# Patient Record
Sex: Female | Born: 1945 | Race: White | Hispanic: No | Marital: Married | Smoking: Former smoker
Health system: Southern US, Community
[De-identification: ages and names within clinical notes are randomized; demographics above are authoritative.]

## PROBLEM LIST (undated history)

## (undated) DIAGNOSIS — I4891 Unspecified atrial fibrillation: Secondary | ICD-10-CM

## (undated) DIAGNOSIS — I10 Essential (primary) hypertension: Secondary | ICD-10-CM

## (undated) DIAGNOSIS — D219 Benign neoplasm of connective and other soft tissue, unspecified: Secondary | ICD-10-CM

## (undated) HISTORY — DX: Essential (primary) hypertension: I10

## (undated) HISTORY — PX: DILATION AND CURETTAGE OF UTERUS: SHX78

## (undated) HISTORY — PX: CHOLECYSTECTOMY: SHX55

## (undated) HISTORY — DX: Benign neoplasm of connective and other soft tissue, unspecified: D21.9

## (undated) HISTORY — PX: HYSTEROSCOPY: SHX211

---

## 1999-08-06 ENCOUNTER — Other Ambulatory Visit: Admission: RE | Admit: 1999-08-06 | Discharge: 1999-08-06 | Payer: Self-pay | Admitting: Obstetrics and Gynecology

## 2000-09-06 ENCOUNTER — Ambulatory Visit (HOSPITAL_COMMUNITY): Admission: RE | Admit: 2000-09-06 | Discharge: 2000-09-06 | Payer: Self-pay | Admitting: Obstetrics and Gynecology

## 2000-09-06 ENCOUNTER — Other Ambulatory Visit: Admission: RE | Admit: 2000-09-06 | Discharge: 2000-09-06 | Payer: Self-pay | Admitting: Obstetrics and Gynecology

## 2000-09-06 ENCOUNTER — Encounter: Payer: Self-pay | Admitting: Obstetrics and Gynecology

## 2001-09-11 ENCOUNTER — Other Ambulatory Visit: Admission: RE | Admit: 2001-09-11 | Discharge: 2001-09-11 | Payer: Self-pay | Admitting: Obstetrics and Gynecology

## 2001-09-11 ENCOUNTER — Ambulatory Visit (HOSPITAL_COMMUNITY): Admission: RE | Admit: 2001-09-11 | Discharge: 2001-09-11 | Payer: Self-pay | Admitting: Obstetrics and Gynecology

## 2001-09-11 ENCOUNTER — Encounter: Payer: Self-pay | Admitting: Obstetrics and Gynecology

## 2001-11-01 ENCOUNTER — Encounter: Payer: Self-pay | Admitting: Obstetrics and Gynecology

## 2001-11-01 ENCOUNTER — Encounter: Admission: RE | Admit: 2001-11-01 | Discharge: 2001-11-01 | Payer: Self-pay | Admitting: Obstetrics and Gynecology

## 2002-06-06 ENCOUNTER — Encounter (INDEPENDENT_AMBULATORY_CARE_PROVIDER_SITE_OTHER): Payer: Self-pay

## 2002-06-06 ENCOUNTER — Ambulatory Visit (HOSPITAL_COMMUNITY): Admission: RE | Admit: 2002-06-06 | Discharge: 2002-06-06 | Payer: Self-pay | Admitting: Obstetrics and Gynecology

## 2002-09-11 ENCOUNTER — Ambulatory Visit (HOSPITAL_COMMUNITY): Admission: RE | Admit: 2002-09-11 | Discharge: 2002-09-11 | Payer: Self-pay | Admitting: Obstetrics and Gynecology

## 2002-09-11 ENCOUNTER — Other Ambulatory Visit: Admission: RE | Admit: 2002-09-11 | Discharge: 2002-09-11 | Payer: Self-pay | Admitting: Obstetrics and Gynecology

## 2002-09-11 ENCOUNTER — Encounter: Payer: Self-pay | Admitting: Obstetrics and Gynecology

## 2004-01-12 ENCOUNTER — Other Ambulatory Visit: Admission: RE | Admit: 2004-01-12 | Discharge: 2004-01-12 | Payer: Self-pay | Admitting: Obstetrics and Gynecology

## 2004-01-12 ENCOUNTER — Ambulatory Visit (HOSPITAL_COMMUNITY): Admission: RE | Admit: 2004-01-12 | Discharge: 2004-01-12 | Payer: Self-pay | Admitting: Obstetrics and Gynecology

## 2005-01-17 ENCOUNTER — Ambulatory Visit (HOSPITAL_COMMUNITY): Admission: RE | Admit: 2005-01-17 | Discharge: 2005-01-17 | Payer: Self-pay | Admitting: Obstetrics and Gynecology

## 2005-01-17 ENCOUNTER — Other Ambulatory Visit: Admission: RE | Admit: 2005-01-17 | Discharge: 2005-01-17 | Payer: Self-pay | Admitting: Obstetrics and Gynecology

## 2006-01-18 ENCOUNTER — Other Ambulatory Visit: Admission: RE | Admit: 2006-01-18 | Discharge: 2006-01-18 | Payer: Self-pay | Admitting: Obstetrics and Gynecology

## 2006-01-18 ENCOUNTER — Ambulatory Visit (HOSPITAL_COMMUNITY): Admission: RE | Admit: 2006-01-18 | Discharge: 2006-01-18 | Payer: Self-pay | Admitting: Obstetrics and Gynecology

## 2007-01-23 ENCOUNTER — Ambulatory Visit (HOSPITAL_COMMUNITY): Admission: RE | Admit: 2007-01-23 | Discharge: 2007-01-23 | Payer: Self-pay | Admitting: Obstetrics and Gynecology

## 2007-01-23 ENCOUNTER — Other Ambulatory Visit: Admission: RE | Admit: 2007-01-23 | Discharge: 2007-01-23 | Payer: Self-pay | Admitting: Obstetrics and Gynecology

## 2008-02-04 ENCOUNTER — Other Ambulatory Visit: Admission: RE | Admit: 2008-02-04 | Discharge: 2008-02-04 | Payer: Self-pay | Admitting: Obstetrics and Gynecology

## 2008-02-04 ENCOUNTER — Ambulatory Visit (HOSPITAL_COMMUNITY): Admission: RE | Admit: 2008-02-04 | Discharge: 2008-02-04 | Payer: Self-pay | Admitting: Obstetrics and Gynecology

## 2009-02-04 ENCOUNTER — Ambulatory Visit: Payer: Self-pay | Admitting: Obstetrics and Gynecology

## 2009-02-04 ENCOUNTER — Ambulatory Visit (HOSPITAL_COMMUNITY): Admission: RE | Admit: 2009-02-04 | Discharge: 2009-02-04 | Payer: Self-pay | Admitting: Obstetrics and Gynecology

## 2009-02-04 ENCOUNTER — Other Ambulatory Visit: Admission: RE | Admit: 2009-02-04 | Discharge: 2009-02-04 | Payer: Self-pay | Admitting: Obstetrics and Gynecology

## 2009-02-04 ENCOUNTER — Encounter: Payer: Self-pay | Admitting: Obstetrics and Gynecology

## 2009-02-09 ENCOUNTER — Ambulatory Visit: Payer: Self-pay | Admitting: Obstetrics and Gynecology

## 2009-03-04 ENCOUNTER — Ambulatory Visit: Payer: Self-pay | Admitting: Obstetrics and Gynecology

## 2010-02-15 ENCOUNTER — Ambulatory Visit (HOSPITAL_COMMUNITY): Admission: RE | Admit: 2010-02-15 | Discharge: 2010-02-15 | Payer: Self-pay | Admitting: Obstetrics and Gynecology

## 2010-02-15 ENCOUNTER — Other Ambulatory Visit: Admission: RE | Admit: 2010-02-15 | Discharge: 2010-02-15 | Payer: Self-pay | Admitting: Obstetrics and Gynecology

## 2010-02-15 ENCOUNTER — Ambulatory Visit: Payer: Self-pay | Admitting: Obstetrics and Gynecology

## 2011-02-10 ENCOUNTER — Other Ambulatory Visit: Payer: Self-pay | Admitting: Obstetrics and Gynecology

## 2011-02-10 DIAGNOSIS — Z1231 Encounter for screening mammogram for malignant neoplasm of breast: Secondary | ICD-10-CM

## 2011-03-08 ENCOUNTER — Encounter (INDEPENDENT_AMBULATORY_CARE_PROVIDER_SITE_OTHER): Payer: 59 | Admitting: Obstetrics and Gynecology

## 2011-03-08 ENCOUNTER — Other Ambulatory Visit: Payer: Self-pay | Admitting: Obstetrics and Gynecology

## 2011-03-08 ENCOUNTER — Ambulatory Visit (HOSPITAL_COMMUNITY)
Admission: RE | Admit: 2011-03-08 | Discharge: 2011-03-08 | Disposition: A | Discharge status: Home / Self Care | Payer: 59 | Source: Ambulatory Visit | Attending: Obstetrics and Gynecology | Admitting: Obstetrics and Gynecology

## 2011-03-08 ENCOUNTER — Other Ambulatory Visit (HOSPITAL_COMMUNITY)
Admission: RE | Admit: 2011-03-08 | Discharge: 2011-03-08 | Disposition: A | Discharge status: Home / Self Care | Payer: 59 | Source: Ambulatory Visit | Attending: Obstetrics and Gynecology | Admitting: Obstetrics and Gynecology

## 2011-03-08 DIAGNOSIS — Z1231 Encounter for screening mammogram for malignant neoplasm of breast: Secondary | ICD-10-CM

## 2011-03-08 DIAGNOSIS — Z01419 Encounter for gynecological examination (general) (routine) without abnormal findings: Secondary | ICD-10-CM

## 2011-03-08 DIAGNOSIS — Z124 Encounter for screening for malignant neoplasm of cervix: Secondary | ICD-10-CM | POA: Insufficient documentation

## 2011-03-08 DIAGNOSIS — N644 Mastodynia: Secondary | ICD-10-CM

## 2011-03-09 ENCOUNTER — Ambulatory Visit
Admission: RE | Admit: 2011-03-09 | Discharge: 2011-03-09 | Disposition: A | Discharge status: Home / Self Care | Payer: 59 | Source: Ambulatory Visit | Attending: Obstetrics and Gynecology | Admitting: Obstetrics and Gynecology

## 2011-03-09 DIAGNOSIS — N644 Mastodynia: Secondary | ICD-10-CM

## 2011-04-08 NOTE — Op Note (Signed)
[facility]  Patient:    Kristina Martin, Kristina Martin Visit Number: [id number] MRN: [id number]          Service Type: DSU Location: [facility] Attending Physician:  Sharon Mt Dictated by:   Rande Brunt. Eda Paschal, M.D. Proc. Date: 06/06/02 Admit Date:  06/06/2002 Discharge Date: 06/06/2002                             Operative Report  PREOPERATIVE DIAGNOSIS:       Dysfunctional uterine bleeding, leiomyomata uteri.  POSTOPERATIVE DIAGNOSIS:      Dysfunctional uterine bleeding, leiomyomata uteri.  OPERATION:                    Hysteroscopy D&C.  SURGEON:                      Daniel L. Eda Paschal, M.D.  ANESTHESIA:                   General.  INDICATIONS:                  The patient is a 65 year old female who has continued to have menometrorrhagia.  She has been treated multiple times with Megace, with only short-term control.  She will hemorrhage for up 4-5 weeks with this.  She has become anemic as a result of this.  She is a smoker and therefore cannot take oral contraceptives.  She enters now for hysteroscopy D&C, with hopes of controlling the above.  FINDINGS:                     External vaginal exam is within normal limits. Cervix is clean.  Uterus is enlarged by almost 10-11 weeks size.  She has no significant decensus.  Adnexa are palpably normal.  At the time of hysteroscopic examination, there was no intrauterine cavitary defect.  At the top of the fundus you could see where one of the myomas is putting pressure on the endometrial cavity; but, there is not a true submucous myoma from the standpoint of it entering the cavity.  PROCEDURE:                    After adequate general anesthesia, the patient was placed in the dorsal lithotomy position; prepped and draped in the usual sterile manner.  A single-tooth tenaculum was placed in the anterior lip of the cervix.  The cervix was dilated to a #33 Pratt dilator.  A hysteroscopic examination  was done with the hysteroscopic resectoscope, using 3% Sorbitol to expand the intrauterine cavity, and then a camera for magnification.  The cavity was seen and there was no intracavitary pathology.  After this, an endometrial curettage was done, with just moderate tissue removed.  It was sent to pathology with tissue diagnosis.  The patient was re-hysteroscoped to be sure that all was okay, and it was.  Pictures were taken for documentation.  FLUID DEFICIT:                250 cc.  BLOOD LOSS:                   Less than 100 cc.  DISPOSITION:                  The patient tolerated the procedure well and left the operating room in satisfactory condition. Dictated by:  Daniel L. Eda Paschal, M.D. Attending Physician:  Sharon Mt DD:  06/06/02 TD:  06/11/02 Job: [phone] [id number]

## 2011-04-19 DIAGNOSIS — K921 Melena: Secondary | ICD-10-CM

## 2012-03-28 ENCOUNTER — Other Ambulatory Visit: Payer: Self-pay | Admitting: Obstetrics and Gynecology

## 2012-03-28 DIAGNOSIS — Z1231 Encounter for screening mammogram for malignant neoplasm of breast: Secondary | ICD-10-CM

## 2012-04-02 ENCOUNTER — Encounter: Payer: Self-pay | Admitting: Gynecology

## 2012-04-02 DIAGNOSIS — I10 Essential (primary) hypertension: Secondary | ICD-10-CM | POA: Insufficient documentation

## 2012-04-02 DIAGNOSIS — D219 Benign neoplasm of connective and other soft tissue, unspecified: Secondary | ICD-10-CM | POA: Insufficient documentation

## 2012-04-11 ENCOUNTER — Ambulatory Visit (INDEPENDENT_AMBULATORY_CARE_PROVIDER_SITE_OTHER): Payer: Medicare Other | Admitting: Obstetrics and Gynecology

## 2012-04-11 ENCOUNTER — Encounter: Payer: Self-pay | Admitting: Obstetrics and Gynecology

## 2012-04-11 VITALS — BP 124/76 | Ht 60.5 in | Wt 137.0 lb

## 2012-04-11 DIAGNOSIS — N952 Postmenopausal atrophic vaginitis: Secondary | ICD-10-CM

## 2012-04-11 DIAGNOSIS — D219 Benign neoplasm of connective and other soft tissue, unspecified: Secondary | ICD-10-CM

## 2012-04-11 DIAGNOSIS — K625 Hemorrhage of anus and rectum: Secondary | ICD-10-CM

## 2012-04-11 DIAGNOSIS — D259 Leiomyoma of uterus, unspecified: Secondary | ICD-10-CM

## 2012-04-11 DIAGNOSIS — R350 Frequency of micturition: Secondary | ICD-10-CM

## 2012-04-11 DIAGNOSIS — K432 Incisional hernia without obstruction or gangrene: Secondary | ICD-10-CM

## 2012-04-11 NOTE — Patient Instructions (Signed)
Schedule colonoscopy. Schedule mammogram.

## 2012-04-11 NOTE — Progress Notes (Signed)
Patient came back to see me today for further followup. We are watching her with fibroids which have been as large as 12 week size. Now that she is menopausal she is having no pain or abnormal bleeding as she was previously. She was previously having menorrhagia with anemia. She is having some vaginal dryness but uses a lubricant with intercourse and does not feel she needs vaginal estrogen. Last year her Hemoccult stools test was positive for blood and I asked her  to go see Dr. Marina Goodell for colonoscopy. She never went because she was afraid she has cancer. She has not seen any rectal bleeding grossly. She is still aware of her incisional hernia but does not want it fixed. She had a normal bone density in March of 2010. She is due for her yearly mammogram. She is having urinary frequency without dysuria, urgency, or incontinence. She is having no dyspareunia.  ROS: 12 system review done. Pertinent positives above. Only other positives hypertension.  Physical examination: Selena Batten gardner present. HEENT within normal limits. Neck: Thyroid not large. No masses. Supraclavicular nodes: not enlarged. Breasts: Examined in both sitting and lying  position. No skin changes and no masses. Abdomen: Soft no guarding rebound or masses. Incisional hernia at site of gallbladder surgery. Pelvic: External: Within normal limits. BUS: Within normal limits. Vaginal:within normal limits. poor estrogen effect. No evidence of cystocele rectocele or enterocele. Cervix: clean. Uterus: Normal size and shape. Adnexa: No masses. Rectovaginal exam: Confirmatory and negative. Extremities: Within normal limits.  Assessment: #1. Incisional hernia #2. Microscopic blood in stools #3. Fibroids resolved #4. Atrophic vaginitis  Plan: Mammogram. Once again strongly urged her to get a colonoscopy. Discussed precancerous polyps, colon cancer. Call when necessary need for vaginal estrogen. Discussed incarcerated hernias and suggested she get a  surgical opinion.

## 2012-04-12 LAB — URINALYSIS W MICROSCOPIC + REFLEX CULTURE
Bilirubin Urine: NEGATIVE
Casts: NONE SEEN
Crystals: NONE SEEN
Ketones, ur: NEGATIVE mg/dL
Squamous Epithelial / LPF: NONE SEEN
Urobilinogen, UA: 0.2 mg/dL (ref 0.0–1.0)
pH: 6.5 (ref 5.0–8.0)

## 2012-04-23 ENCOUNTER — Ambulatory Visit (HOSPITAL_COMMUNITY): Payer: 59

## 2012-04-30 ENCOUNTER — Ambulatory Visit (HOSPITAL_COMMUNITY): Payer: 59

## 2012-05-07 ENCOUNTER — Ambulatory Visit (HOSPITAL_COMMUNITY)
Admission: RE | Admit: 2012-05-07 | Discharge: 2012-05-07 | Disposition: A | Discharge status: Home / Self Care | Payer: Medicare Other | Source: Ambulatory Visit | Attending: Obstetrics and Gynecology | Admitting: Obstetrics and Gynecology

## 2012-05-07 DIAGNOSIS — Z1231 Encounter for screening mammogram for malignant neoplasm of breast: Secondary | ICD-10-CM | POA: Insufficient documentation

## 2013-04-30 ENCOUNTER — Other Ambulatory Visit: Payer: Self-pay | Admitting: Women's Health

## 2013-04-30 DIAGNOSIS — Z1231 Encounter for screening mammogram for malignant neoplasm of breast: Secondary | ICD-10-CM

## 2013-05-15 ENCOUNTER — Encounter: Payer: Self-pay | Admitting: Women's Health

## 2013-05-15 ENCOUNTER — Ambulatory Visit (INDEPENDENT_AMBULATORY_CARE_PROVIDER_SITE_OTHER): Payer: Medicare Other | Admitting: Women's Health

## 2013-05-15 ENCOUNTER — Other Ambulatory Visit (HOSPITAL_COMMUNITY)
Admission: RE | Admit: 2013-05-15 | Discharge: 2013-05-15 | Disposition: A | Discharge status: Home / Self Care | Payer: Medicare Other | Source: Ambulatory Visit | Attending: Gynecology | Admitting: Gynecology

## 2013-05-15 ENCOUNTER — Ambulatory Visit (HOSPITAL_COMMUNITY)
Admission: RE | Admit: 2013-05-15 | Discharge: 2013-05-15 | Disposition: A | Discharge status: Home / Self Care | Payer: Medicare Other | Source: Ambulatory Visit | Attending: Women's Health | Admitting: Women's Health

## 2013-05-15 VITALS — BP 124/80 | Ht 62.0 in | Wt 132.0 lb

## 2013-05-15 DIAGNOSIS — Z124 Encounter for screening for malignant neoplasm of cervix: Secondary | ICD-10-CM | POA: Insufficient documentation

## 2013-05-15 DIAGNOSIS — M949 Disorder of cartilage, unspecified: Secondary | ICD-10-CM

## 2013-05-15 DIAGNOSIS — R350 Frequency of micturition: Secondary | ICD-10-CM

## 2013-05-15 DIAGNOSIS — M899 Disorder of bone, unspecified: Secondary | ICD-10-CM

## 2013-05-15 DIAGNOSIS — Z1231 Encounter for screening mammogram for malignant neoplasm of breast: Secondary | ICD-10-CM | POA: Insufficient documentation

## 2013-05-15 NOTE — Patient Instructions (Addendum)
Health Recommendations for Postmenopausal Women Respected and ongoing research has looked at the most common causes of death, disability, and poor quality of life in postmenopausal women. The causes include heart disease, diseases of blood vessels, diabetes, depression, cancer, and bone loss (osteoporosis). Many things can be done to help lower the chances of developing these and other common problems: CARDIOVASCULAR DISEASE Heart Disease: A heart attack is a medical emergency. Know the signs and symptoms of a heart attack. Below are things women can do to reduce their risk for heart disease.   Do not smoke. If you smoke, quit.  Aim for a healthy weight. Being overweight causes many preventable deaths. Eat a healthy and balanced diet and drink an adequate amount of liquids.  Get moving. Make a commitment to be more physically active. Aim for 30 minutes of activity on most, if not all days of the week.  Eat for heart health. Choose a diet that is low in saturated fat and cholesterol and eliminate trans fat. Include whole grains, vegetables, and fruits. Read and understand the labels on food containers before buying.  Know your numbers. Ask your caregiver to check your blood pressure, cholesterol (total, HDL, LDL, triglycerides) and blood glucose. Work with your caregiver on improving your entire clinical picture.  High blood pressure. Limit or stop your table salt intake (try salt substitute and food seasonings). Avoid salty foods and drinks. Read labels on food containers before buying. Eating well and exercising can help control high blood pressure. STROKE  Stroke is a medical emergency. Stroke may be the result of a blood clot in a blood vessel in the brain or by a brain hemorrhage (bleeding). Know the signs and symptoms of a stroke. To lower the risk of developing a stroke:  Avoid fatty foods.  Quit smoking.  Control your diabetes, blood pressure, and irregular heart rate. THROMBOPHLEBITIS  (BLOOD CLOT) OF THE LEG  Becoming overweight and leading a stationary lifestyle may also contribute to developing blood clots. Controlling your diet and exercising will help lower the risk of developing blood clots. CANCER SCREENING  Breast Cancer: Take steps to reduce your risk of breast cancer.  You should practice "breast self-awareness." This means understanding the normal appearance and feel of your breasts and should include breast self-examination. Any changes detected, no matter how small, should be reported to your caregiver.  After age 40, you should have a clinical breast exam (CBE) every year.  Starting at age 40, you should consider having a mammogram (breast X-ray) every year.  If you have a family history of breast cancer, talk to your caregiver about genetic screening.  If you are at high risk for breast cancer, talk to your caregiver about having an MRI and a mammogram every year.  Intestinal or Stomach Cancer: Tests to consider are a rectal exam, fecal occult blood, sigmoidoscopy, and colonoscopy. Women who are high risk may need to be screened at an earlier age and more often.  Cervical Cancer:  Beginning at age 30, you should have a Pap test every 3 years as long as the past 3 Pap tests have been normal.  If you have had past treatment for cervical cancer or a condition that could lead to cancer, you need Pap tests and screening for cancer for at least 20 years after your treatment.  If you had a hysterectomy for a problem that was not cancer or a condition that could lead to cancer, then you no longer need Pap tests.    If you are between ages 65 and 70, and you have had normal Pap tests going back 10 years, you no longer need Pap tests.  If Pap tests have been discontinued, risk factors (such as a new sexual partner) need to be reassessed to determine if screening should be resumed.  Some medical problems can increase the chance of getting cervical cancer. In these  cases, your caregiver may recommend more frequent screening and Pap tests.  Uterine Cancer: If you have vaginal bleeding after reaching menopause, you should notify your caregiver.  Ovarian cancer: Other than yearly pelvic exams, there are no reliable tests available to screen for ovarian cancer at this time except for yearly pelvic exams.  Lung Cancer: Yearly chest X-rays can detect lung cancer and should be done on high risk women, such as cigarette smokers and women with chronic lung disease (emphysema).  Skin Cancer: A complete body skin exam should be done at your yearly examination. Avoid overexposure to the sun and ultraviolet light lamps. Use a strong sun block cream when in the sun. All of these things are important in lowering the risk of skin cancer. MENOPAUSE Menopause Symptoms: Hormone therapy products are effective for treating symptoms associated with menopause:  Moderate to severe hot flashes.  Night sweats.  Mood swings.  Headaches.  Tiredness.  Loss of sex drive.  Insomnia.  Other symptoms. Hormone replacement carries certain risks, especially in older women. Women who use or are thinking about using estrogen or estrogen with progestin treatments should discuss that with their caregiver. Your caregiver will help you understand the benefits and risks. The ideal dose of hormone replacement therapy is not known. The Food and Drug Administration (FDA) has concluded that hormone therapy should be used only at the lowest doses and for the shortest amount of time to reach treatment goals.  OSTEOPOROSIS Protecting Against Bone Loss and Preventing Fracture: If you use hormone therapy for prevention of bone loss (osteoporosis), the risks for bone loss must outweigh the risk of the therapy. Ask your caregiver about other medications known to be safe and effective for preventing bone loss and fractures. To guard against bone loss or fractures, the following is recommended:  If  you are less than age 50, take 1000 mg of calcium and at least 600 mg of Vitamin D per day.  If you are greater than age 50 but less than age 70, take 1200 mg of calcium and at least 600 mg of Vitamin D per day.  If you are greater than age 70, take 1200 mg of calcium and at least 800 mg of Vitamin D per day. Smoking and excessive alcohol intake increases the risk of osteoporosis. Eat foods rich in calcium and vitamin D and do weight bearing exercises several times a week as your caregiver suggests. DIABETES Diabetes Melitus: If you have Type I or Type 2 diabetes, you should keep your blood sugar under control with diet, exercise and recommended medication. Avoid too many sweets, starchy and fatty foods. Being overweight can make control more difficult. COGNITION AND MEMORY Cognition and Memory: Menopausal hormone therapy is not recommended for the prevention of cognitive disorders such as Alzheimer's disease or memory loss.  DEPRESSION  Depression may occur at any age, but is common in elderly women. The reasons may be because of physical, medical, social (loneliness), or financial problems and needs. If you are experiencing depression because of medical problems and control of symptoms, talk to your caregiver about this. Physical activity and   exercise may help with mood and sleep. Community and volunteer involvement may help your sense of value and worth. If you have depression and you feel that the problem is getting worse or becoming severe, talk to your caregiver about treatment options that are best for you. ACCIDENTS  Accidents are common and can be serious in the elderly woman. Prepare your house to prevent accidents. Eliminate throw rugs, place hand bars in the bath, shower and toilet areas. Avoid wearing high heeled shoes or walking on wet, snowy, and icy areas. Limit or stop driving if you have vision or hearing problems, or you feel you are unsteady with you movements and  reflexes. HEPATITIS C Hepatitis C is a type of viral infection affecting the liver. It is spread mainly through contact with blood from an infected person. It can be treated, but if left untreated, it can lead to severe liver damage over years. Many people who are infected do not know that the virus is in their blood. If you are a "baby-boomer", it is recommended that you have one screening test for Hepatitis C. IMMUNIZATIONS  Several immunizations are important to consider having during your senior years, including:   Tetanus, diptheria, and pertussis booster shot.  Influenza every year before the flu season begins.  Pneumonia vaccine.  Shingles vaccine.  Others as indicated based on your specific needs. Talk to your caregiver about these. Document Released: 12/30/2005 Document Revised: 10/24/2012 Document Reviewed: 08/25/2008 [facility] Patient Information 2014 [place], [place].  Colonoscopy  Please schedule!!!!! Vit D 2000 daily

## 2013-05-15 NOTE — Progress Notes (Signed)
Kristina Martin [birth year] [id number]    History:    The patient presents for breast and pelvic exam. Postmenopausal no bleeding/no HRT. Normal Pap and mammogram history. Normal DEXA 2010. T score -0.7 left hip,  AP spine 1.5. Quit smoking 2 years ago. Had pneumonia vaccine, not zostavac. Has never had a colonoscopy. Labs at primary care. History of fibroids.   Past medical history, past surgical history, family history and social history were all reviewed and documented in the EPIC chart. Retired Psychiatric nurse. Father, brother hypertension, mother osteoporosis. 32 year old grandson living with them. Has 2 living sons. One son drown when he was 84. Hysteroscopy for DUB and fibroid 2003. Cholecystectomy in 74, 1 C-section last delivery.  Exam:  Filed Vitals:   05/15/13 1044  BP: 124/80    General appearance:  Normal Head/Neck:  Normal, without cervical or supraclavicular adenopathy. Thyroid:  Symmetrical, normal in size, without palpable masses or nodularity. Respiratory  Effort:  Normal  Auscultation:  Clear without wheezing or rhonchi Cardiovascular  Auscultation:  Regular rate, without rubs, murmurs or gallops  Edema/varicosities:  Not grossly evident Abdominal  Soft,nontender, without masses, guarding or rebound.  Liver/spleen:  No organomegaly noted  Hernia:  None appreciated  Skin  Inspection:  Grossly normal  Palpation:  Grossly normal Neurologic/psychiatric  Orientation:  Normal with appropriate conversation.  Mood/affect:  Normal  Genitourinary    Breasts: Examined lying and sitting.     Right: Without masses, retractions, discharge or axillary adenopathy.     Left: Without masses, retractions, discharge or axillary adenopathy.   Inguinal/mons:  Normal without inguinal adenopathy  External genitalia:  Normal  BUS/Urethra/Skene's glands:  Normal  Bladder:  Normal  Vagina:  Normal  Cervix:  Normal  Uterus:   normal in size, shape and contour.  Midline and  mobile  Adnexa/parametria:     Rt: Without masses or tenderness.   Lt: Without masses or tenderness.  Anus and perineum: Normal  Digital rectal exam: Normal sphincter tone without palpated masses or tenderness  Assessment/Plan:  67 y.o. M. WF G3 P2 for breast and pelvic exam.  Hypertension-primary care manages labs and meds Normal postmenopausal exam on no HRT/no bleeding  Plan: Bone density, will schedule here. Reviewed importance of increasing regular exercise, vitamin D 2000 daily, home safety and fall prevention discussed. Pap, Pap normal 2012, new screening guidelines reviewed.SBE's, continue annual mammogram. Reviewed importance of screening colonoscopy, husband has seen Dr. Juanda Chance in the past, will schedule. Encouraged zostavac. Has had some increased urgency with frequency and without burning or pain. UA pending.    Harrington Challenger WHNP, 11:18 AM 05/15/2013

## 2013-05-15 NOTE — Addendum Note (Signed)
Addended by: Richardson Chiquito on: 05/15/2013 12:25 PM   Modules accepted: Orders

## 2013-05-16 ENCOUNTER — Encounter: Payer: Self-pay | Admitting: Obstetrics and Gynecology

## 2013-05-16 LAB — URINALYSIS W MICROSCOPIC + REFLEX CULTURE
Hgb urine dipstick: NEGATIVE
Protein, ur: NEGATIVE mg/dL
pH: 7 (ref 5.0–8.0)

## 2013-05-17 LAB — URINE CULTURE

## 2013-05-22 ENCOUNTER — Other Ambulatory Visit: Payer: Self-pay | Admitting: Gynecology

## 2013-05-22 DIAGNOSIS — M949 Disorder of cartilage, unspecified: Secondary | ICD-10-CM

## 2013-06-20 ENCOUNTER — Ambulatory Visit (INDEPENDENT_AMBULATORY_CARE_PROVIDER_SITE_OTHER): Payer: Medicare Other

## 2013-06-20 DIAGNOSIS — M8580 Other specified disorders of bone density and structure, unspecified site: Secondary | ICD-10-CM

## 2013-06-20 DIAGNOSIS — M949 Disorder of cartilage, unspecified: Secondary | ICD-10-CM

## 2013-06-20 DIAGNOSIS — M899 Disorder of bone, unspecified: Secondary | ICD-10-CM

## 2014-09-22 ENCOUNTER — Encounter: Payer: Self-pay | Admitting: Women's Health

## 2016-05-30 DIAGNOSIS — Z0000 Encounter for general adult medical examination without abnormal findings: Secondary | ICD-10-CM | POA: Diagnosis not present

## 2016-05-30 DIAGNOSIS — R509 Fever, unspecified: Secondary | ICD-10-CM | POA: Diagnosis not present

## 2016-06-02 DIAGNOSIS — B349 Viral infection, unspecified: Secondary | ICD-10-CM | POA: Diagnosis not present

## 2016-11-01 DIAGNOSIS — R5383 Other fatigue: Secondary | ICD-10-CM | POA: Diagnosis not present

## 2016-11-01 DIAGNOSIS — I10 Essential (primary) hypertension: Secondary | ICD-10-CM | POA: Diagnosis not present

## 2016-11-01 DIAGNOSIS — Z1211 Encounter for screening for malignant neoplasm of colon: Secondary | ICD-10-CM | POA: Diagnosis not present

## 2016-11-01 DIAGNOSIS — R7301 Impaired fasting glucose: Secondary | ICD-10-CM | POA: Diagnosis not present

## 2016-11-01 DIAGNOSIS — Z0000 Encounter for general adult medical examination without abnormal findings: Secondary | ICD-10-CM | POA: Diagnosis not present

## 2016-11-01 DIAGNOSIS — Z23 Encounter for immunization: Secondary | ICD-10-CM | POA: Diagnosis not present

## 2017-11-16 DIAGNOSIS — Z23 Encounter for immunization: Secondary | ICD-10-CM | POA: Diagnosis not present

## 2018-02-08 DIAGNOSIS — H40033 Anatomical narrow angle, bilateral: Secondary | ICD-10-CM | POA: Diagnosis not present

## 2018-02-08 DIAGNOSIS — H35341 Macular cyst, hole, or pseudohole, right eye: Secondary | ICD-10-CM | POA: Diagnosis not present

## 2018-02-08 DIAGNOSIS — H40013 Open angle with borderline findings, low risk, bilateral: Secondary | ICD-10-CM | POA: Diagnosis not present

## 2018-02-08 DIAGNOSIS — H25043 Posterior subcapsular polar age-related cataract, bilateral: Secondary | ICD-10-CM | POA: Diagnosis not present

## 2018-02-08 DIAGNOSIS — H2513 Age-related nuclear cataract, bilateral: Secondary | ICD-10-CM | POA: Diagnosis not present

## 2018-02-08 DIAGNOSIS — H25013 Cortical age-related cataract, bilateral: Secondary | ICD-10-CM | POA: Diagnosis not present

## 2018-05-30 DIAGNOSIS — R7301 Impaired fasting glucose: Secondary | ICD-10-CM | POA: Diagnosis not present

## 2018-05-30 DIAGNOSIS — I10 Essential (primary) hypertension: Secondary | ICD-10-CM | POA: Diagnosis not present

## 2018-05-30 DIAGNOSIS — E785 Hyperlipidemia, unspecified: Secondary | ICD-10-CM | POA: Diagnosis not present

## 2018-12-14 ENCOUNTER — Other Ambulatory Visit: Payer: Self-pay

## 2018-12-14 ENCOUNTER — Ambulatory Visit (HOSPITAL_COMMUNITY)
Admission: EM | Admit: 2018-12-14 | Discharge: 2018-12-14 | Disposition: A | Discharge status: Home / Self Care | Payer: PPO

## 2018-12-14 ENCOUNTER — Emergency Department (HOSPITAL_COMMUNITY): Payer: PPO

## 2018-12-14 ENCOUNTER — Emergency Department (HOSPITAL_COMMUNITY)
Admission: EM | Admit: 2018-12-14 | Discharge: 2018-12-15 | Disposition: A | Discharge status: Home / Self Care | Payer: PPO | Attending: Emergency Medicine | Admitting: Emergency Medicine

## 2018-12-14 ENCOUNTER — Encounter (HOSPITAL_COMMUNITY): Payer: Self-pay

## 2018-12-14 DIAGNOSIS — H1031 Unspecified acute conjunctivitis, right eye: Secondary | ICD-10-CM | POA: Diagnosis not present

## 2018-12-14 DIAGNOSIS — S0003XA Contusion of scalp, initial encounter: Secondary | ICD-10-CM | POA: Insufficient documentation

## 2018-12-14 DIAGNOSIS — Z7982 Long term (current) use of aspirin: Secondary | ICD-10-CM | POA: Diagnosis not present

## 2018-12-14 DIAGNOSIS — W19XXXA Unspecified fall, initial encounter: Secondary | ICD-10-CM

## 2018-12-14 DIAGNOSIS — Z79899 Other long term (current) drug therapy: Secondary | ICD-10-CM | POA: Insufficient documentation

## 2018-12-14 DIAGNOSIS — Y92000 Kitchen of unspecified non-institutional (private) residence as  the place of occurrence of the external cause: Secondary | ICD-10-CM | POA: Diagnosis not present

## 2018-12-14 DIAGNOSIS — Y999 Unspecified external cause status: Secondary | ICD-10-CM | POA: Diagnosis not present

## 2018-12-14 DIAGNOSIS — Z87891 Personal history of nicotine dependence: Secondary | ICD-10-CM | POA: Diagnosis not present

## 2018-12-14 DIAGNOSIS — Y9389 Activity, other specified: Secondary | ICD-10-CM | POA: Insufficient documentation

## 2018-12-14 DIAGNOSIS — I10 Essential (primary) hypertension: Secondary | ICD-10-CM | POA: Insufficient documentation

## 2018-12-14 DIAGNOSIS — S0990XA Unspecified injury of head, initial encounter: Secondary | ICD-10-CM | POA: Diagnosis not present

## 2018-12-14 DIAGNOSIS — W01198A Fall on same level from slipping, tripping and stumbling with subsequent striking against other object, initial encounter: Secondary | ICD-10-CM | POA: Insufficient documentation

## 2018-12-14 MED ORDER — FLUORESCEIN SODIUM 1 MG OP STRP
1.0000 | ORAL_STRIP | Freq: Once | OPHTHALMIC | Status: DC
  Filled 2018-12-14: qty 1

## 2018-12-14 NOTE — ED Provider Notes (Signed)
[facility]ENC[facility] DEPARTMENT Provider Note   CSN: [id number] Arrival date & time: 12/14/18  1747   History   Chief Complaint Chief Complaint  Patient presents with  . Fall    HPI Kristina Martin is a 73 y.o. female.  HPI   Kristina Martin is a 73 y.o. female with PMH of fibroids and hypertension who presents with concern for swelling and color change of the face after fall at home 2 days ago.  Patient reports that she went to kick a bottle that was on the ground out of her way in the kitchen and then fell forward striking her head on the sink.  States that she only hit her right frontal forehead on the sink no LOC.  No neck pain.  Able to ambulate without difficulty thereafter.  Noticed progressively worsening swelling.  Went to urgent care earlier today where she was evaluated and referred to the emergency department for CT scan.  She reports she has had a headache and has taken roughly 2000 to 3000 mg of Tylenol per day for the past 2 days.  She has had no vision changes or blurry vision.  I was initially swollen shut on the right because of swelling that drained from her upper forehead down but now her eyes open and she has no double vision.  Has noticed a small amount of crusting around her right eye.  Past Medical History:  Diagnosis Date  . Fibroid   . Hypertension     Patient Active Problem List   Diagnosis Date Noted  . Fibroid   . Hypertension     Past Surgical History:  Procedure Laterality Date  . CESAREAN SECTION    . CHOLECYSTECTOMY    . DILATION AND CURETTAGE OF UTERUS    . HYSTEROSCOPY       OB History    Gravida  3   Para  2   Term  2   Preterm      AB  1   Living  2     SAB  1   TAB      Ectopic      Multiple      Live Births               Home Medications    Prior to Admission medications   Medication Sig Start Date End Date Taking? Authorizing Provider  acetaminophen (TYLENOL) 325 MG tablet Take 650-975 mg  by mouth every 4 (four) hours as needed (pain).    Yes [provider]  aspirin 81 MG tablet Take 81 mg by mouth daily.   Yes [provider]  atorvastatin (LIPITOR) 10 MG tablet Take 10 mg by mouth daily.   Yes [provider]  Calcium Carbonate-Vitamin D (CALCIUM + D PO) Take 1 tablet by mouth daily.    Yes [provider]  Cholecalciferol (VITAMIN D PO) Take 1 tablet by mouth daily.    Yes [provider]  lisinopril-hydrochlorothiazide (PRINZIDE,ZESTORETIC) 20-25 MG tablet Take 0.5 tablets by mouth daily.   Yes [provider]  erythromycin ophthalmic ointment Place into the right eye 4 (four) times daily for 7 days. Place a 1/2 inch ribbon of ointment into the lower eyelid. 12/15/18 12/22/18  Yarelly Kuba, Rodena Goldmann, MD    Family History Family History  Problem Relation Age of Onset  . Osteoporosis Mother   . Hypertension Father   . Diabetes Brother   . Hypertension Brother   .  Hypertension Brother   . Heart disease Brother     Social History Social History   Tobacco Use  . Smoking status: Former Research scientist (life sciences)  . Smokeless tobacco: Never Used  Substance Use Topics  . Alcohol use: No  . Drug use: No     Allergies   Patient has no known allergies.   Review of Systems Review of Systems  Constitutional: Negative for chills and fever.  HENT: Positive for facial swelling. Negative for ear pain and sore throat.   Eyes: Positive for discharge and redness. Negative for photophobia, pain, itching and visual disturbance.  Respiratory: Negative for cough and shortness of breath.   Cardiovascular: Negative for chest pain and palpitations.  Gastrointestinal: Negative for abdominal pain and vomiting.  Genitourinary: Negative for dysuria and hematuria.  Musculoskeletal: Negative for arthralgias and back pain.  Skin: Negative for color change and rash.  Neurological: Positive for headaches. Negative for seizures, syncope and weakness.  All  other systems reviewed and are negative.    Physical Exam Updated Vital Signs BP (!) 164/98 (BP Location: Right Arm)   Pulse 80   Temp 98.1 F (36.7 C) (Oral)   Resp 16   SpO2 96%   Physical Exam Vitals signs and nursing note reviewed.  Constitutional:      General: She is not in acute distress.    Appearance: Normal appearance. She is well-developed. She is not ill-appearing.  HENT:     Head: Normocephalic and atraumatic.     Jaw: There is normal jaw occlusion.      Comments: There is scattered ecchymoses about the frontal forehead, periorbital tissues, and maxillary bilaterally.  No tenderness to palpation about the face with exception of overlying hematoma on frontal forehead as above.    Nose: Nose normal.     Mouth/Throat:     Lips: Pink.     Dentition: Normal dentition.  Eyes:     General: Vision grossly intact.     Extraocular Movements: Extraocular movements intact.     Conjunctiva/sclera:     Right eye: Right conjunctiva is injected. No chemosis or hemorrhage.    Pupils: Pupils are equal, round, and reactive to light.     Visual Fields: Right eye visual fields normal and left eye visual fields normal.     Comments: Full range of motion of both eyes.  No evidence for entrapment.  No pain with light reflex.  No amblyopia.  No hyphema or hypopyon.  Small amount of scleral injection and conjunctival erythema in the right eye.  Small to moderate amount of yellow crusting about the lid margins on the right.  Neck:     Musculoskeletal: Full passive range of motion without pain and neck supple. No spinous process tenderness or muscular tenderness.  Cardiovascular:     Rate and Rhythm: Normal rate and regular rhythm.     Heart sounds: No murmur.  Pulmonary:     Effort: Pulmonary effort is normal. No respiratory distress.     Breath sounds: Normal breath sounds.  Abdominal:     Palpations: Abdomen is soft.     Tenderness: There is no abdominal tenderness.  Skin:     General: Skin is warm and dry.  Neurological:     General: No focal deficit present.     Mental Status: She is alert and oriented to person, place, and time.     GCS: GCS eye subscore is 4. GCS verbal subscore is 5. GCS motor subscore is 6.  Cranial Nerves: Cranial nerves are intact.     Sensory: Sensation is intact.     Motor: Motor function is intact.     Coordination: Coordination is intact.  Psychiatric:        Behavior: Behavior is cooperative.      ED Treatments / Results  Labs (all labs ordered are listed, but only abnormal results are displayed) Labs Reviewed - No data to display  EKG None  Radiology Ct Head Wo Contrast  Result Date: 12/14/2018 CLINICAL DATA:  [place] 2 days ago. Facial swelling. EXAM: CT HEAD WITHOUT CONTRAST TECHNIQUE: Contiguous axial images were obtained from the base of the skull through the vertex without intravenous contrast. COMPARISON:  None. FINDINGS: Brain: The brain itself has a normal appearance without evidence of accelerated atrophy, old or acute infarction, mass lesion, hemorrhage, hydrocephalus or extra-axial collection. Vascular: There is atherosclerotic calcification of the major vessels at the base of the brain. Skull: No skull fracture Sinuses/Orbits: Sinuses are clear. Orbits are negative. There is periorbital facial soft tissue swelling right more than left. Other: Right forehead scalp hematoma measuring up to 3 cm. IMPRESSION: No intracranial abnormality. No skull fracture. Right forehead scalp hematoma. Electronically Signed   By: Nelson Chimes M.D.   On: 12/14/2018 18:55    Procedures Procedures (including critical care time)  Medications Ordered in ED Medications  acetaminophen (TYLENOL) tablet 650 mg (has no administration in time range)     Initial Impression / Assessment and Plan / ED Course  I have reviewed the triage vital signs and the nursing notes.  Pertinent labs & imaging results that were available during my care  of the patient were reviewed by me and considered in my medical decision making (see chart for details).      MDM:  Imaging: CT head shows no acute intracranial abnormality.  No skull fracture.  Right forehead scalp hematoma.  ED Provider Interpretation of EKG: None indicated at this time.  Labs: None indicated at this time.  On initial evaluation, patient appears stable. Afebrile and hemodynamically stable. Alert and oriented x4, pleasant, and cooperative.  Presents after a fall at home with her husband at the bedside as detailed above.  On exam, patient has scalp/frontal forehead hematoma with evidence for draining over the midface of this hematoma.  She has no tenderness palpation anywhere with exception of around the area of hematoma.  CT head ordered prior to my evaluation shows no acute pathology other than hematoma as detailed above.  She is not anticoagulated and takes only aspirin daily.  Has had headache at home but has no neurologic deficits at this time.  No evidence for intracranial bleed.  Neurovascularly intact in all 4 extremities.  No evidence for facial trauma otherwise and feel that CT head encompasses the area of her pathology.  Spoke to radiology physician on call who reevaluated CT head without contrast and felt that there was no evidence for pathology other than the hematoma noted to the level of the inferior orbit.  No indication that CT maxillofacial protocol would add anything.  On exam, patient has normal visual acuity and extraocular movements.  No other evidence for trauma to the eye.  She does have conjunctival injection and erythema and some mild periorbital edema on the right that is slightly worse than that on the left.  Suspect early conjunctivitis in the setting of edema and inability to drain.  She has no pain on palpation and no pain with range of motion of  her eyes.  No erythema in the periorbital tissue on the right.  Low suspicion for preseptal cellulitis or  orbital cellulitis.  No evidence for endophthalmitis.  Low suspicion for episcleritis or iritis.  Patient does not wear contact lenses.  No evidence for open globe or muscular/cranial nerve entrapment.  Given prescription for erythromycin ointment.  Patient was given p.o. Tylenol in the ED and counseled extensively on safe prominence of Tylenol in her AVS.  Counseled to follow-up with her PCP for reevaluation and was given a prescription for erythromycin at discharge.  Given return precautions.   The plan for this patient was discussed with Dr. Zenia Resides who voiced agreement and who oversaw evaluation and treatment of this patient.   The patient was fully informed and involved with the history taking, evaluation, workup including labs/images, and plan. The patient's concerns and questions were addressed to the patient's satisfaction and she expressed agreement with the plan to dc home.    Final Clinical Impressions(s) / ED Diagnoses   Final diagnoses:  Injury of head, initial encounter  Fall, initial encounter  Hematoma of frontal scalp, initial encounter  Acute conjunctivitis of right eye, unspecified acute conjunctivitis type    ED Discharge Orders         Ordered    erythromycin ophthalmic ointment  4 times daily     12/15/18 0003           Lorma Heater, Rodena Goldmann, MD 12/15/18 Larena Glassman    Lacretia Leigh, MD 12/17/18 1401

## 2018-12-14 NOTE — ED Triage Notes (Signed)
Pt sent to ER by Dr. Lanny Cramp, pt has significant swelling to face from fall two days ago. Needs CT scan. Pt and family agreeable to plan, left for ER

## 2018-12-14 NOTE — ED Provider Notes (Signed)
I saw and evaluated the patient, reviewed the resident's note and I agree with the findings and plan.  EKG: None Patient here complaining of facial pain and right scalp forehead hematoma after a fall several days ago.  She denies any visual complaints of.  No blurred vision or diplopia.  Had severe periorbital edema and ecchymosis which is resolving.  No drainage from her nose or ears.  Head CT was negative for intracranial process.  Her neurological signs are stable.  She will be discharged home   Lacretia Leigh, MD 12/14/18 2220

## 2018-12-14 NOTE — ED Triage Notes (Signed)
Pt her from urgent care to be seen for swelling and bruising to her face from a fall 2 days ago. A&Ox4 ambulatory with a steady gait.

## 2018-12-15 MED ORDER — ERYTHROMYCIN 5 MG/GM OP OINT: TOPICAL_OINTMENT | Freq: Four times a day (QID) | OPHTHALMIC | 0 refills | Status: AC

## 2018-12-15 MED ORDER — ACETAMINOPHEN 325 MG PO TABS
650.0000 mg | ORAL_TABLET | Freq: Once | ORAL | Status: AC
  Administered 2018-12-15: 650 mg via ORAL
  Filled 2018-12-15: qty 2

## 2018-12-15 NOTE — ED Notes (Signed)
Patient Alert and oriented to baseline. Stable and ambulatory to baseline. Patient verbalized understanding of the discharge instructions.  Patient belongings were taken by the patient.   

## 2018-12-15 NOTE — Discharge Instructions (Addendum)
You may take 500 to 1000 mg of acetaminophen (Tylenol) every 8 hours as needed for pain for the next 2 to 3 days until you are reevaluated by your primary care physician.  Do not exceed this amount.

## 2018-12-19 DIAGNOSIS — I10 Essential (primary) hypertension: Secondary | ICD-10-CM | POA: Diagnosis not present

## 2018-12-19 DIAGNOSIS — R319 Hematuria, unspecified: Secondary | ICD-10-CM | POA: Diagnosis not present

## 2018-12-19 DIAGNOSIS — F17200 Nicotine dependence, unspecified, uncomplicated: Secondary | ICD-10-CM | POA: Diagnosis not present

## 2019-09-26 DIAGNOSIS — H35373 Puckering of macula, bilateral: Secondary | ICD-10-CM | POA: Diagnosis not present

## 2019-09-26 DIAGNOSIS — H2513 Age-related nuclear cataract, bilateral: Secondary | ICD-10-CM | POA: Diagnosis not present

## 2019-09-26 DIAGNOSIS — H40013 Open angle with borderline findings, low risk, bilateral: Secondary | ICD-10-CM | POA: Diagnosis not present

## 2019-09-26 DIAGNOSIS — H40033 Anatomical narrow angle, bilateral: Secondary | ICD-10-CM | POA: Diagnosis not present

## 2019-10-08 NOTE — Progress Notes (Addendum)
[facility] Note  10/09/2019     CHIEF COMPLAINT Patient presents for Retina Evaluation   HISTORY OF PRESENT ILLNESS: Kristina Martin is a 73 y.o. female who presents to the clinic today for:   HPI    Retina Evaluation    In both eyes.  This started 6 months ago.  Associated Symptoms Negative for Flashes, Pain, Fever, Weight Loss, Scalp Tenderness, Redness, Floaters, Distortion, Photophobia, Jaw Claudication, Fatigue, Shoulder/Hip pain, Glare, Blind Spot and Trauma.  Context:  distance vision, mid-range vision and near vision.  Treatments tried include eye drops.  Response to treatment was mild improvement.  I, the attending physician,  performed the HPI with the patient and updated documentation appropriately.          Comments    Referral of Dr. Kathlen Martin for retina eval. 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 Pt states she "cannot read as good as she once did" , denies flashes, floaters and ocular pain. Pt reports she fell January 2020 hitting right side of her head, she developed an infection OD. Pt is using Brimonidine Bid OD.        Last edited by Kristina Caffey, MD on 10/10/2019  5:08 PM. (History)    pt states she was sent here by Dr. 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 brimonidine (ALPHAGAN) 0.2 % ophthalmic solution Place 1 drop into the right eye 2 (two) times daily.   No current facility-administered medications for this visit.  (Ophthalmic Drugs)   Current Outpatient Medications (Other)  Medication Sig  . acetaminophen (TYLENOL) 325 MG tablet Take 650-975 mg by mouth every 4 (four) hours as needed (pain).   Marland Kitchen aspirin 81 MG tablet Take 81 mg by mouth daily.  Marland Kitchen atorvastatin (LIPITOR) 10 MG tablet Take 10 mg by mouth daily.  . Calcium Carbonate-Vitamin D (CALCIUM + D PO) Take 1 tablet by mouth daily.   . Cholecalciferol (VITAMIN D PO) Take 1 tablet by mouth daily.   Marland Kitchen lisinopril-hydrochlorothiazide (PRINZIDE,ZESTORETIC) 20-25 MG tablet Take 0.5 tablets by mouth daily.   No current facility-administered medications for this visit.  (Other)      REVIEW OF SYSTEMS: ROS    Positive for: Eyes   Negative for: Constitutional, Gastrointestinal, Neurological, Skin, Genitourinary, Musculoskeletal, HENT, Endocrine, Cardiovascular, Respiratory, Psychiatric, Allergic/Imm, Heme/Lymph   Last edited by Kristina Jordan, LPN on 075-GRM  QA348G PM. (History)       ALLERGIES No Known Allergies  PAST MEDICAL HISTORY Past Medical History:  Diagnosis Date  . Fibroid   . Hypertension    Past Surgical History:  Procedure Laterality Date  . CESAREAN SECTION    . CHOLECYSTECTOMY    . DILATION AND CURETTAGE  OF UTERUS    . HYSTEROSCOPY      FAMILY HISTORY Family History  Problem Relation Age of Onset  . Osteoporosis Mother   . Hypertension Father   . Diabetes Brother   . Hypertension Brother   . Hypertension Brother   . Heart disease Brother     SOCIAL HISTORY Social History   Tobacco Use  . Smoking status: Current Every Day Smoker    Types: Cigarettes  . Smokeless tobacco: Never Used  Substance Use Topics  . Alcohol use: No  . Drug use: No         OPHTHALMIC EXAM:  Base Eye Exam    Visual  Acuity (Snellen - Linear)      Right Left   Dist cc 20/40 -1 20/60   Dist ph cc NI 20/50 -2   Correction: Glasses       Tonometry (Tonopen, 2:21 PM)      Right Left   Pressure 22 14       Pupils      Dark Light Shape React APD   Right 3 2 Round Brisk None   Left 3 2 Round Brisk None       Visual Fields (Counting fingers)      Left Right    Full Full       Extraocular Movement      Right Left    Full, Ortho Full, Ortho       Neuro/Psych    Oriented x3: Yes       Dilation    Both eyes: 1.0% Mydriacyl, 2.5% Phenylephrine @ 2:17 PM        Slit Lamp and Fundus Exam    Slit Lamp Exam      Right Left   Lids/Lashes Dermatochalasis - upper lid, Meibomian gland dysfunction Dermatochalasis - upper lid, Meibomian gland dysfunction   Conjunctiva/Sclera White and quiet White and quiet   Cornea Debris in tear film, Arcus Debris in tear film, Arcus   Anterior Chamber Deep and quiet Deep and quiet   Iris Round and dilated Round and dilated   Lens 3+ Nuclear sclerosis with brunescence, 2-3+ Cortical cataract, 1+ Posterior subcapsular cataract 3+ Nuclear sclerosis with brunescence, 3+ Cortical cataract, 1+ Posterior subcapsular cataract   Vitreous Vitreous syneresis Vitreous syneresis       Fundus Exam      Right Left   Disc Pink and Sharp, temporal PPP Pink and Sharp, temporal PPP   C/D Ratio 0.4 0.4   Macula Blunted foveal reflex, Epiretinal membrane, Retinal pigment epithelial mottling, central Cystic changes Blunted foveal reflex, +Epiretinal membrane with mild striae, Retinal pigment epithelial mottling, no heme   Vessels Vascular attenuation, Tortuous Vascular attenuation, Tortuous, AV crossing changes   Periphery Attached, mild, scattered peripheral drusen, pigmented CR scar at 1200, No RT/RD Attached             IMAGING AND PROCEDURES  Imaging and Procedures for @TODAY @  OCT, Retina - OU - Both Eyes       Right Eye Quality was good. Central Foveal Thickness:  432. Progression has no prior data. Findings include abnormal foveal contour, intraretinal fluid, epiretinal membrane, no SRF, macular pucker.   Left Eye Quality was borderline. Central Foveal Thickness: 372. Progression has no prior data. Findings include abnormal foveal contour, epiretinal membrane, macular pucker, no SRF, no IRF.   Notes *Images captured and stored on drive  Diagnosis / Impression:  OD: +ERM with foveoschisis OS: +ERM w/ pucker  Clinical management:  See below  Abbreviations: NFP - Normal foveal profile. CME - cystoid macular edema. PED - pigment epithelial detachment. IRF - intraretinal fluid. SRF - subretinal fluid. EZ - ellipsoid zone. ERM - epiretinal membrane. ORA - outer retinal atrophy. ORT - outer retinal tubulation. SRHM - subretinal hyper-reflective material                 ASSESSMENT/PLAN:    ICD-10-CM   1. Epiretinal membrane (ERM) of both eyes  H35.373   2. Retinal edema  H35.81 OCT, Retina - OU - Both Eyes  3. Essential hypertension  I10   4. Hypertensive retinopathy of both eyes  H35.033   5. 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 Epiretinal membrane, OU  - OD with foveoschisis, BCVA 20/40  - OS with pucker, BCVA 20/50  - The natural history, anatomy, potential for loss of vision, and treatment options including vitrectomy techniques and the complications of endophthalmitis, retinal detachment, vitreous hemorrhage, cataract progression and permanent vision loss discussed with the patient.  - no metamorphopsia  - pt reports decreased vision, but suspect cataracts may be greater source of symptoms than ERM  - may need PPV w/ membrane peel, but recommend cataract surgery first, for both possible visual improvement and improved visualization for PPV, if needed   - clear from a retina standpoint to proceed with cataract surgery when pt and surgeon are ready  - f/u 3  mos -- DFE/OCT  3,4. Hypertensive retinopathy OU  - discussed importance of tight BP control  - monitor  5. Mixed form age related cataract OU  - The symptoms of cataract, surgical options, and treatments and risks were discussed with patient.  - discussed diagnosis and progression  - under the expert management of Dr. Kathlen Martin  - clear from a retina standpoint to proceed with cataract surgery when pt and surgeon are ready  6. Ocular Hypertension   - IOP today: 22 OD, 14 OS  - on brimonidine BID OD only per Dr. Kathlen Martin   Ophthalmic Meds Ordered this visit:  No orders of the defined types were placed in this encounter.      Return in about 3 months (around 01/09/2020) for f/u 3 months ERM OU, DFE, OCT.  There are no Patient Instructions on file for this visit.   Explained the diagnoses, plan, and follow up with the patient and they expressed understanding.  Patient expressed understanding of the importance of proper follow up care.   This document serves as a record of services personally performed by Gardiner Sleeper, MD, PhD. It was created on their behalf by Roselee Nova, COMT. The creation of this record is the provider's dictation and/or activities during the visit.  Electronically signed by: Roselee Nova, COMT 10/10/19 5:19 PM   Gardiner Sleeper, M.D., Ph.D. Diseases & Surgery of the Retina and Vitreous [facility]  I have reviewed the above documentation for accuracy and completeness, and I agree with the above. Gardiner Sleeper, M.D., Ph.D. 10/10/19 5:19 PM    Abbreviations: M myopia (nearsighted); A astigmatism; H hyperopia (farsighted); P presbyopia; Mrx spectacle prescription;  CTL contact lenses; OD right eye; OS left eye; OU both eyes  XT exotropia; ET esotropia; PEK punctate epithelial keratitis; PEE punctate epithelial erosions; DES dry eye syndrome; MGD meibomian gland dysfunction; ATs artificial tears; PFAT's preservative free artificial  tears; [place] nuclear sclerotic cataract; PSC posterior subcapsular cataract; ERM epi-retinal membrane; PVD posterior vitreous detachment; RD retinal detachment; DM diabetes mellitus; DR diabetic retinopathy; NPDR non-proliferative diabetic retinopathy; PDR proliferative diabetic retinopathy; CSME clinically significant macular edema; DME diabetic macular edema; dbh dot blot hemorrhages; CWS cotton wool spot; POAG primary open angle glaucoma; C/D cup-to-disc ratio; HVF humphrey visual field; GVF goldmann visual field; OCT optical coherence tomography; IOP intraocular pressure; BRVO Branch retinal vein occlusion; CRVO central retinal vein occlusion; CRAO central retinal artery occlusion; BRAO branch retinal artery occlusion; RT retinal tear; SB scleral buckle; PPV pars plana vitrectomy; VH Vitreous hemorrhage; PRP panretinal laser photocoagulation; IVK intravitreal kenalog; VMT vitreomacular traction; MH Macular hole;  NVD neovascularization of the disc; NVE neovascularization elsewhere; AREDS age related eye disease study; ARMD age related macular degeneration; POAG primary open angle glaucoma; EBMD epithelial/anterior basement membrane dystrophy; ACIOL anterior chamber intraocular lens; IOL intraocular lens; PCIOL posterior chamber intraocular lens; Phaco/IOL phacoemulsification with intraocular lens placement; [place] photorefractive keratectomy; LASIK laser assisted in situ keratomileusis; HTN hypertension; DM diabetes mellitus; COPD chronic obstructive pulmonary disease

## 2019-10-09 ENCOUNTER — Other Ambulatory Visit: Payer: Self-pay

## 2019-10-09 ENCOUNTER — Encounter (INDEPENDENT_AMBULATORY_CARE_PROVIDER_SITE_OTHER): Payer: Self-pay | Admitting: Ophthalmology

## 2019-10-09 ENCOUNTER — Ambulatory Visit (INDEPENDENT_AMBULATORY_CARE_PROVIDER_SITE_OTHER): Payer: PPO | Admitting: Ophthalmology

## 2019-10-09 DIAGNOSIS — H40051 Ocular hypertension, right eye: Secondary | ICD-10-CM | POA: Diagnosis not present

## 2019-10-09 DIAGNOSIS — H25813 Combined forms of age-related cataract, bilateral: Secondary | ICD-10-CM

## 2019-10-09 DIAGNOSIS — H3581 Retinal edema: Secondary | ICD-10-CM | POA: Diagnosis not present

## 2019-10-09 DIAGNOSIS — H35033 Hypertensive retinopathy, bilateral: Secondary | ICD-10-CM | POA: Diagnosis not present

## 2019-10-09 DIAGNOSIS — H35373 Puckering of macula, bilateral: Secondary | ICD-10-CM | POA: Diagnosis not present

## 2019-10-09 DIAGNOSIS — I10 Essential (primary) hypertension: Secondary | ICD-10-CM | POA: Diagnosis not present

## 2019-10-10 ENCOUNTER — Encounter (INDEPENDENT_AMBULATORY_CARE_PROVIDER_SITE_OTHER): Payer: Self-pay | Admitting: Ophthalmology

## 2020-01-13 ENCOUNTER — Encounter (INDEPENDENT_AMBULATORY_CARE_PROVIDER_SITE_OTHER): Payer: PPO | Admitting: Ophthalmology

## 2020-07-09 DIAGNOSIS — I10 Essential (primary) hypertension: Secondary | ICD-10-CM | POA: Diagnosis not present

## 2020-07-09 DIAGNOSIS — Z1211 Encounter for screening for malignant neoplasm of colon: Secondary | ICD-10-CM | POA: Diagnosis not present

## 2020-09-14 DIAGNOSIS — Z23 Encounter for immunization: Secondary | ICD-10-CM | POA: Diagnosis not present

## 2020-10-05 IMAGING — CT CT HEAD W/O CM
4 series · 15 of 47 positions shown, 17 images · non-contrast
Comparison: None.

CLINICAL DATA: Fell 2 days ago. Facial swelling.

EXAM:
CT HEAD WITHOUT CONTRAST
TECHNIQUE: Contiguous axial images were obtained from the base of the skull
through the vertex without intravenous contrast.

[Series 3: head without · axial · non-contrast · 0.39mm/px · z∈[-41,+69]mm · 7 of 30 slices shown, 9 images]
[im 4/30  brain]
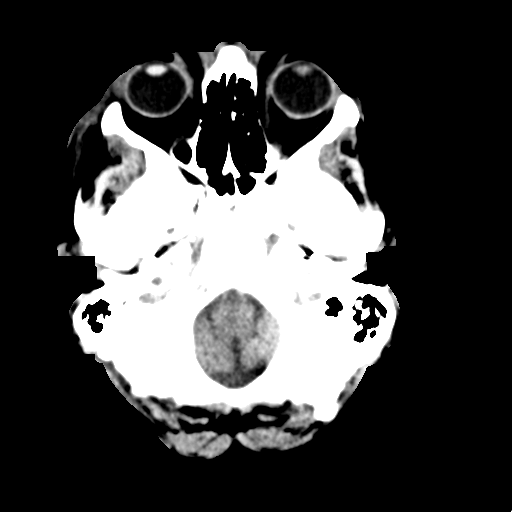
[im 4/30  bone]
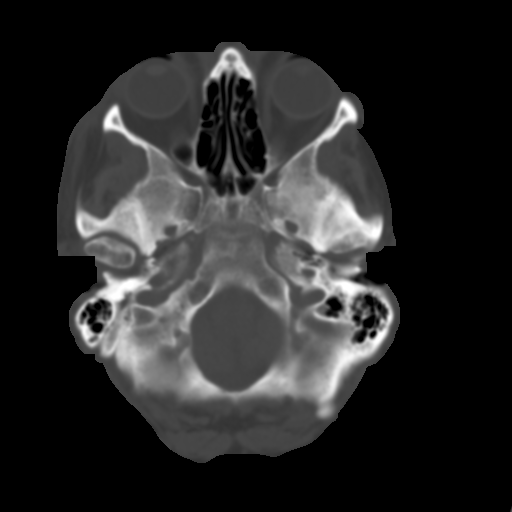
[im 8/30  brain]
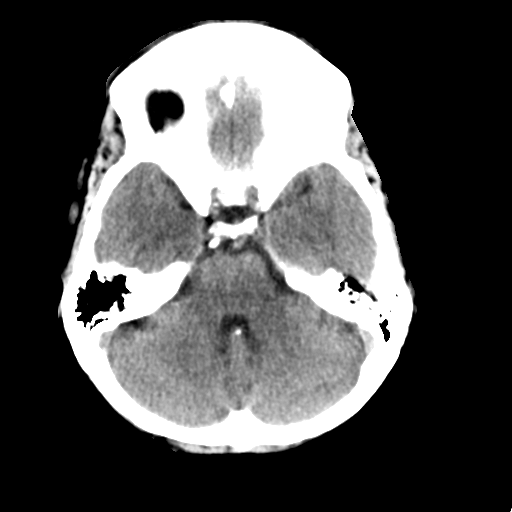
[im 11/30  brain]
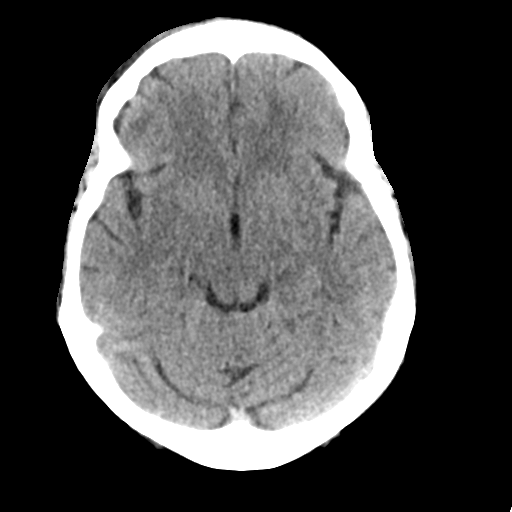
[im 15/30  brain]
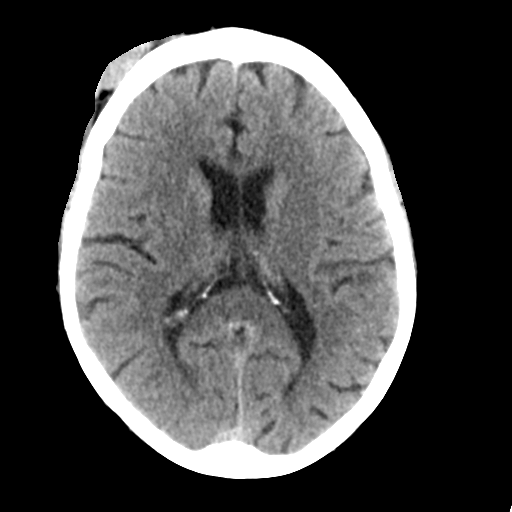
[im 19/30  brain]
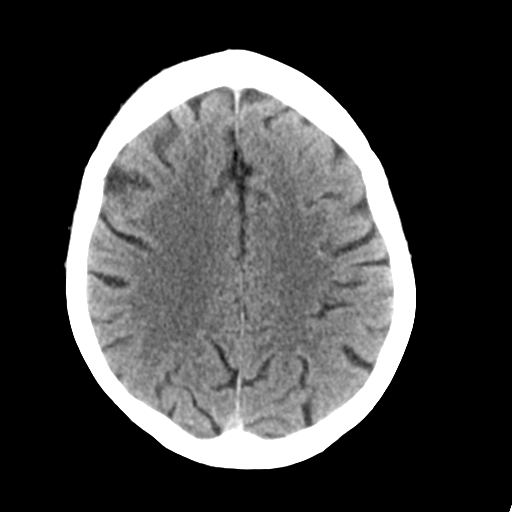
[im 19/30  bone]
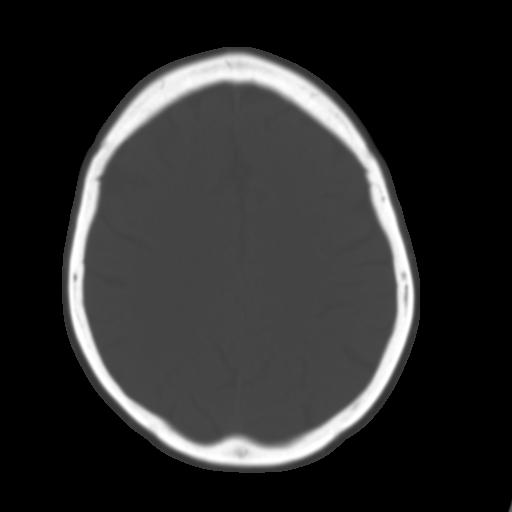
[im 22/30  brain]
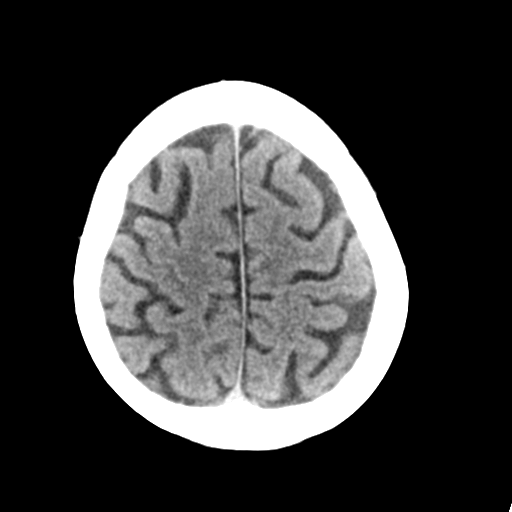
[im 26/30  brain]
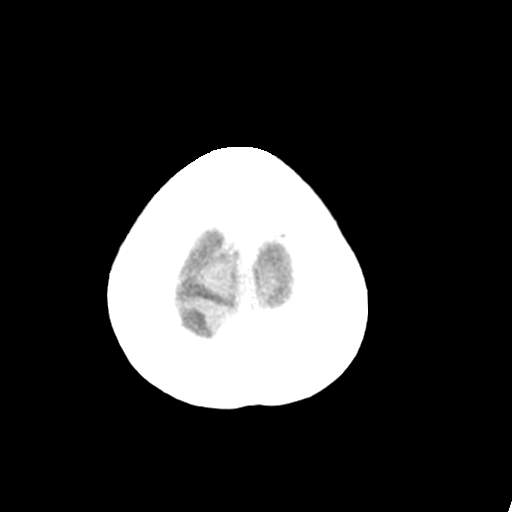

[Series 4: head bone · axial · 0.39mm/px · z∈[-42,-28]mm · 2 of 75 slices shown]
[im 8/75  bone]
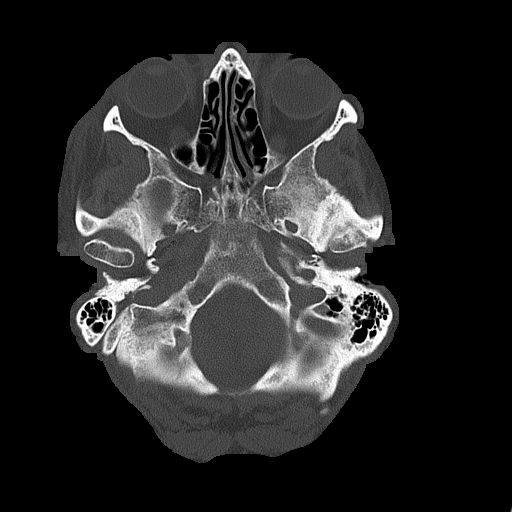
[im 15/75  bone]
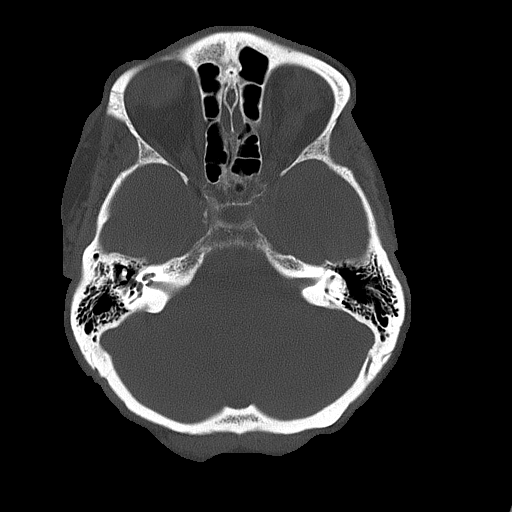

[Series 5: head without cor · coronal · non-contrast · 0.29mm/px · 3 of 69 slices shown]
[im 23/69  brain]
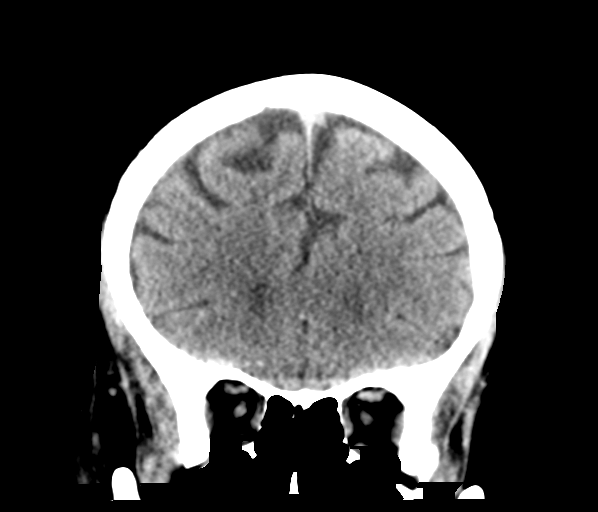
[im 31/69  brain]
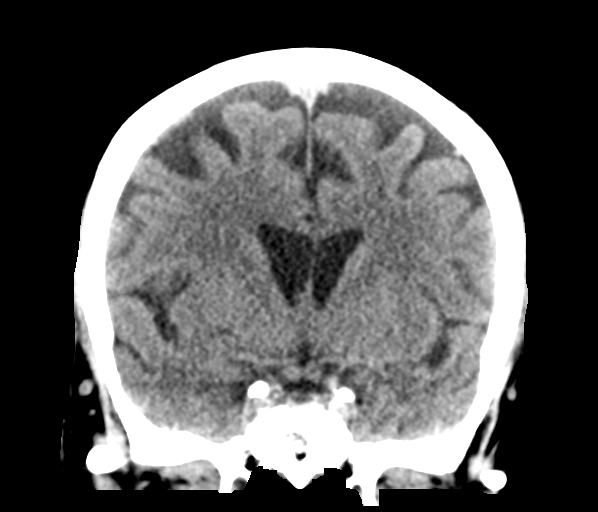
[im 38/69  brain]
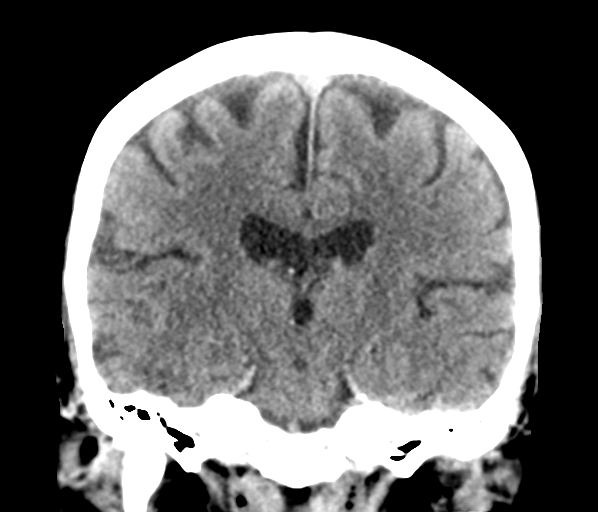

[Series 6: head without sag · sagittal · non-contrast · 0.29mm/px · 3 of 65 slices shown]
[im 22/65  brain]
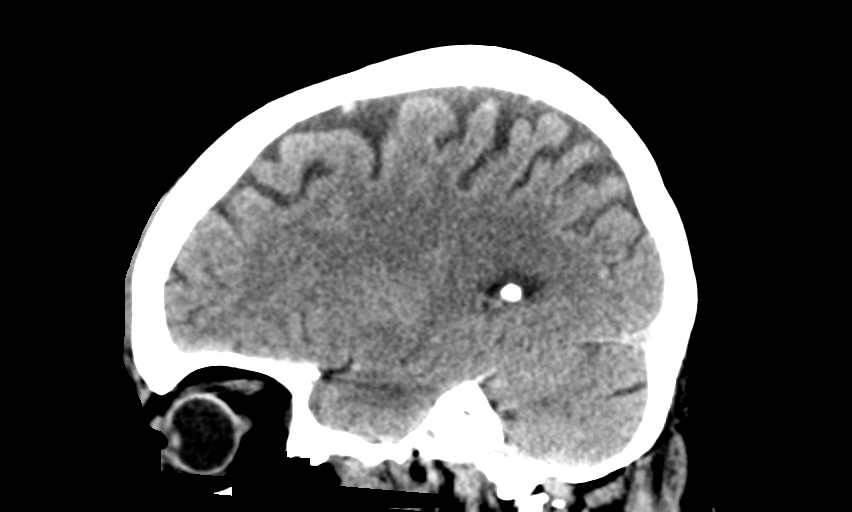
[im 33/65  brain]
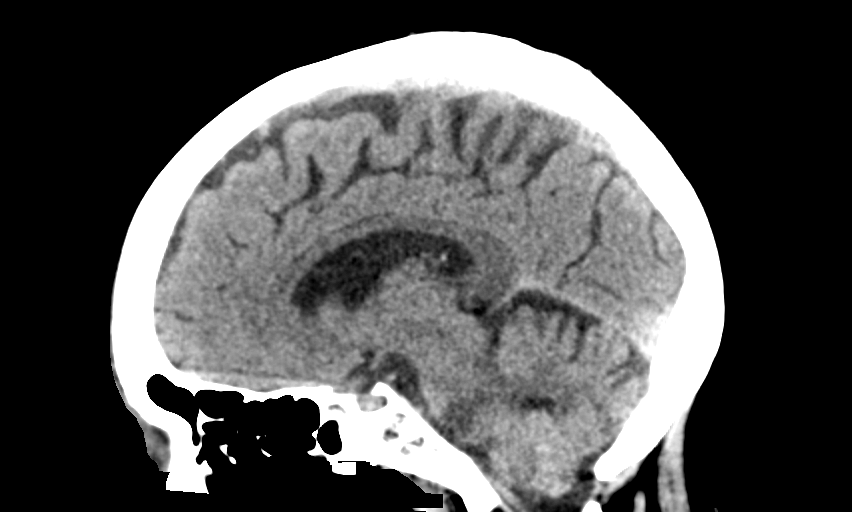
[im 43/65  brain]
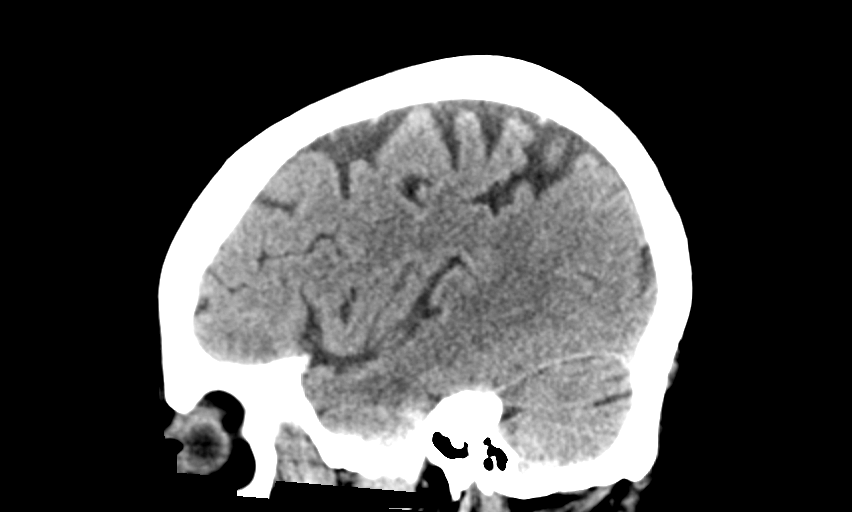

[15 of 47 positions shown; findings below may reference images not displayed]

FINDINGS: Brain: The brain itself has a normal appearance without evidence of
accelerated atrophy, old or acute infarction, mass lesion,
hemorrhage, hydrocephalus or extra-axial collection.

Vascular: There is atherosclerotic calcification of the major
vessels at the base of the brain.

Skull: No skull fracture

Sinuses/Orbits: Sinuses are clear. Orbits are negative. There is
periorbital facial soft tissue swelling right more than left.

Other: Right forehead scalp hematoma measuring up to 3 cm.
IMPRESSION: No intracranial abnormality. No skull fracture. Right forehead scalp
hematoma.

## 2021-12-17 ENCOUNTER — Telehealth: Payer: Self-pay

## 2021-12-17 NOTE — Telephone Encounter (Signed)
NOTES SCANNED TO REFERRAL 

## 2021-12-21 ENCOUNTER — Inpatient Hospital Stay (HOSPITAL_BASED_OUTPATIENT_CLINIC_OR_DEPARTMENT_OTHER)
Admission: EM | Admit: 2021-12-21 | Discharge: 2021-12-28 | DRG: 291 | Disposition: A | Discharge status: Home / Self Care | Payer: PPO | Source: Ambulatory Visit | Attending: Internal Medicine | Admitting: Internal Medicine

## 2021-12-21 ENCOUNTER — Other Ambulatory Visit: Payer: Self-pay

## 2021-12-21 ENCOUNTER — Encounter (HOSPITAL_BASED_OUTPATIENT_CLINIC_OR_DEPARTMENT_OTHER): Payer: Self-pay

## 2021-12-21 ENCOUNTER — Emergency Department (HOSPITAL_BASED_OUTPATIENT_CLINIC_OR_DEPARTMENT_OTHER): Payer: PPO

## 2021-12-21 DIAGNOSIS — Z7984 Long term (current) use of oral hypoglycemic drugs: Secondary | ICD-10-CM

## 2021-12-21 DIAGNOSIS — Z9114 Patient's other noncompliance with medication regimen: Secondary | ICD-10-CM

## 2021-12-21 DIAGNOSIS — D649 Anemia, unspecified: Secondary | ICD-10-CM | POA: Diagnosis present

## 2021-12-21 DIAGNOSIS — R0602 Shortness of breath: Secondary | ICD-10-CM

## 2021-12-21 DIAGNOSIS — I110 Hypertensive heart disease with heart failure: Secondary | ICD-10-CM | POA: Diagnosis not present

## 2021-12-21 DIAGNOSIS — Z20822 Contact with and (suspected) exposure to covid-19: Secondary | ICD-10-CM | POA: Diagnosis present

## 2021-12-21 DIAGNOSIS — I4891 Unspecified atrial fibrillation: Secondary | ICD-10-CM

## 2021-12-21 DIAGNOSIS — Z7982 Long term (current) use of aspirin: Secondary | ICD-10-CM

## 2021-12-21 DIAGNOSIS — Z8249 Family history of ischemic heart disease and other diseases of the circulatory system: Secondary | ICD-10-CM

## 2021-12-21 DIAGNOSIS — R0600 Dyspnea, unspecified: Secondary | ICD-10-CM

## 2021-12-21 DIAGNOSIS — I083 Combined rheumatic disorders of mitral, aortic and tricuspid valves: Secondary | ICD-10-CM | POA: Diagnosis present

## 2021-12-21 DIAGNOSIS — E876 Hypokalemia: Secondary | ICD-10-CM | POA: Diagnosis present

## 2021-12-21 DIAGNOSIS — I5033 Acute on chronic diastolic (congestive) heart failure: Secondary | ICD-10-CM | POA: Diagnosis present

## 2021-12-21 DIAGNOSIS — D259 Leiomyoma of uterus, unspecified: Secondary | ICD-10-CM | POA: Diagnosis present

## 2021-12-21 DIAGNOSIS — J449 Chronic obstructive pulmonary disease, unspecified: Secondary | ICD-10-CM | POA: Diagnosis present

## 2021-12-21 DIAGNOSIS — I480 Paroxysmal atrial fibrillation: Secondary | ICD-10-CM | POA: Diagnosis present

## 2021-12-21 DIAGNOSIS — J9601 Acute respiratory failure with hypoxia: Secondary | ICD-10-CM | POA: Diagnosis present

## 2021-12-21 DIAGNOSIS — I509 Heart failure, unspecified: Secondary | ICD-10-CM

## 2021-12-21 DIAGNOSIS — F17210 Nicotine dependence, cigarettes, uncomplicated: Secondary | ICD-10-CM | POA: Diagnosis present

## 2021-12-21 DIAGNOSIS — Z23 Encounter for immunization: Secondary | ICD-10-CM

## 2021-12-21 DIAGNOSIS — Z720 Tobacco use: Secondary | ICD-10-CM | POA: Diagnosis present

## 2021-12-21 DIAGNOSIS — I4819 Other persistent atrial fibrillation: Secondary | ICD-10-CM | POA: Diagnosis present

## 2021-12-21 DIAGNOSIS — D509 Iron deficiency anemia, unspecified: Secondary | ICD-10-CM | POA: Diagnosis present

## 2021-12-21 DIAGNOSIS — Z7901 Long term (current) use of anticoagulants: Secondary | ICD-10-CM

## 2021-12-21 DIAGNOSIS — I10 Essential (primary) hypertension: Secondary | ICD-10-CM | POA: Diagnosis present

## 2021-12-21 DIAGNOSIS — Z79899 Other long term (current) drug therapy: Secondary | ICD-10-CM

## 2021-12-21 HISTORY — DX: Unspecified atrial fibrillation: I48.91

## 2021-12-21 LAB — CBC WITH DIFFERENTIAL/PLATELET
Abs Immature Granulocytes: 0.02 10*3/uL (ref 0.00–0.07)
Basophils Absolute: 0 10*3/uL (ref 0.0–0.1)
Basophils Relative: 0 %
Eosinophils Absolute: 0 10*3/uL (ref 0.0–0.5)
Eosinophils Relative: 0 %
HCT: 34.4 % — ABNORMAL LOW (ref 36.0–46.0)
Hemoglobin: 10.6 g/dL — ABNORMAL LOW (ref 12.0–15.0)
Immature Granulocytes: 0 %
Lymphocytes Relative: 18 %
Lymphs Abs: 1.1 10*3/uL (ref 0.7–4.0)
MCH: 26.6 pg (ref 26.0–34.0)
MCHC: 30.8 g/dL (ref 30.0–36.0)
MCV: 86.2 fL (ref 80.0–100.0)
Monocytes Absolute: 0.8 10*3/uL (ref 0.1–1.0)
Monocytes Relative: 13 %
Neutro Abs: 4.3 10*3/uL (ref 1.7–7.7)
Neutrophils Relative %: 69 %
Platelets: 305 10*3/uL (ref 150–400)
RBC: 3.99 MIL/uL (ref 3.87–5.11)
RDW: 14.7 % (ref 11.5–15.5)
WBC: 6.4 10*3/uL (ref 4.0–10.5)
nRBC: 0 % (ref 0.0–0.2)

## 2021-12-21 LAB — COMPREHENSIVE METABOLIC PANEL
ALT: 10 U/L (ref 0–44)
AST: 19 U/L (ref 15–41)
Albumin: 3.6 g/dL (ref 3.5–5.0)
Alkaline Phosphatase: 84 U/L (ref 38–126)
Anion gap: 9 (ref 5–15)
BUN: 14 mg/dL (ref 8–23)
CO2: 31 mmol/L (ref 22–32)
Calcium: 8.7 mg/dL — ABNORMAL LOW (ref 8.9–10.3)
Chloride: 96 mmol/L — ABNORMAL LOW (ref 98–111)
Creatinine, Ser: 0.68 mg/dL (ref 0.44–1.00)
GFR, Estimated: >60 mL/min (ref >60)
Glucose, Bld: 141 mg/dL — ABNORMAL HIGH (ref 70–99)
Potassium: 3 mmol/L — ABNORMAL LOW (ref 3.5–5.1)
Sodium: 136 mmol/L (ref 135–145)
Total Bilirubin: 1 mg/dL (ref 0.3–1.2)
Total Protein: 6.8 g/dL (ref 6.5–8.1)

## 2021-12-21 LAB — RESP PANEL BY RT-PCR (FLU A&B, COVID) ARPGX2
Influenza A by PCR: NEGATIVE
Influenza B by PCR: NEGATIVE
SARS Coronavirus 2 by RT PCR: NEGATIVE

## 2021-12-21 LAB — BRAIN NATRIURETIC PEPTIDE: B Natriuretic Peptide: 333.8 pg/mL — ABNORMAL HIGH (ref 0.0–100.0)

## 2021-12-21 LAB — MAGNESIUM: Magnesium: 1.7 mg/dL (ref 1.7–2.4)

## 2021-12-21 MED ORDER — POTASSIUM CHLORIDE CRYS ER 20 MEQ PO TBCR
40.0000 meq | EXTENDED_RELEASE_TABLET | Freq: Once | ORAL | Status: AC
  Administered 2021-12-21: 40 meq via ORAL
  Filled 2021-12-21: qty 2

## 2021-12-21 MED ORDER — FUROSEMIDE 10 MG/ML IJ SOLN
40.0000 mg | Freq: Once | INTRAMUSCULAR | Status: AC
  Administered 2021-12-21: 40 mg via INTRAVENOUS
  Filled 2021-12-21: qty 4

## 2021-12-21 MED ORDER — DILTIAZEM LOAD VIA INFUSION
15.0000 mg | Freq: Once | INTRAVENOUS | Status: AC
  Administered 2021-12-21: 15 mg via INTRAVENOUS
  Filled 2021-12-21: qty 15

## 2021-12-21 MED ORDER — POTASSIUM CHLORIDE CRYS ER 20 MEQ PO TBCR
40.0000 meq | EXTENDED_RELEASE_TABLET | Freq: Every day | ORAL | Status: DC
  Administered 2021-12-21 – 2021-12-22 (×2): 40 meq via ORAL
  Filled 2021-12-21 (×2): qty 2

## 2021-12-21 MED ORDER — ACETAMINOPHEN 325 MG PO TABS
650.0000 mg | ORAL_TABLET | Freq: Four times a day (QID) | ORAL | Status: DC | PRN
  Administered 2021-12-21 – 2021-12-28 (×13): 650 mg via ORAL
  Filled 2021-12-21 (×13): qty 2

## 2021-12-21 MED ORDER — DILTIAZEM HCL-DEXTROSE 125-5 MG/125ML-% IV SOLN (PREMIX)
5.0000 mg/h | INTRAVENOUS | Status: DC
  Administered 2021-12-21: 5 mg/h via INTRAVENOUS
  Administered 2021-12-22: 10 mg/h via INTRAVENOUS
  Filled 2021-12-21 (×2): qty 125

## 2021-12-21 MED ORDER — APIXABAN 5 MG PO TABS
5.0000 mg | ORAL_TABLET | Freq: Two times a day (BID) | ORAL | Status: DC
  Administered 2021-12-21 – 2021-12-28 (×14): 5 mg via ORAL
  Filled 2021-12-21: qty 1
  Filled 2021-12-21: qty 2
  Filled 2021-12-21 (×10): qty 1
  Filled 2021-12-21: qty 2
  Filled 2021-12-21: qty 1

## 2021-12-21 NOTE — ED Triage Notes (Addendum)
Pt c/o SOB started 1/27-states she was seen by PCP 1/27 dx with Afib--states she does not have routine health care-c/o dry cough x 2 days-to triage in w/c-also c/o bilat LE swelling x 1 week-O2 sats high 80s-RT in for assessment-placed on O2 3LNC

## 2021-12-21 NOTE — Progress Notes (Signed)
RT assessed patient in triage. SAT 87% on RA. Placed on 3 LNC, SAT 95%. BBS slightly diminshed. RT to monitor

## 2021-12-21 NOTE — ED Notes (Signed)
Pt c/o headache, 6/10.  EDP [place] made aware.

## 2021-12-21 NOTE — ED Notes (Signed)
Pt repositioned, purewick verified to be working and not leaking, pt provided with denture cup per her request. Call bell within reach, no further needs expressed, will continue to monitor.

## 2021-12-21 NOTE — ED Notes (Signed)
Pt noted to have a 12 beat run of V-Tach, upon entering room pt denies having any symptoms of chest pain during episode. Dr. Regenia Skeeter notified, rhythm strip printed

## 2021-12-21 NOTE — ED Notes (Signed)
Lab notified to add-on BNP and magnesium to previously collected blood work.

## 2021-12-21 NOTE — ED Provider Notes (Signed)
[facility] EMERGENCY DEPARTMENT Provider Note   CSN: [id number] Arrival date & time: 12/21/21  1132     History  Chief Complaint  Patient presents with   Shortness of Breath    Kristina Martin is a 76 y.o. female presenting to the ED with shortness of breath.  The patient reports that she has a history of high blood pressure, does not typically go to the doctors, she does also have a history of smoking and smokes 2 to 3 cigarettes/day (coming to the ED with shortness of breath.  She was seen by her PCP 3 to 4 days ago in the outpatient office, where she was diagnosed with new onset atrial fibrillation.  She reports that she had 2 weeks of worsening dyspnea on exertion or leg swelling preceding that.  She denied any palpitations or chest pressure.  Her PCP started her on Eliquis and she reports a blood pressure medicine which I have not started yet".  She has been taking the Eliquis regularly, including this morning.  She does feel short of breath at rest.  She does not wear oxygen at home.  She is here with her husband at bedside.  She denies any known cardiac history  HPI     Home Medications Prior to Admission medications   Medication Sig Start Date End Date Taking? Authorizing Provider  acetaminophen (TYLENOL) 325 MG tablet Take 650-975 mg by mouth every 4 (four) hours as needed (pain).     [provider]  aspirin 81 MG tablet Take 81 mg by mouth daily.    [provider]  atorvastatin (LIPITOR) 10 MG tablet Take 10 mg by mouth daily.    [provider]  brimonidine (ALPHAGAN) 0.2 % ophthalmic solution Place 1 drop into the right eye 2 (two) times daily. 09/26/19   [provider]  Calcium Carbonate-Vitamin D (CALCIUM + D PO) Take 1 tablet by mouth daily.     [provider]  Cholecalciferol (VITAMIN D PO) Take 1 tablet by mouth daily.     [provider]  lisinopril-hydrochlorothiazide (PRINZIDE,ZESTORETIC) 20-25 MG  tablet Take 0.5 tablets by mouth daily.    [provider]      Allergies    Patient has no known allergies.    Review of Systems   Review of Systems  Physical Exam Updated Vital Signs BP 136/72    Pulse 83    Temp 98 F (36.7 C) (Oral)    Resp (!) 22    SpO2 96%  Physical Exam Constitutional:      General: She is not in acute distress. HENT:     Head: Normocephalic and atraumatic.  Eyes:     Conjunctiva/sclera: Conjunctivae normal.     Pupils: Pupils are equal, round, and reactive to light.  Cardiovascular:     Rate and Rhythm: Tachycardia present. Rhythm irregular.  Pulmonary:     Effort: Pulmonary effort is normal. No respiratory distress.     Breath sounds: Rhonchi present.  Abdominal:     General: There is no distension.     Tenderness: There is no abdominal tenderness.  Musculoskeletal:     Right lower leg: Edema present.     Left lower leg: Edema present.  Skin:    General: Skin is warm and dry.  Neurological:     General: No focal deficit present.     Mental Status: She is alert. Mental status is at baseline.  Psychiatric:  Mood and Affect: Mood normal.        Behavior: Behavior normal.    ED Results / Procedures / Treatments   Labs (all labs ordered are listed, but only abnormal results are displayed) Labs Reviewed  CBC WITH DIFFERENTIAL/PLATELET - Abnormal; Notable for the following components:      Result Value   Hemoglobin 10.6 (*)    HCT 34.4 (*)    All other components within normal limits  COMPREHENSIVE METABOLIC PANEL - Abnormal; Notable for the following components:   Potassium 3.0 (*)    Chloride 96 (*)    Glucose, Bld 141 (*)    Calcium 8.7 (*)    All other components within normal limits  BRAIN NATRIURETIC PEPTIDE - Abnormal; Notable for the following components:   B Natriuretic Peptide 333.8 (*)    All other components within normal limits  RESP PANEL BY RT-PCR (FLU A&B, COVID) ARPGX2  MAGNESIUM    EKG EKG  Interpretation  Date/Time:  Tuesday December 21 2021 11:46:35 EST Ventricular Rate:  144 PR Interval:    QRS Duration: 64 QT Interval:  316 QTC Calculation: 489 R Axis:   98 Text Interpretation: Atrial fibrillation with rapid ventricular response Rightward axis Anteroseptal infarct , age undetermined Abnormal ECG No previous ECGs available Confirmed by Octaviano Glow [phone]) on 12/21/2021 12:23:29 PM  Radiology DG Chest Portable 1 View  Result Date: 12/21/2021 CLINICAL DATA:  Dry cough and shortness of breath. EXAM: PORTABLE CHEST 1 VIEW COMPARISON:  None. FINDINGS: Mild cardiomegaly. Mild interstitial thickening at the right greater than left lung bases. No consolidation, pneumothorax, or large pleural effusion. No acute osseous abnormality. IMPRESSION: 1. Mild cardiomegaly with mild interstitial edema. Electronically Signed   By: Titus Dubin M.D.   On: 12/21/2021 13:05    Procedures .Critical Care Performed by: Wyvonnia Dusky, MD Authorized by: Wyvonnia Dusky, MD   Critical care provider statement:    Critical care time (minutes):  45   Critical care time was exclusive of:  Separately billable procedures and treating other patients   Critical care was necessary to treat or prevent imminent or life-threatening deterioration of the following conditions:  Circulatory failure and respiratory failure   Critical care was time spent personally by me on the following activities:  Ordering and performing treatments and interventions, ordering and review of laboratory studies, ordering and review of radiographic studies, pulse oximetry, review of old charts, examination of patient and evaluation of patient's response to treatment   Care discussed with: admitting provider   Comments:     A. fib with RVR requiring IV rate control medications, new onset congestive heart failure requiring IV diuresis, with oxygen requirement    Medications Ordered in ED Medications  diltiazem (CARDIZEM)  1 mg/mL load via infusion 15 mg (15 mg Intravenous Bolus from Bag 12/21/21 1313)    And  diltiazem (CARDIZEM) 125 mg in dextrose 5% 125 mL (1 mg/mL) infusion (10 mg/hr Intravenous Rate/Dose Change 12/21/21 1338)  apixaban (ELIQUIS) tablet 5 mg (has no administration in time range)  potassium chloride SA (KLOR-CON M) CR tablet 40 mEq (has no administration in time range)  furosemide (LASIX) injection 40 mg (40 mg Intravenous Given 12/21/21 1255)  potassium chloride SA (KLOR-CON M) CR tablet 40 mEq (40 mEq Oral Given 12/21/21 1255)    ED Course/ Medical Decision Making/ A&P Clinical Course as of 12/21/21 1644  Tue Dec 21, 2021  1345 B Natriuretic Peptide(!): 333.8 [MT]  1409 Admitted to hospitalist  Dr Tamala Julian - will order K for evening; hospitalist okay with continuing oral eliquis for A/C at this time.  Pt reassessed and stable, urinating after diuresis [MT]    Clinical Course User Index [MT]  Antolin, Carola Rhine, MD                           Medical Decision Making Amount and/or Complexity of Data Reviewed Labs: ordered. Decision-making details documented in ED Course. Radiology: ordered.  Risk Prescription drug management. Decision regarding hospitalization.   Patient is here with suspected new onset atrial fibrillation, diagnosed 4 days ago for likely with symptoms ongoing for 2 to 3 weeks at this point.  She is tachycardic with a heart rate that is irregular in the 140s.  Her blood pressure is somewhat hypertensive.  I have ordered IV diltiazem bolus and infusion for rate control.  Clinically she does present with signs of pulmonary edema and peripheral edema consistent with congestive heart failure, likely triggered by her atrial fibrillation.  I have ordered 40 mg of IV Lasix for diuresis.  She is on 2 L nasal cannula here, which is a new oxygen requirement, although she is also a lifelong smoker and likely has some component of emphysema.  She has some mild tachypnea here.  I do not see  an indication for BiPAP at this time.  I personally reviewed and interpreted the patient's EKG and labs.  EKG shows A. fib with a heart rate of about 140 bpm.  No acute ST elevations, no chest pressure on exam to suggest ACS.  Her hemoglobin is within normal limits.  Doubt acute anemia.  K is mildly low at 3.0.  Oral K ordered 40 mg now and tonight.  Transition of care discussed with hospitalist as noted above.  Patient would benefit from hospitalization at this time, likely echocardrogram, and HR control and diuresis.        Final Clinical Impression(s) / ED Diagnoses Final diagnoses:  New onset atrial fibrillation ([place])  Atrial fibrillation with RVR ([facility])  Hypokalemia  New onset of congestive heart failure ([place])  Shortness of breath    Rx / DC Orders ED Discharge Orders     None         Wyvonnia Dusky, MD 12/21/21 1644

## 2021-12-22 ENCOUNTER — Inpatient Hospital Stay (HOSPITAL_COMMUNITY): Payer: PPO

## 2021-12-22 DIAGNOSIS — I509 Heart failure, unspecified: Secondary | ICD-10-CM

## 2021-12-22 DIAGNOSIS — Z23 Encounter for immunization: Secondary | ICD-10-CM | POA: Diagnosis present

## 2021-12-22 DIAGNOSIS — Z7984 Long term (current) use of oral hypoglycemic drugs: Secondary | ICD-10-CM | POA: Diagnosis not present

## 2021-12-22 DIAGNOSIS — J9601 Acute respiratory failure with hypoxia: Secondary | ICD-10-CM | POA: Diagnosis present

## 2021-12-22 DIAGNOSIS — I4819 Other persistent atrial fibrillation: Secondary | ICD-10-CM | POA: Diagnosis present

## 2021-12-22 DIAGNOSIS — D509 Iron deficiency anemia, unspecified: Secondary | ICD-10-CM | POA: Diagnosis present

## 2021-12-22 DIAGNOSIS — I5043 Acute on chronic combined systolic (congestive) and diastolic (congestive) heart failure: Secondary | ICD-10-CM | POA: Diagnosis not present

## 2021-12-22 DIAGNOSIS — I083 Combined rheumatic disorders of mitral, aortic and tricuspid valves: Secondary | ICD-10-CM | POA: Diagnosis present

## 2021-12-22 DIAGNOSIS — I5033 Acute on chronic diastolic (congestive) heart failure: Secondary | ICD-10-CM | POA: Diagnosis present

## 2021-12-22 DIAGNOSIS — I5021 Acute systolic (congestive) heart failure: Secondary | ICD-10-CM | POA: Diagnosis not present

## 2021-12-22 DIAGNOSIS — E876 Hypokalemia: Secondary | ICD-10-CM | POA: Diagnosis present

## 2021-12-22 DIAGNOSIS — Z20822 Contact with and (suspected) exposure to covid-19: Secondary | ICD-10-CM | POA: Diagnosis present

## 2021-12-22 DIAGNOSIS — Z720 Tobacco use: Secondary | ICD-10-CM | POA: Diagnosis present

## 2021-12-22 DIAGNOSIS — I480 Paroxysmal atrial fibrillation: Secondary | ICD-10-CM | POA: Diagnosis present

## 2021-12-22 DIAGNOSIS — J449 Chronic obstructive pulmonary disease, unspecified: Secondary | ICD-10-CM | POA: Diagnosis present

## 2021-12-22 DIAGNOSIS — Z7901 Long term (current) use of anticoagulants: Secondary | ICD-10-CM | POA: Diagnosis not present

## 2021-12-22 DIAGNOSIS — F17210 Nicotine dependence, cigarettes, uncomplicated: Secondary | ICD-10-CM | POA: Diagnosis present

## 2021-12-22 DIAGNOSIS — I110 Hypertensive heart disease with heart failure: Secondary | ICD-10-CM | POA: Diagnosis present

## 2021-12-22 DIAGNOSIS — Z9114 Patient's other noncompliance with medication regimen: Secondary | ICD-10-CM | POA: Diagnosis not present

## 2021-12-22 DIAGNOSIS — R0602 Shortness of breath: Secondary | ICD-10-CM | POA: Diagnosis present

## 2021-12-22 DIAGNOSIS — Z7982 Long term (current) use of aspirin: Secondary | ICD-10-CM | POA: Diagnosis not present

## 2021-12-22 DIAGNOSIS — Z8249 Family history of ischemic heart disease and other diseases of the circulatory system: Secondary | ICD-10-CM | POA: Diagnosis not present

## 2021-12-22 DIAGNOSIS — Z79899 Other long term (current) drug therapy: Secondary | ICD-10-CM | POA: Diagnosis not present

## 2021-12-22 DIAGNOSIS — I4891 Unspecified atrial fibrillation: Secondary | ICD-10-CM | POA: Diagnosis not present

## 2021-12-22 DIAGNOSIS — D259 Leiomyoma of uterus, unspecified: Secondary | ICD-10-CM | POA: Diagnosis present

## 2021-12-22 DIAGNOSIS — D649 Anemia, unspecified: Secondary | ICD-10-CM | POA: Diagnosis present

## 2021-12-22 LAB — FERRITIN: Ferritin: 18 ng/mL (ref 11–307)

## 2021-12-22 LAB — BASIC METABOLIC PANEL
Anion gap: 8 (ref 5–15)
BUN: 13 mg/dL (ref 8–23)
CO2: 33 mmol/L — ABNORMAL HIGH (ref 22–32)
Calcium: 8.4 mg/dL — ABNORMAL LOW (ref 8.9–10.3)
Chloride: 95 mmol/L — ABNORMAL LOW (ref 98–111)
Creatinine, Ser: 0.64 mg/dL (ref 0.44–1.00)
GFR, Estimated: >60 mL/min (ref >60)
Glucose, Bld: 110 mg/dL — ABNORMAL HIGH (ref 70–99)
Potassium: 3.7 mmol/L (ref 3.5–5.1)
Sodium: 136 mmol/L (ref 135–145)

## 2021-12-22 LAB — CBC
HCT: 34.7 % — ABNORMAL LOW (ref 36.0–46.0)
Hemoglobin: 10.4 g/dL — ABNORMAL LOW (ref 12.0–15.0)
MCH: 26.3 pg (ref 26.0–34.0)
MCHC: 30 g/dL (ref 30.0–36.0)
MCV: 87.6 fL (ref 80.0–100.0)
Platelets: 286 10*3/uL (ref 150–400)
RBC: 3.96 MIL/uL (ref 3.87–5.11)
RDW: 14.5 % (ref 11.5–15.5)
WBC: 6 10*3/uL (ref 4.0–10.5)
nRBC: 0 % (ref 0.0–0.2)

## 2021-12-22 LAB — HEPATIC FUNCTION PANEL
ALT: 10 U/L (ref 0–44)
AST: 17 U/L (ref 15–41)
Albumin: 3.4 g/dL — ABNORMAL LOW (ref 3.5–5.0)
Alkaline Phosphatase: 81 U/L (ref 38–126)
Bilirubin, Direct: 0.4 mg/dL — ABNORMAL HIGH (ref 0.0–0.2)
Indirect Bilirubin: 0.6 mg/dL (ref 0.3–0.9)
Total Bilirubin: 1 mg/dL (ref 0.3–1.2)
Total Protein: 6.8 g/dL (ref 6.5–8.1)

## 2021-12-22 LAB — TSH: TSH: 0.449 u[IU]/mL (ref 0.350–4.500)

## 2021-12-22 LAB — IRON AND TIBC
Iron: 24 ug/dL — ABNORMAL LOW (ref 28–170)
Saturation Ratios: 5 % — ABNORMAL LOW (ref 10.4–31.8)
TIBC: 508 ug/dL — ABNORMAL HIGH (ref 250–450)
UIBC: 484 ug/dL

## 2021-12-22 LAB — PROTIME-INR
INR: 1.4 — ABNORMAL HIGH (ref 0.8–1.2)
Prothrombin Time: 17.4 seconds — ABNORMAL HIGH (ref 11.4–15.2)

## 2021-12-22 MED ORDER — SODIUM CHLORIDE 0.9% FLUSH
3.0000 mL | Freq: Two times a day (BID) | INTRAVENOUS | Status: DC
  Administered 2021-12-22 – 2021-12-28 (×10): 3 mL via INTRAVENOUS

## 2021-12-22 MED ORDER — ALBUTEROL SULFATE (2.5 MG/3ML) 0.083% IN NEBU: 2.5000 mg | INHALATION_SOLUTION | RESPIRATORY_TRACT | Status: DC | PRN

## 2021-12-22 MED ORDER — SODIUM CHLORIDE 0.9% FLUSH: 3.0000 mL | INTRAVENOUS | Status: DC | PRN

## 2021-12-22 MED ORDER — FUROSEMIDE 10 MG/ML IJ SOLN: 40.0000 mg | Freq: Every day | INTRAMUSCULAR | Status: DC

## 2021-12-22 MED ORDER — SODIUM CHLORIDE 0.9 % IV SOLN: 250.0000 mL | INTRAVENOUS | Status: DC | PRN

## 2021-12-22 MED ORDER — POTASSIUM CHLORIDE CRYS ER 20 MEQ PO TBCR: 20.0000 meq | EXTENDED_RELEASE_TABLET | Freq: Every day | ORAL | Status: DC

## 2021-12-22 MED ORDER — METOPROLOL TARTRATE 12.5 MG HALF TABLET
12.5000 mg | ORAL_TABLET | Freq: Two times a day (BID) | ORAL | Status: DC
  Administered 2021-12-22 – 2021-12-23 (×3): 12.5 mg via ORAL
  Filled 2021-12-22 (×3): qty 1

## 2021-12-22 MED ORDER — NICOTINE 7 MG/24HR TD PT24
7.0000 mg | MEDICATED_PATCH | Freq: Every day | TRANSDERMAL | Status: DC
  Administered 2021-12-22 – 2021-12-28 (×7): 7 mg via TRANSDERMAL
  Filled 2021-12-22 (×8): qty 1

## 2021-12-22 MED ORDER — FUROSEMIDE 10 MG/ML IJ SOLN
40.0000 mg | Freq: Once | INTRAMUSCULAR | Status: AC
  Administered 2021-12-22: 40 mg via INTRAVENOUS
  Filled 2021-12-22: qty 4

## 2021-12-22 NOTE — ED Notes (Signed)
Patient c/o back pain.  Medicated per PRN order

## 2021-12-22 NOTE — ED Notes (Signed)
Report given to [facility]. 

## 2021-12-22 NOTE — Assessment & Plan Note (Addendum)
Stable, continue diuretics, metoprolol as noted above

## 2021-12-22 NOTE — Assessment & Plan Note (Deleted)
Patient with elevated BNP, CXR with interstitial edema, LE edema, orthopnea and respiratory failure with hypoxia consistent with CHF exacerbation in setting of afib with RVR Continue lasix 40mg  IV daily, monitor output  Echo ordered Strict I/O and daily weights Stopped cardizem, starting metoprolol BID  ? If uncontrolled HTN, age, afib, tobacco abuse all contributing factors.

## 2021-12-22 NOTE — ED Notes (Signed)
Verbal order received from Dr. Karle Starch to decrease diltiazem drip to 5 mL/hr due to patient maintaining a HR in the 70's since last titration

## 2021-12-22 NOTE — Assessment & Plan Note (Addendum)
Suspected COPD  -nicotine patch and encouraged cessation.  -recommend outpatient PFTs -xoponex prn

## 2021-12-22 NOTE — Assessment & Plan Note (Addendum)
-  A. fib recently diagnosed at PCP office on Friday 12/17/21, started on Eliquis then  -Started on Cardizem drip in the ED, then transitioned to metoprolol  -Cardiology following, echo with preserved EF -Metoprolol dose increased, heart rate controlled -Plan for outpatient DC cardioversion after 3 weeks of anticoagulation

## 2021-12-22 NOTE — Assessment & Plan Note (Addendum)
Repleted, monitor

## 2021-12-22 NOTE — ED Notes (Signed)
Patient reports discomfort from sitting on stretcher all day.  Patient provided with recliner chair and assisted to move to recliner.  New external catheter placed.   Patient reports improvement after being moved

## 2021-12-22 NOTE — Consult Note (Addendum)
Cardiology Consultation:   Patient ID: Kristina Martin MRN: [id number]; DOB: 1946/05/04  Admit date: 12/21/2021 Date of Consult: 12/22/2021  PCP:  Leonard Downing, MD   [facility] Providers Cardiologist:  Mertie Moores, MD   (new)   Patient Profile:   Kristina Martin is a 76 y.o. female with a hx of HTN, tobacco abuse, recently diagnosed afib on eliquis (diagnosed by PCP on 1/27) who is being seen 12/22/2021 for the evaluation of CHF, afib at the request of Dr. Rogers Blocker.  History of Present Illness:   Kristina Martin does not have any cardiac history and has never been seen by a cardiologist. Patient was diagnosed with afib by PCP on 1/27 and started on eliquis.   Patient presented to the ED at [facility] on 1/30 complaining of dry cough for 2 days, bilateral leg swelling for the past week. Patient was hypoxic with O2 sats in the 80s on presentation, improved with supplemental oxygen via nasal cannula. Labs in the ED showed Na 136, K 3.0, creatinine 0.68, mag 1.7, hemoglobin 10.6, WBC 6.4. COVIC/flu negative. BNP elevated to 333.8. CXR showed mild cardiomegaly with mild interstitial edema. EKG showed afib with RVR (rate 144), rightward axis, Q waves in V1-V4. Patient was admitted and cardiology was asked to consult for CHF, afib.   On interview, patient denies any chest pain. SOB and leg welling have improved since coming to the hospital. Denies any palpitations, dizziness. Has a long smoking history, continues to smoke cigarettes. Has not been eating as much as usual lately because she often has to go to the bathroom right away after eating.   Past Medical History:  Diagnosis Date   A-fib ([place])    per pt   Fibroid    Hypertension     Past Surgical History:  Procedure Laterality Date   CESAREAN SECTION     CHOLECYSTECTOMY     DILATION AND CURETTAGE OF UTERUS     HYSTEROSCOPY       Home Medications:  Prior to Admission medications   Medication Sig Start Date End Date Taking?  Authorizing Provider  acetaminophen (TYLENOL) 325 MG tablet Take 650-975 mg by mouth every 4 (four) hours as needed (pain).    Yes [provider]  albuterol (VENTOLIN HFA) 108 (90 Base) MCG/ACT inhaler Inhale 2 puffs into the lungs every 6 (six) hours as needed for shortness of breath. 08/07/21  Yes [provider]  Calcium Carbonate-Vitamin D (CALCIUM + D PO) Take 1 tablet by mouth daily.    Yes [provider]  Cholecalciferol (VITAMIN D PO) Take 1 tablet by mouth daily.    Yes [provider]  ELIQUIS 5 MG TABS tablet Take 5 mg by mouth in the morning and at bedtime. 12/17/21  Yes [provider]  lisinopril-hydrochlorothiazide (PRINZIDE,ZESTORETIC) 20-25 MG tablet Take 0.5 tablets by mouth daily. Patient not taking: Reported on 12/22/2021    [provider]    Inpatient Medications: Scheduled Meds:  apixaban  5 mg Oral BID   [START ON 12/23/2021] furosemide  40 mg Intravenous Daily   metoprolol tartrate  12.5 mg Oral BID   nicotine  7 mg Transdermal Daily   [START ON 12/23/2021] potassium chloride  20 mEq Oral Daily   sodium chloride flush  3 mL Intravenous Q12H   Continuous Infusions:  sodium chloride     PRN Meds: sodium chloride, acetaminophen, albuterol, sodium chloride flush  Allergies:   No Known Allergies  Social History:  Social History   Socioeconomic History   Marital status: Married    Spouse name: Not on file   Number of children: Not on file   Years of education: Not on file   Highest education level: Not on file  Occupational History   Not on file  Tobacco Use   Smoking status: Every Day    Types: Cigarettes   Smokeless tobacco: Never  Vaping Use   Vaping Use: Never used  Substance and Sexual Activity   Alcohol use: No   Drug use: No   Sexual activity: Yes    Birth control/protection: Post-menopausal  Other Topics Concern   Not on file  Social History Narrative   Not on file   Social Determinants  of Health   Financial Resource Strain: Not on file  Food Insecurity: Not on file  Transportation Needs: Not on file  Physical Activity: Not on file  Stress: Not on file  Social Connections: Not on file  Intimate Partner Violence: Not on file    Family History:    Family History  Problem Relation Age of Onset   Osteoporosis Mother    Hypertension Father    Diabetes Brother    Hypertension Brother    Hypertension Brother    Heart disease Brother      ROS:  Please see the history of present illness.  All other ROS reviewed and negative.     Physical Exam/Data:   Vitals:   12/22/21 1030 12/22/21 1100 12/22/21 1134 12/22/21 1300  BP: 128/73 133/86 125/67 136/85  Pulse: 81 87 99   Resp: (!) 22 19 (!) 23 (!) 24  Temp:   98.4 F (36.9 C) 97.7 F (36.5 C)  TempSrc:   Oral Oral  SpO2: 95% 97% 99% 100%  Weight:    61.2 kg  Height:    5\' 4"  (1.626 m)    Intake/Output Summary (Last 24 hours) at 12/22/2021 1459 Last data filed at 12/22/2021 1143 Gross per 24 hour  Intake 109.67 ml  Output 2100 ml  Net -1990.33 ml   Last 3 Weights 12/22/2021 05/15/2013 04/11/2012  Weight (lbs) 134 lb 14.7 oz 132 lb 137 lb  Weight (kg) 61.2 kg 59.875 kg 62.143 kg     Body mass index is 23.16 kg/m.  General:  in no acute distress resting comfortably in the bed, chronically ill appearing  HEENT: normal Neck: no JVD Vascular: Radial pulses 2+ bilaterally Cardiac:  normal S1, S2; RRR; no murmur  Lungs:  Wheezing heard throughout, becomes mildly dyspneic with talking, decreased breath sounds in bilateral lung bases  Abd: soft, nontender Ext: Bilateral 2+ pitting edema to just below the knees  Musculoskeletal:  No deformities Skin: warm and dry  Neuro:  CNs 2-12 intact, no focal abnormalities noted Psych:  Normal affect   EKG:  The EKG was personally reviewed and demonstrates:  EKG showed afib with RVR (rate 144), rightward axis, Q waves in V1-V4.  Telemetry:  Telemetry was personally reviewed  and demonstrates:  Afib, rates in the 110s-120s   Relevant CV Studies: Echo pending  Laboratory Data:  High Sensitivity Troponin:  No results for input(s): TROPONINIHS in the last 720 hours.   Chemistry Recent Labs  Lab 12/21/21 1200 12/22/21 0841  NA 136 136  K 3.0* 3.7  CL 96* 95*  CO2 31 33*  GLUCOSE 141* 110*  BUN 14 13  CREATININE 0.68 0.64  CALCIUM 8.7* 8.4*  MG 1.7  --   GFRNONAA >60 >60  ANIONGAP 9 8    Recent Labs  Lab 12/21/21 1200  PROT 6.8  ALBUMIN 3.6  AST 19  ALT 10  ALKPHOS 84  BILITOT 1.0   Lipids No results for input(s): CHOL, TRIG, HDL, LABVLDL, LDLCALC, CHOLHDL in the last 168 hours.  Hematology Recent Labs  Lab 12/21/21 1200  WBC 6.4  RBC 3.99  HGB 10.6*  HCT 34.4*  MCV 86.2  MCH 26.6  MCHC 30.8  RDW 14.7  PLT 305   Thyroid No results for input(s): TSH, FREET4 in the last 168 hours.  BNP Recent Labs  Lab 12/21/21 1200  BNP 333.8*    DDimer No results for input(s): DDIMER in the last 168 hours.   Radiology/Studies:  DG Chest Portable 1 View  Result Date: 12/21/2021 CLINICAL DATA:  Dry cough and shortness of breath. EXAM: PORTABLE CHEST 1 VIEW COMPARISON:  None. FINDINGS: Mild cardiomegaly. Mild interstitial thickening at the right greater than left lung bases. No consolidation, pneumothorax, or large pleural effusion. No acute osseous abnormality. IMPRESSION: 1. Mild cardiomegaly with mild interstitial edema. Electronically Signed   By: Titus Dubin M.D.   On: 12/21/2021 13:05     Assessment and Plan:   Afib with RVR: CHADS-VASc score 4 (HTN, age x2, gender)  - Diagnosed with afib by PCP on 1/27 - Currently on eliquis 5 mg BID for anticoagulation  - Was started on a diltizem drip yesterday and overnight. Rate improved and BP tolerated.  - Diltiazem drip was stopped this afternoon and patient was started on metoprolol 12.5 mg BID. Per telemetry, rate is improving but continues to have episodes in the 110s-120s. Will increase  metoprolol to 25 mg BID.  - Echo pending , TSH ordered - Echo findings will help assess whether patient would be a good candidate for cardioversion. Patient has been on eliquis since 1/27 and has not missed any doses. However, unsure when patient developed afib as it was diagnosed by PCP.  Acute exacerbation of CHF  - BNP elevated to 333.8 - CXR showed mild cardiomegaly, mild interstitial edema  - Patient was started on IV lasix 40mg  daily. Has received 2 doses so far. Currently net -1.9 L fluids, weight currently 134.92 lbs. Dry weight unknown - Increasing metoprolol to 25 mg BID  - Echo pending  - If echo shows abnormalities suspicious for CAD, will consider ischemic eval (stress test, cath) this admission. Possible that fast afib has been affecting breathing, heart function. Other risk factors include uncontrolled HTN, tobacco abuse   HTN  - Patient has been out of medication for 2 months  - On BB, BP is tolerating well. Will continue to monitor   Hypokalemia  - Managed per primary team     Risk Assessment/Risk Scores:    New York Heart Association (NYHA) Functional Class NYHA Class II  CHA2DS2-VASc Score = 4   The patient's score is based upon: CHF History: 0 HTN History: 1 Diabetes History: 0 Stroke History: 0 Vascular Disease History: 0 Age Score: 2 Gender Score: 1        For questions or updates, please contact [facility] Please consult [email or web address] for contact info under    Signed, Margie Billet, PA-C  12/22/2021 2:59 PM   Attending Note:   The patient was seen and examined.  Agree with assessment and plan as noted above.  Changes made to the above note as needed.  Patient seen and independently examined with Nunzio Cory "KJ" [place], [place] .   We  discussed all aspects of the encounter. I agree with the assessment and plan as stated above.     Rapid atrial fib:   her HR is better on the metoprolol but could likely be a bit better controlled with a  higher dose of metoprolol Cont eliquis for now.   Echo has been ordered,  TSH has been ordered  If she feels better with rate control, we will anticipate doing an OP cardioversion in 3-4 weeks after adequate anticoagulation   2.  Acute CHF :   may be related to her rapid ventricular response.  We will get an echocardiogram.  She has a long history of cigarette smoking and may also have coronary artery disease. She is diuresed a net 1.9 L on Lasix 40 mg a day.  3.   COPD: She has reduced breath sounds bilaterally.  She has  I strongly encouraged her to stop smoking.  She appears to be chronically ill.   Looks like she has not been eating well.   4.  HTN:  will titrate meds as needed.    I have spent a total of 40 minutes with patient reviewing hospital  notes , telemetry, EKGs, labs and examining patient as well as establishing an assessment and plan that was discussed with the patient.  > 50% of time was spent in direct patient care.    Thayer Headings, Brooke Bonito., MD, [facility] 12/22/2021, 3:52 PM [address]  [place] Pager [phone]

## 2021-12-22 NOTE — Assessment & Plan Note (Addendum)
Iron deficiency anemia  -Long history of anemia, history of fibroids, denies any active bleeding  -Never had a colonoscopy  -Discussed with patient and daughter, will refer to gastroenterology at discharge -Completed IV iron

## 2021-12-22 NOTE — Progress Notes (Signed)
Progress Note  Patient Name: Kristina Martin Date of Encounter: 12/23/2021  [facility] Cardiologist: Mertie Moores, MD   Subjective   Feeling overall better this morning. Remains in Afib with HR mainly 100-120s. Has mild, productive cough. Continues to have orthopnea and LE edema. No chest pain. Breathing is stable.   Net negative 1.3L    Inpatient Medications    Scheduled Meds:  apixaban  5 mg Oral BID   furosemide  40 mg Intravenous BID   metoprolol tartrate  12.5 mg Oral BID   nicotine  7 mg Transdermal Daily   potassium chloride  40 mEq Oral Daily   sodium chloride flush  3 mL Intravenous Q12H   Continuous Infusions:  sodium chloride     PRN Meds: sodium chloride, acetaminophen, albuterol, sodium chloride flush   Vital Signs    Vitals:   12/22/21 2000 12/23/21 0000 12/23/21 0258 12/23/21 0727  BP: 133/70 109/78 137/86 118/68  Pulse: 92  99 91  Resp: (!) 24 20 19  (!) 23  Temp: 98.1 F (36.7 C) 97.6 F (36.4 C) 97.9 F (36.6 C) (!) 97.5 F (36.4 C)  TempSrc: Oral Oral  Oral  SpO2: 100% 98% 100% 98%  Weight:   62.6 kg   Height:        Intake/Output Summary (Last 24 hours) at 12/23/2021 0916 Last data filed at 12/23/2021 0023 Gross per 24 hour  Intake 240 ml  Output 1500 ml  Net -1260 ml   Last 3 Weights 12/23/2021 12/22/2021 05/15/2013  Weight (lbs) 138 lb 0.1 oz 134 lb 14.7 oz 132 lb  Weight (kg) 62.6 kg 61.2 kg 59.875 kg      Telemetry    Afib with HR 100-120s - Personally Reviewed  ECG    No new tracing - Personally Reviewed  Physical Exam   GEN: No acute distress.   Neck: No JVD Cardiac: irregular, no murmurs Respiratory: Diminished throughout GI: Soft, nontender, non-distended  MS: 1+ pitting edema to mid-shin. Warm Neuro:  Nonfocal  Psych: Normal affect   Labs    High Sensitivity Troponin:  No results for input(s): TROPONINIHS in the last 720 hours.   Chemistry Recent Labs  Lab 12/21/21 1200 12/22/21 0841 12/22/21 1419  12/23/21 0221  NA 136 136  --  134*  K 3.0* 3.7  --  3.7  CL 96* 95*  --  94*  CO2 31 33*  --  32  GLUCOSE 141* 110*  --  113*  BUN 14 13  --  10  CREATININE 0.68 0.64  --  0.64  CALCIUM 8.7* 8.4*  --  8.7*  MG 1.7  --   --   --   PROT 6.8  --  6.8  --   ALBUMIN 3.6  --  3.4*  --   AST 19  --  17  --   ALT 10  --  10  --   ALKPHOS 84  --  81  --   BILITOT 1.0  --  1.0  --   GFRNONAA >60 >60  --  >60  ANIONGAP 9 8  --  8    Lipids No results for input(s): CHOL, TRIG, HDL, LABVLDL, LDLCALC, CHOLHDL in the last 168 hours.  Hematology Recent Labs  Lab 12/21/21 1200 12/22/21 1419 12/23/21 0221  WBC 6.4 6.0 7.2  RBC 3.99 3.96 3.65*  HGB 10.6* 10.4* 9.5*  HCT 34.4* 34.7* 32.2*  MCV 86.2 87.6 88.2  MCH 26.6 26.3  26.0  MCHC 30.8 30.0 29.5*  RDW 14.7 14.5 14.6  PLT 305 286 266   Thyroid  Recent Labs  Lab 12/22/21 1419  TSH 0.449    BNP Recent Labs  Lab 12/21/21 1200  BNP 333.8*    DDimer No results for input(s): DDIMER in the last 168 hours.   Radiology    DG Chest Portable 1 View  Result Date: 12/21/2021 CLINICAL DATA:  Dry cough and shortness of breath. EXAM: PORTABLE CHEST 1 VIEW COMPARISON:  None. FINDINGS: Mild cardiomegaly. Mild interstitial thickening at the right greater than left lung bases. No consolidation, pneumothorax, or large pleural effusion. No acute osseous abnormality. IMPRESSION: 1. Mild cardiomegaly with mild interstitial edema. Electronically Signed   By: Titus Dubin M.D.   On: 12/21/2021 13:05    Cardiac Studies   TTE pending  Patient Profile     76 y.o. female with a hx of HTN, tobacco abuse, recently diagnosed afib on eliquis (diagnosed by PCP on 1/27) who presented with cough and LE edema found to have acute on chronic HFpEF exacerbation and Afib with RVR for which Cardiology has been consulted.  Assessment & Plan    #Afib with RVR:  CHADS-VASc score 4 (HTN, age x2, gender). Recently diagnosed by PCP on 12/17/21 started on  apixaban. Presented to [facility] with cough and LE edema found to be hypoxic with HR 140s in RVR. Initially placed on dilt gtt with improvement of rates now transitioned to metop PO.  -Continue apixaban 5mg  BID -Increase metop to 25mg  BID -Follow-up TTE -TSH normal -Likely plan for DCCV as out-patient after 3 weeks uninterrupted AC unless TTE with reduced LVEF   #Acute exacerbation of CHF  Patient presented with worsening LE edema and cough found to be in Afib with RVR with evidence of volume overload. BNP elevated to 333.8. CXR showed mild cardiomegaly, mild interstitial edema. TTE pending.  -Continue lasix 40mg  IV daily -Increase metop to 25mg  BID -Will add GDMT as tolerated as guided by EF on TTE -TTE pending -Monitor I/Os and daily weights -Low Na diet  #Acute Respiratory Failure with Hypoxia: Noted on admission to have sats in the 80s in the setting of Afib with RVR, volume overload and likely underlying COPD given significant smoking history. Now on supplemental O2 with improvement. -Manage HF and Afib as above -Encourage smoking cessation -Need Pulm follow-up as out-patient   #HTN  -Increase metop to 25mg  BID  #Tobacco Abuse: -Encourage cessation     For questions or updates, please contact [place] Please consult [email or web address] for contact info under        Signed, Freada Bergeron, MD  12/23/2021, 9:16 AM

## 2021-12-22 NOTE — TOC Progression Note (Signed)
Transition of Care [facility]) - Progression Note    Patient Details  Name: Kristina Martin MRN: [id number] Date of Birth: 13-Feb-1946  Transition of Care [facility]) CM/SW Contact  Zenon Mayo, RN Phone Number: 12/22/2021, 3:13 PM  Clinical Narrative:     Transition of Care [facility]) Screening Note   Patient Details  Name: Kristina Martin Date of Birth: 07/02/1946   Transition of Care [name]) CM/SW Contact:    Zenon Mayo, RN Phone Number: 12/22/2021, 3:13 PM    Transition of Care Department [facility]) has reviewed patient and no TOC needs have been identified at this time. We will continue to monitor patient advancement through interdisciplinary progression rounds. If new patient transition needs arise, please place a TOC consult.          Expected Discharge Plan and Services                                                 Social Determinants of Health (SDOH) Interventions    Readmission Risk Interventions No flowsheet data found.

## 2021-12-22 NOTE — ED Provider Notes (Signed)
Care of the patient assumed at the change of shift pending admission for rapid afib, hypoxia and CHF.  Physical Exam  BP 129/80    Pulse 82    Temp 98 F (36.7 C) (Oral)    Resp 20    SpO2 98%   Physical Exam Sleeping well, arouses to verbal stimuli, sitting in bed, no respiratory distress  Procedures  Procedures  ED Course / MDM   Clinical Course as of 12/22/21 0847  Tue Dec 21, 2021  1345 B Natriuretic Peptide(!): 333.8 [MT]  1409 Admitted to hospitalist Dr Tamala Julian - will order K for evening; hospitalist okay with continuing oral eliquis for A/C at this time.  Pt reassessed and stable, urinating after diuresis [MT]  Wed Dec 22, 2021  0846 [name] HR has been well controlled for several hours on Cardizem drip which has not been weaned off. We also attempted to wean her off oxygen but she drops her SpO2 to 88% at rest on room air. Will give additional dose of lasix, restart O2 and continue with plan for admission. Bed Control is aware.  [CS]    Clinical Course User Index [CS] Truddie Hidden, MD [MT] Langston Masker Carola Rhine, MD   Medical Decision Making Amount and/or Complexity of Data Reviewed Labs: ordered. Decision-making details documented in ED Course. Radiology: ordered.  Risk OTC drugs. Prescription drug management. Decision regarding hospitalization.          Truddie Hidden, MD 12/22/21 1535

## 2021-12-22 NOTE — H&P (Signed)
History and Physical    Patient: Kristina Martin DOB: [birth year] DOA: 12/21/2021 DOS: the patient was seen and examined on 12/22/2021 PCP: Leonard Downing, MD  Patient coming from:  [facility]  - lives with her husband and a grandson.    Chief Complaint: shortness of breath   HPI: Kristina Martin is a 76 y.o. female with medical history significant of HTN, tobacco abuse, newly diagnosed atrial fibrillation who presented to Ed with complaints of shortness of breath. She states she has had shortness of breath x 2 weeks, but last Friday it seemed to get progressively worse to the point that she had to go to the ED yesterday. She has also noticed lower leg swelling over the last 2 weeks. Also has worsening orthopnea. She has both dyspnea on exertion and at rest. Diagnosed with atrial fibrillation at her PCP last Friday (1/27) and started her on eliquis, referred to cardiology (has appointment on Friday). No rate controlling drugs started. She has not missed any of her eliquis doses.   She overall has been doing well. Denies any fever/chills, vision changes/headaches, chest pain or palpitations, she has a chronic cough at baseline, she has abdomina pain relieved after she has a BM and typically has loose stool after eating, no N/V, no dysuria or rashes.   She does smoke one cigarette/day.  No family hx of CHF.   ER Course:   Vitals: afebrile, bp: 182/104, HR: 66, RR: 24, oxygen 89% RA Pertinent labs: hgb: 10.6, potassium: 3.0, glucose: 141, BNP: 333,  CXR: mild cardiomegaly with mild interstitial edema. EKG: rate of 144, atrial fibrillation  In ED: started on cardizem drip, given lasix 40mg  x1 and potassium: 36meq x1. Repeat potassium 3.7.    Review of Systems: As mentioned in the history of present illness. All other systems reviewed and are negative. Past Medical History:  Diagnosis Date   A-fib ([place])    per pt   Fibroid    Hypertension    Past Surgical History:  Procedure  Laterality Date   CESAREAN SECTION     CHOLECYSTECTOMY     DILATION AND CURETTAGE OF UTERUS     HYSTEROSCOPY     Social History:  reports that she has been smoking cigarettes. She has never used smokeless tobacco. She reports that she does not drink alcohol and does not use drugs.  No Known Allergies  Family History  Problem Relation Age of Onset   Osteoporosis Mother    Hypertension Father    Diabetes Brother    Hypertension Brother    Hypertension Brother    Heart disease Brother     Prior to Admission medications   Medication Sig Start Date End Date Taking? Authorizing Provider  acetaminophen (TYLENOL) 325 MG tablet Take 650-975 mg by mouth every 4 (four) hours as needed (pain).     [provider]  albuterol (VENTOLIN HFA) 108 (90 Base) MCG/ACT inhaler Inhale 2 puffs into the lungs every 6 (six) hours as needed for shortness of breath. 08/07/21   [provider]  aspirin 81 MG tablet Take 81 mg by mouth daily.    [provider]  atorvastatin (LIPITOR) 10 MG tablet Take 10 mg by mouth daily.    [provider]  brimonidine (ALPHAGAN) 0.2 % ophthalmic solution Place 1 drop into the right eye 2 (two) times daily. 09/26/19   [provider]  Calcium Carbonate-Vitamin D (CALCIUM + D PO) Take 1 tablet by mouth daily.  [provider]  Cholecalciferol (VITAMIN D PO) Take 1 tablet by mouth daily.     [provider]  ELIQUIS 5 MG TABS tablet Take 5 mg by mouth in the morning and at bedtime. 12/17/21   [provider]  lisinopril-hydrochlorothiazide (PRINZIDE,ZESTORETIC) 20-25 MG tablet Take 0.5 tablets by mouth daily.    [provider]    Physical Exam: Vitals:   12/22/21 1100 12/22/21 1134 12/22/21 1300 12/22/21 1619  BP: 133/86 125/67 136/85 117/70  Pulse: 87 99  94  Resp: 19 (!) 23 (!) 24 19  Temp:  98.4 F (36.9 C) 97.7 F (36.5 C) 97.6 F (36.4 C)  TempSrc:  Oral Oral Oral  SpO2: 97% 99%  100% 100%  Weight:   61.2 kg   Height:   5\' 4"  (1.626 m)    General:  Appears calm and comfortable and is in NAD Eyes:  PERRL, EOMI, normal lids, iris ENT:  grossly normal hearing, lips & tongue, mmm; appropriate dentition Neck:  no LAD, masses or thyromegaly; no carotid bruits Cardiovascular:  irregularly, irregular, no m/r/g. Bilateral LE edema to above knee. 2+ Respiratory:   decreased breath sounds in bilateral bases with poor air movement. Mildly dyspneic with talking.  Normal respiratory effort. Abdomen:  soft, NT, ND, NABS Back:   normal alignment, no CVAT Skin:  no rash or induration seen on limited exam Musculoskeletal:  grossly normal tone BUE/BLE, good ROM, no bony abnormality Lower extremity:  Limited foot exam with no ulcerations.  2+ distal pulses. Psychiatric:  grossly normal mood and affect, speech fluent and appropriate, AOx3 Neurologic:  CN 2-12 grossly intact, moves all extremities in coordinated fashion, sensation intact   Radiological Exams on Admission: Independently reviewed - see discussion in A/P where applicable  DG Chest Portable 1 View  Result Date: 12/21/2021 CLINICAL DATA:  Dry cough and shortness of breath. EXAM: PORTABLE CHEST 1 VIEW COMPARISON:  None. FINDINGS: Mild cardiomegaly. Mild interstitial thickening at the right greater than left lung bases. No consolidation, pneumothorax, or large pleural effusion. No acute osseous abnormality. IMPRESSION: 1. Mild cardiomegaly with mild interstitial edema. Electronically Signed   By: Titus Dubin M.D.   On: 12/21/2021 13:05    EKG: Independently reviewed.  Atrial fibrillation with rate 144; nonspecific ST changes with no evidence of acute ischemia   Labs on Admission: I have personally reviewed the available labs and imaging studies at the time of the admission.  Pertinent labs:   hgb: 10.6,  potassium: 3.0,  glucose: 141,  BNP: 333,   Assessment and Plan: Assessment and Plan: * Acute respiratory  failure with hypoxia ([place])- (present on admission) 76 year old female presenting with 2 week history of worsening dypsnea at rest and exertion found have hypoxia on room air down to 88% as well as dyspnea with talking. Likely secondary to atrial fibrillation with RVR and acute CHF (possible new diagnosis) -admit to progressive -currently requiring 2-3L oxygen, treat above and wean as tolerated -has extensive smoking history which could also be contributing. Start xoponex prn, recommend PFTs outpatient and smoking cessation   Paroxysmal atrial fibrillation with RVR ([name])- (present on admission) Has had 2 week history of dyspnea with exertion and at rest, denies any palpitations Diagnosed at PCP office on Friday 12/17/21, but unsure how long she has been in afib  Started on eliquis, has not missed any doses Rate on arrival to ED was 144 and started on cardizem drip Has been on drip and on my  exam rate 90s-110.  With CHF exacerbation, will start her on metoprolol 12.5mg  BID and see if rate can be controlled Cardiology consulted, ? If candidate for conversion Checking TSH, continue telemetry   Echo pending   Acute exacerbation of CHF (congestive heart failure) ([place]) Patient with elevated BNP, CXR with interstitial edema, LE edema, orthopnea and respiratory failure with hypoxia consistent with CHF exacerbation in setting of afib with RVR Continue lasix 40mg  IV daily, monitor output  Echo ordered Strict I/O and daily weights Stopped cardizem, starting metoprolol BID  ? If uncontrolled HTN, age, afib, tobacco abuse all contributing factors.   Hypertension- (present on admission) Has been out of medication x 2 months ? If uncontrolled HTN contributing to possible CHF Blood pressure has been well controlled on cardizem Starting her on metoprolol BID, will monitor pressures and adjust medication as needed   Hypokalemia- (present on admission) repleted in ED with 34meq and repeat wnl On daily  8meq Monitor daily with IV diuresis   Tobacco abuse- (present on admission) States she is down to 1 a day, but needs a patch -nicotine patch and encouraged cessation.  -likely has undiagnosed COPD and recommend outpatient PFTs -xoponex prn   Anemia- (present on admission) States she has "had this for years." has history of fibroids.  Checking iron studies and fecal occult Never had colonoscopy History sounds like IBS with stomach pain relieved with diarrhea, but f/u on fecal occult     Advance Care Planning:   Code Status: Full Code Full  Consults: cardiology-Dr. Acie Fredrickson  Family Communication: none   Severity of Illness: The appropriate patient status for this patient is INPATIENT. Inpatient status is judged to be reasonable and necessary in order to provide the required intensity of service to ensure the patient's safety. The patient's presenting symptoms, physical exam findings, and initial radiographic and laboratory data in the context of their chronic comorbidities is felt to place them at high risk for further clinical deterioration. Furthermore, it is not anticipated that the patient will be medically stable for discharge from the hospital within 2 midnights of admission.   * I certify that at the point of admission it is my clinical judgment that the patient will require inpatient hospital care spanning beyond 2 midnights from the point of admission due to high intensity of service, high risk for further deterioration and high frequency of surveillance required.*  Author: Orma Flaming, MD 12/22/2021 4:26 PM  For on call review [email or web address].

## 2021-12-22 NOTE — Assessment & Plan Note (Addendum)
Acute diastolic CHF Moderate to severe MR -Volume status improved, diuresed with IV Lasix, she is - 7 L -2D echocardiogram noted EF of 55%, moderate PAH, moderate to severe MR and moderate TR -Transition to Lasix p.o. 40 Mg daily, Aldactone and Farxiga -Weaned off O2 at discharge -Follow-up with Dr. Acie Fredrickson in 1 to 2 weeks

## 2021-12-22 NOTE — Plan of Care (Signed)
°  Problem: Education: Goal: Ability to demonstrate management of disease process will improve Outcome: Progressing   Problem: Education: Goal: Knowledge of disease or condition will improve Outcome: Progressing   Problem: Cardiac: Goal: Ability to achieve and maintain adequate cardiopulmonary perfusion will improve Outcome: Progressing

## 2021-12-22 NOTE — ED Notes (Signed)
ED TO INPATIENT HANDOFF REPORT  ED Nurse Name and Phone #: Baxter Flattery, RN  S Name/Age/Gender [name] 76 y.o. female Room/Bed: MH03/MH03  Code Status   Code Status: Not on file  Home/SNF/Other Home Patient oriented to: self, place, time, and situation Is this baseline? Yes   Triage Complete: Triage complete  Chief Complaint Acute respiratory failure with hypoxia ([place]) [J96.01]  Triage Note Pt c/o SOB started 1/27-states she was seen by PCP 1/27 dx with Afib--states she does not have routine health care-c/o dry cough x 2 days-to triage in w/c-also c/o bilat LE swelling x 1 week-O2 sats high 80s-RT in for assessment-placed on O2 3LNC   Allergies No Known Allergies  Level of Care/Admitting Diagnosis ED Disposition     ED Disposition  Admit   Condition  --   Comment  Hospital Area: [facility] [[id number]]  Level of Care: Telemetry Cardiac [[id number]]  May admit patient to [facility] or [facility] if equivalent level of care is available:: No  Interfacility transfer: Yes  Covid Evaluation: Asymptomatic Screening Protocol (No Symptoms)  Diagnosis: Acute respiratory failure with hypoxia [facility]) [[id number]]  Admitting Physician: Norval Morton [[id number]]  Attending Physician: Norval Morton [[id number]]  Estimated length of stay: past midnight tomorrow  Certification:: I certify this patient will need inpatient services for at least 2 midnights          B Medical/Surgery History Past Medical History:  Diagnosis Date   A-fib ([place])    per pt   Fibroid    Hypertension    Past Surgical History:  Procedure Laterality Date   CESAREAN SECTION     CHOLECYSTECTOMY     DILATION AND CURETTAGE OF UTERUS     HYSTEROSCOPY       A IV Location/Drains/Wounds Patient Lines/Drains/Airways Status     Active Line/Drains/Airways     Name Placement date Placement time Site Days   Peripheral IV 12/21/21 20 G 1" Anterior;Right Forearm 12/21/21  1200  Forearm  1    Peripheral IV 12/21/21 20 G Left Antecubital 12/21/21  1310  Antecubital  1   External Urinary Catheter 12/21/21  1221  --  1            Intake/Output Last 24 hours  Intake/Output Summary (Last 24 hours) at 12/22/2021 0956 Last data filed at 12/21/2021 2322 Gross per 24 hour  Intake 109.67 ml  Output 1000 ml  Net -890.33 ml    Labs/Imaging Results for orders placed or performed during the hospital encounter of 12/21/21 (from the past 48 hour(s))  CBC with Differential     Status: Abnormal   Collection Time: 12/21/21 12:00 PM  Result Value Ref Range   WBC 6.4 4.0 - 10.5 K/uL   RBC 3.99 3.87 - 5.11 MIL/uL   Hemoglobin 10.6 (L) 12.0 - 15.0 g/dL   HCT 34.4 (L) 36.0 - 46.0 %   MCV 86.2 80.0 - 100.0 fL   MCH 26.6 26.0 - 34.0 pg   MCHC 30.8 30.0 - 36.0 g/dL   RDW 14.7 11.5 - 15.5 %   Platelets 305 150 - 400 K/uL   nRBC 0.0 0.0 - 0.2 %   Neutrophils Relative % 69 %   Neutro Abs 4.3 1.7 - 7.7 K/uL   Lymphocytes Relative 18 %   Lymphs Abs 1.1 0.7 - 4.0 K/uL   Monocytes Relative 13 %   Monocytes Absolute 0.8 0.1 - 1.0 K/uL   Eosinophils Relative 0 %  Eosinophils Absolute 0.0 0.0 - 0.5 K/uL   Basophils Relative 0 %   Basophils Absolute 0.0 0.0 - 0.1 K/uL   Immature Granulocytes 0 %   Abs Immature Granulocytes 0.02 0.00 - 0.07 K/uL    Comment: Performed at [facility], [address] [place], [place] [address]  Comprehensive metabolic panel     Status: Abnormal   Collection Time: 12/21/21 12:00 PM  Result Value Ref Range   Sodium 136 135 - 145 mmol/L   Potassium 3.0 (L) 3.5 - 5.1 mmol/L   Chloride 96 (L) 98 - 111 mmol/L   CO2 31 22 - 32 mmol/L   Glucose, Bld 141 (H) 70 - 99 mg/dL    Comment: Glucose reference range applies only to samples taken after fasting for at least 8 hours.   BUN 14 8 - 23 mg/dL   Creatinine, Ser 0.68 0.44 - 1.00 mg/dL   Calcium 8.7 (L) 8.9 - 10.3 mg/dL   Total Protein 6.8 6.5 - 8.1 g/dL   Albumin 3.6 3.5 - 5.0 g/dL   AST 19 15 - 41  U/L   ALT 10 0 - 44 U/L   Alkaline Phosphatase 84 38 - 126 U/L   Total Bilirubin 1.0 0.3 - 1.2 mg/dL   GFR, Estimated >60 >60 mL/min    Comment: (NOTE) Calculated using the CKD-EPI Creatinine Equation (2021)    Anion gap 9 5 - 15    Comment: Performed at [facility], [address] [place], [place] [address]  Brain natriuretic peptide     Status: Abnormal   Collection Time: 12/21/21 12:00 PM  Result Value Ref Range   B Natriuretic Peptide 333.8 (H) 0.0 - 100.0 pg/mL    Comment: Performed at [facility], [address] [place], [place] [address]  Magnesium     Status: None   Collection Time: 12/21/21 12:00 PM  Result Value Ref Range   Magnesium 1.7 1.7 - 2.4 mg/dL    Comment: Performed at [facility], [address] [place], [place] [address]  Resp Panel by RT-PCR (Flu A&B, Covid) Nasopharyngeal Swab     Status: None   Collection Time: 12/21/21  1:16 PM   Specimen: Nasopharyngeal Swab; Nasopharyngeal(NP) swabs in vial transport medium  Result Value Ref Range   SARS Coronavirus 2 by RT PCR NEGATIVE NEGATIVE    Comment: (NOTE) SARS-CoV-2 target nucleic acids are NOT DETECTED.  The SARS-CoV-2 RNA is generally detectable in upper respiratory specimens during the acute phase of infection. The lowest concentration of SARS-CoV-2 viral copies this assay can detect is 138 copies/mL. A negative result does not preclude SARS-Cov-2 infection and should not be used as the sole basis for treatment or other patient management decisions. A negative result may occur with  improper specimen collection/handling, submission of specimen other than nasopharyngeal swab, presence of viral mutation(s) within the areas targeted by this assay, and inadequate number of viral copies(<138 copies/mL). A negative result must be combined with clinical observations, patient history, and epidemiological information. The expected result is Negative.  Fact Sheet for  Patients:  EntrepreneurPulse.com.au  Fact Sheet for Healthcare Providers:  IncredibleEmployment.be  This test is no t yet approved or cleared by the [place] FDA and  has been authorized for detection and/or diagnosis of SARS-CoV-2 by FDA under an Emergency Use Authorization (EUA). This EUA will remain  in effect (meaning this test can be used) for the duration of  the COVID-19 declaration under Section 564(b)(1) of the Act, 21 U.S.C.section 360bbb-3(b)(1), unless the authorization is terminated  or revoked sooner.       Influenza A by PCR NEGATIVE NEGATIVE   Influenza B by PCR NEGATIVE NEGATIVE    Comment: (NOTE) The Xpert Xpress SARS-CoV-2/FLU/RSV plus assay is intended as an aid in the diagnosis of influenza from Nasopharyngeal swab specimens and should not be used as a sole basis for treatment. Nasal washings and aspirates are unacceptable for Xpert Xpress SARS-CoV-2/FLU/RSV testing.  Fact Sheet for Patients: EntrepreneurPulse.com.au  Fact Sheet for Healthcare Providers: IncredibleEmployment.be  This test is not yet approved or cleared by the [place] FDA and has been authorized for detection and/or diagnosis of SARS-CoV-2 by FDA under an Emergency Use Authorization (EUA). This EUA will remain in effect (meaning this test can be used) for the duration of the COVID-19 declaration under Section 564(b)(1) of the Act, 21 U.S.C. section 360bbb-3(b)(1), unless the authorization is terminated or revoked.  Performed at [facility], [address] [place], [place] [address]   Basic metabolic panel     Status: Abnormal   Collection Time: 12/22/21  8:41 AM  Result Value Ref Range   Sodium 136 135 - 145 mmol/L   Potassium 3.7 3.5 - 5.1 mmol/L    Comment: REPEATED TO VERIFY DELTA CHECK NOTED    Chloride 95 (L) 98 - 111 mmol/L   CO2 33 (H) 22 - 32 mmol/L   Glucose, Bld 110 (H) 70 - 99  mg/dL    Comment: Glucose reference range applies only to samples taken after fasting for at least 8 hours.   BUN 13 8 - 23 mg/dL   Creatinine, Ser 0.64 0.44 - 1.00 mg/dL   Calcium 8.4 (L) 8.9 - 10.3 mg/dL   GFR, Estimated >60 >60 mL/min    Comment: (NOTE) Calculated using the CKD-EPI Creatinine Equation (2021)    Anion gap 8 5 - 15    Comment: Performed at [facility], [address], [place], [place] [address]   DG Chest Portable 1 View  Result Date: 12/21/2021 CLINICAL DATA:  Dry cough and shortness of breath. EXAM: PORTABLE CHEST 1 VIEW COMPARISON:  None. FINDINGS: Mild cardiomegaly. Mild interstitial thickening at the right greater than left lung bases. No consolidation, pneumothorax, or large pleural effusion. No acute osseous abnormality. IMPRESSION: 1. Mild cardiomegaly with mild interstitial edema. Electronically Signed   By: Titus Dubin M.D.   On: 12/21/2021 13:05    Pending Labs Unresulted Labs (From admission, onward)    None       Vitals/Pain Today's Vitals   12/22/21 0730 12/22/21 0800 12/22/21 0830 12/22/21 0930  BP: 129/80  (!) 146/63 (!) 153/84  Pulse: 69 82 73 (!) 113  Resp: (!) 21 20 (!) 21 (!) 22  Temp:      TempSrc:      SpO2: 100% 98% 100% 98%  PainSc:        Isolation Precautions No active isolations  Medications Medications  diltiazem (CARDIZEM) 1 mg/mL load via infusion 15 mg (15 mg Intravenous Bolus from Bag 12/21/21 1313)    And  diltiazem (CARDIZEM) 125 mg in dextrose 5% 125 mL (1 mg/mL) infusion (0 mg/hr Intravenous Stopped 12/22/21 0759)  apixaban (ELIQUIS) tablet 5 mg (5 mg Oral Given 12/22/21 0926)  potassium chloride SA (KLOR-CON M) CR tablet 40 mEq (40 mEq Oral Given 12/22/21 0927)  acetaminophen (TYLENOL) tablet 650 mg (650 mg Oral  Given 12/22/21 0407)  furosemide (LASIX) injection 40 mg (40 mg Intravenous Given 12/21/21 1255)  potassium chloride SA (KLOR-CON M) CR tablet 40 mEq (40 mEq Oral Given 12/21/21 1255)  furosemide  (LASIX) injection 40 mg (40 mg Intravenous Given 12/22/21 0850)    Mobility walks Moderate fall risk   Focused Assessments Pulmonary Assessment Handoff:  Lung sounds:   O2 Device: Nasal Cannula O2 Flow Rate (L/min): 3 L/min    R Recommendations: See Admitting Provider Note  Report given to:   Additional Notes:

## 2021-12-23 ENCOUNTER — Inpatient Hospital Stay (HOSPITAL_COMMUNITY): Payer: PPO

## 2021-12-23 DIAGNOSIS — I509 Heart failure, unspecified: Secondary | ICD-10-CM

## 2021-12-23 DIAGNOSIS — I5021 Acute systolic (congestive) heart failure: Secondary | ICD-10-CM | POA: Diagnosis not present

## 2021-12-23 LAB — CBC
HCT: 32.2 % — ABNORMAL LOW (ref 36.0–46.0)
Hemoglobin: 9.5 g/dL — ABNORMAL LOW (ref 12.0–15.0)
MCH: 26 pg (ref 26.0–34.0)
MCHC: 29.5 g/dL — ABNORMAL LOW (ref 30.0–36.0)
MCV: 88.2 fL (ref 80.0–100.0)
Platelets: 266 10*3/uL (ref 150–400)
RBC: 3.65 MIL/uL — ABNORMAL LOW (ref 3.87–5.11)
RDW: 14.6 % (ref 11.5–15.5)
WBC: 7.2 10*3/uL (ref 4.0–10.5)
nRBC: 0 % (ref 0.0–0.2)

## 2021-12-23 LAB — ECHOCARDIOGRAM COMPLETE
Height: 64 in
MV M vel: 5.44 m/s
MV Peak grad: 118.2 mmHg
Radius: 0.2 cm
S' Lateral: 2.5 cm
Weight: 2208.13 oz

## 2021-12-23 LAB — BASIC METABOLIC PANEL
Anion gap: 8 (ref 5–15)
BUN: 10 mg/dL (ref 8–23)
CO2: 32 mmol/L (ref 22–32)
Calcium: 8.7 mg/dL — ABNORMAL LOW (ref 8.9–10.3)
Chloride: 94 mmol/L — ABNORMAL LOW (ref 98–111)
Creatinine, Ser: 0.64 mg/dL (ref 0.44–1.00)
GFR, Estimated: >60 mL/min (ref >60)
Glucose, Bld: 113 mg/dL — ABNORMAL HIGH (ref 70–99)
Potassium: 3.7 mmol/L (ref 3.5–5.1)
Sodium: 134 mmol/L — ABNORMAL LOW (ref 135–145)

## 2021-12-23 LAB — TROPONIN I (HIGH SENSITIVITY)
Troponin I (High Sensitivity): 12 ng/L (ref <18)
Troponin I (High Sensitivity): 13 ng/L (ref <18)

## 2021-12-23 LAB — HEMOGLOBIN A1C
Hgb A1c MFr Bld: 6.4 % — ABNORMAL HIGH (ref 4.8–5.6)
Mean Plasma Glucose: 137 mg/dL

## 2021-12-23 MED ORDER — METOPROLOL TARTRATE 25 MG PO TABS
25.0000 mg | ORAL_TABLET | Freq: Two times a day (BID) | ORAL | Status: DC
  Administered 2021-12-23: 25 mg via ORAL
  Filled 2021-12-23 (×2): qty 1

## 2021-12-23 MED ORDER — POTASSIUM CHLORIDE CRYS ER 20 MEQ PO TBCR
40.0000 meq | EXTENDED_RELEASE_TABLET | Freq: Every day | ORAL | Status: DC
  Administered 2021-12-23: 40 meq via ORAL
  Filled 2021-12-23 (×2): qty 2

## 2021-12-23 MED ORDER — PANTOPRAZOLE SODIUM 40 MG PO TBEC
40.0000 mg | DELAYED_RELEASE_TABLET | Freq: Once | ORAL | Status: AC
  Administered 2021-12-23: 40 mg via ORAL
  Filled 2021-12-23: qty 1

## 2021-12-23 MED ORDER — ALUM & MAG HYDROXIDE-SIMETH 200-200-20 MG/5ML PO SUSP
15.0000 mL | Freq: Four times a day (QID) | ORAL | Status: DC | PRN
  Administered 2021-12-23 – 2021-12-27 (×5): 15 mL via ORAL
  Filled 2021-12-23 (×5): qty 30

## 2021-12-23 MED ORDER — DIPHENHYDRAMINE HCL 25 MG PO CAPS
50.0000 mg | ORAL_CAPSULE | Freq: Once | ORAL | Status: AC
  Administered 2021-12-23: 50 mg via ORAL
  Filled 2021-12-23: qty 2

## 2021-12-23 MED ORDER — METOPROLOL TARTRATE 12.5 MG HALF TABLET
12.5000 mg | ORAL_TABLET | Freq: Once | ORAL | Status: AC
  Administered 2021-12-23: 12.5 mg via ORAL
  Filled 2021-12-23: qty 1

## 2021-12-23 MED ORDER — FUROSEMIDE 10 MG/ML IJ SOLN
40.0000 mg | Freq: Two times a day (BID) | INTRAMUSCULAR | Status: DC
  Administered 2021-12-23 – 2021-12-24 (×3): 40 mg via INTRAVENOUS
  Filled 2021-12-23 (×3): qty 4

## 2021-12-23 NOTE — Progress Notes (Signed)
°  Echocardiogram 2D Echocardiogram has been performed.  Kristina Martin 12/23/2021, 10:59 AM

## 2021-12-23 NOTE — Progress Notes (Signed)
PROGRESS NOTE    Kristina Martin  HGD:[id number] DOB: [birth year] DOA: 12/21/2021 PCP: Leonard Downing, MD  Brief Narrative: 75/F with history of longstanding tobacco use, COPD, hypertension was recently diagnosed with A. fib a week ago and started on Eliquis, she presents to the ED with progressive shortness of breath for a few weeks associated with leg swelling, orthopnea In the ED BNP was 333, chest x-ray noted cardiomegaly and interstitial edema, EKG with A. fib, heart rate in the 140s, she was started on Cardizem drip, given Lasix and admitted  Subjective: -Breathing improving, daughter reports progressive dyspnea for weeks  Assessment & Plan:  * Acute respiratory failure with hypoxia ([place])- (present on admission) Acute exacerbation of CHF -Also suspect a component of underlying COPD, long history of tobacco abuse -Continue IV Lasix today -2D echocardiogram pending -Continue metoprolol -Cardiology following, monitor I's/O, daily weights  Paroxysmal atrial fibrillation with RVR ([place])- (present on admission) -A. fib recently diagnosed at PCP office on Friday 12/17/21, started on Eliquis then  -Started on Cardizem drip in the ED, then transition to metoprolol  -Cardiology following, follow-up TSH and echocardiogram  -Plan for outpatient DC cardioversion after 3 weeks of anticoagulation   Tobacco abuse- (present on admission) Suspected COPD  -nicotine patch and encouraged cessation.  -recommend outpatient PFTs -xoponex prn   Anemia- (present on admission) Iron deficiency anemia  -Long history of anemia, history of fibroids, denies any active bleeding  -Never had a colonoscopy  -Discussed with patient and daughter, will refer to gastroenterology at discharge  Hypokalemia- (present on admission) Repleted, monitor  Hypertension- (present on admission) Diuretics, metoprolol as noted above  DVT prophylaxis: Eliquis Code Status: Full code Family Communication: Daughter  at bedside Disposition Plan:    Consultants:  Cardiology  Procedures:   Antimicrobials:    Objective: Vitals:   12/22/21 2000 12/23/21 0000 12/23/21 0258 12/23/21 0727  BP: 133/70 109/78 137/86 118/68  Pulse: 92  99 91  Resp: (!) 24 20 19  (!) 23  Temp: 98.1 F (36.7 C) 97.6 F (36.4 C) 97.9 F (36.6 C) (!) 97.5 F (36.4 C)  TempSrc: Oral Oral  Oral  SpO2: 100% 98% 100% 98%  Weight:   62.6 kg   Height:        Intake/Output Summary (Last 24 hours) at 12/23/2021 1112 Last data filed at 12/23/2021 0023 Gross per 24 hour  Intake 240 ml  Output 1500 ml  Net -1260 ml   Filed Weights   12/22/21 1300 12/23/21 0258  Weight: 61.2 kg 62.6 kg    Examination:  General exam: Appears calm and comfortable, chronically ill-appearing Respiratory system: Poor air movement bilaterally Cardiovascular system: S1-S2, irregularly irregular rhythm Abd: nondistended, soft and nontender.Normal bowel sounds heard. Central nervous system: Alert and oriented. No focal neurological deficits. Extremities: 1plus edema Skin: No rashes Psychiatry:  Mood & affect appropriate.     Data Reviewed:   CBC: Recent Labs  Lab 12/21/21 1200 12/22/21 1419 12/23/21 0221  WBC 6.4 6.0 7.2  NEUTROABS 4.3  --   --   HGB 10.6* 10.4* 9.5*  HCT 34.4* 34.7* 32.2*  MCV 86.2 87.6 88.2  PLT 305 286 962   Basic Metabolic Panel: Recent Labs  Lab 12/21/21 1200 12/22/21 0841 12/23/21 0221  NA 136 136 134*  K 3.0* 3.7 3.7  CL 96* 95* 94*  CO2 31 33* 32  GLUCOSE 141* 110* 113*  BUN 14 13 10   CREATININE 0.68 0.64 0.64  CALCIUM 8.7* 8.4* 8.7*  MG 1.7  --   --    GFR: Estimated Creatinine Clearance: 51.7 mL/min (by C-G formula based on SCr of 0.64 mg/dL). Liver Function Tests: Recent Labs  Lab 12/21/21 1200 12/22/21 1419  AST 19 17  ALT 10 10  ALKPHOS 84 81  BILITOT 1.0 1.0  PROT 6.8 6.8  ALBUMIN 3.6 3.4*   No results for input(s): LIPASE, AMYLASE in the last 168 hours. No results for  input(s): AMMONIA in the last 168 hours. Coagulation Profile: Recent Labs  Lab 12/22/21 1419  INR 1.4*   Cardiac Enzymes: No results for input(s): CKTOTAL, CKMB, CKMBINDEX, TROPONINI in the last 168 hours. BNP (last 3 results) No results for input(s): PROBNP in the last 8760 hours. HbA1C: Recent Labs    12/22/21 1419  HGBA1C 6.4*   CBG: No results for input(s): GLUCAP in the last 168 hours. Lipid Profile: No results for input(s): CHOL, HDL, LDLCALC, TRIG, CHOLHDL, LDLDIRECT in the last 72 hours. Thyroid Function Tests: Recent Labs    12/22/21 1419  TSH 0.449   Anemia Panel: Recent Labs    12/22/21 1419  FERRITIN 18  TIBC 508*  IRON 24*   Urine analysis:    Component Value Date/Time   COLORURINE YELLOW 05/15/2013 1155   APPEARANCEUR CLEAR 05/15/2013 1155   LABSPEC 1.010 05/15/2013 1155   PHURINE 7.0 05/15/2013 1155   GLUCOSEU NEG 05/15/2013 1155   HGBUR NEG 05/15/2013 1155   BILIRUBINUR NEG 05/15/2013 1155   KETONESUR NEG 05/15/2013 1155   PROTEINUR NEG 05/15/2013 1155   UROBILINOGEN 0.2 05/15/2013 1155   NITRITE NEG 05/15/2013 1155   LEUKOCYTESUR MOD (A) 05/15/2013 1155   Sepsis Labs: @LABRCNTIP (procalcitonin:4,lacticidven:4)  ) Recent Results (from the past 240 hour(s))  Resp Panel by RT-PCR (Flu A&B, Covid) Nasopharyngeal Swab     Status: None   Collection Time: 12/21/21  1:16 PM   Specimen: Nasopharyngeal Swab; Nasopharyngeal(NP) swabs in vial transport medium  Result Value Ref Range Status   SARS Coronavirus 2 by RT PCR NEGATIVE NEGATIVE Final    Comment: (NOTE) SARS-CoV-2 target nucleic acids are NOT DETECTED.  The SARS-CoV-2 RNA is generally detectable in upper respiratory specimens during the acute phase of infection. The lowest concentration of SARS-CoV-2 viral copies this assay can detect is 138 copies/mL. A negative result does not preclude SARS-Cov-2 infection and should not be used as the sole basis for treatment or other patient  management decisions. A negative result may occur with  improper specimen collection/handling, submission of specimen other than nasopharyngeal swab, presence of viral mutation(s) within the areas targeted by this assay, and inadequate number of viral copies(<138 copies/mL). A negative result must be combined with clinical observations, patient history, and epidemiological information. The expected result is Negative.  Fact Sheet for Patients:  EntrepreneurPulse.com.au  Fact Sheet for Healthcare Providers:  IncredibleEmployment.be  This test is no t yet approved or cleared by the [place] FDA and  has been authorized for detection and/or diagnosis of SARS-CoV-2 by FDA under an Emergency Use Authorization (EUA). This EUA will remain  in effect (meaning this test can be used) for the duration of the COVID-19 declaration under Section 564(b)(1) of the Act, 21 U.S.C.section 360bbb-3(b)(1), unless the authorization is terminated  or revoked sooner.       Influenza A by PCR NEGATIVE NEGATIVE Final   Influenza B by PCR NEGATIVE NEGATIVE Final    Comment: (NOTE) The Xpert Xpress SARS-CoV-2/FLU/RSV plus assay is intended as an aid in the diagnosis of  influenza from Nasopharyngeal swab specimens and should not be used as a sole basis for treatment. Nasal washings and aspirates are unacceptable for Xpert Xpress SARS-CoV-2/FLU/RSV testing.  Fact Sheet for Patients: EntrepreneurPulse.com.au  Fact Sheet for Healthcare Providers: IncredibleEmployment.be  This test is not yet approved or cleared by the [place] FDA and has been authorized for detection and/or diagnosis of SARS-CoV-2 by FDA under an Emergency Use Authorization (EUA). This EUA will remain in effect (meaning this test can be used) for the duration of the COVID-19 declaration under Section 564(b)(1) of the Act, 21 U.S.C. section 360bbb-3(b)(1),  unless the authorization is terminated or revoked.  Performed at [facility], [address] [place], [place] [address]      Radiology Studies: DG Chest Portable 1 View  Result Date: 12/21/2021 CLINICAL DATA:  Dry cough and shortness of breath. EXAM: PORTABLE CHEST 1 VIEW COMPARISON:  None. FINDINGS: Mild cardiomegaly. Mild interstitial thickening at the right greater than left lung bases. No consolidation, pneumothorax, or large pleural effusion. No acute osseous abnormality. IMPRESSION: 1. Mild cardiomegaly with mild interstitial edema. Electronically Signed   By: Titus Dubin M.D.   On: 12/21/2021 13:05     Scheduled Meds:  apixaban  5 mg Oral BID   furosemide  40 mg Intravenous BID   metoprolol tartrate  25 mg Oral BID   nicotine  7 mg Transdermal Daily   potassium chloride  40 mEq Oral Daily   sodium chloride flush  3 mL Intravenous Q12H   Continuous Infusions:  sodium chloride       LOS: 1 day    Time spent: 67min    Domenic Polite, MD [facility]   12/23/2021, 11:12 AM

## 2021-12-23 NOTE — TOC Progression Note (Signed)
Transition of Care [facility]) - Progression Note    Patient Details  Name: Kristina Martin MRN: [id number] Date of Birth: [birth year]  Transition of Care [facility]) CM/SW Contact  Zenon Mayo, RN Phone Number: 12/23/2021, 12:32 PM  Clinical Narrative:    From home, has new onset HF, afib RVR, echo pending, cont to diurese with IV lasix, conts on eliquis. TOC will continue to follow for dc needs.         Expected Discharge Plan and Services                                                 Social Determinants of Health (SDOH) Interventions    Readmission Risk Interventions No flowsheet data found.

## 2021-12-23 NOTE — Progress Notes (Signed)
Patient complains GI upset since shift change. She describes as heartburn at mid sternum. She took maalox at 1900 see mar. Didn't work as per Patient. Paged DR K and new orders received.   12/23/21 2030  Assess: MEWS Score  Temp (!) 97.5 F (36.4 C)  BP 118/84  Pulse Rate (!) 105  ECG Heart Rate (!) 119  Resp 20  Level of Consciousness Alert  SpO2 100 %  O2 Device Nasal Cannula  Patient Activity (if Appropriate) In bed  O2 Flow Rate (L/min) 3 L/min  Assess: MEWS Score  MEWS Temp 0  MEWS Systolic 0  MEWS Pulse 2  MEWS RR 0  MEWS LOC 0  MEWS Score 2  MEWS Score Color Yellow  Assess: if the MEWS score is Yellow or Red  Were vital signs taken at a resting state? Yes  Focused Assessment Change from prior assessment (see assessment flowsheet)  Early Detection of Sepsis Score *See Row Information* Low  MEWS guidelines implemented *See Row Information* Yes  Treat  Pain Scale 0-10  Pain Score 4  Pain Type Acute pain  Pain Location Abdomen  Pain Orientation Upper  Pain Intervention(s) Medication (See eMAR)  Patients response to intervention Effective  Take Vital Signs  Increase Vital Sign Frequency  Yellow: Q 2hr X 2 then Q 4hr X 2, if remains yellow, continue Q 4hrs  Escalate  MEWS: Escalate Yellow: discuss with charge nurse/RN and consider discussing with provider and RRT  Notify: Charge Nurse/RN  Name of Charge Nurse/RN Notified jequetta RN  Notify: Provider  Provider Name/Title DR Hal Hope  Date Provider Notified 12/23/21  Time Provider Notified 2040  Notification Type Page  Notification Reason Other (Comment) (GI upset, HR)  Provider response See new orders  Date of Provider Response 12/23/21  Time of Provider Response 2048  Document  Patient Outcome Not stable and remains on department

## 2021-12-23 NOTE — Progress Notes (Signed)
Mobility Specialist Progress Note:   12/23/21 1410  Mobility  Activity Transferred to/from [facility]  Level of Assistance Minimal assist, patient does 75% or more  Assistive Device  (HHA)  Distance Ambulated (ft) 4 ft  Activity Response Tolerated well  $Mobility charge 1 Mobility   Pt needing to get back to bed. No complaints of pain. MinA to pivot to bed. Left in bed with call bell in reach and all needs met.    [facility] Public librarian Phone [phone] Secondary Phone [phone]

## 2021-12-24 LAB — CBC
HCT: 31.7 % — ABNORMAL LOW (ref 36.0–46.0)
Hemoglobin: 9.4 g/dL — ABNORMAL LOW (ref 12.0–15.0)
MCH: 25.7 pg — ABNORMAL LOW (ref 26.0–34.0)
MCHC: 29.7 g/dL — ABNORMAL LOW (ref 30.0–36.0)
MCV: 86.6 fL (ref 80.0–100.0)
Platelets: 251 10*3/uL (ref 150–400)
RBC: 3.66 MIL/uL — ABNORMAL LOW (ref 3.87–5.11)
RDW: 14.7 % (ref 11.5–15.5)
WBC: 7.3 10*3/uL (ref 4.0–10.5)
nRBC: 0 % (ref 0.0–0.2)

## 2021-12-24 LAB — BASIC METABOLIC PANEL
Anion gap: 8 (ref 5–15)
BUN: 16 mg/dL (ref 8–23)
CO2: 35 mmol/L — ABNORMAL HIGH (ref 22–32)
Calcium: 8.4 mg/dL — ABNORMAL LOW (ref 8.9–10.3)
Chloride: 92 mmol/L — ABNORMAL LOW (ref 98–111)
Creatinine, Ser: 0.8 mg/dL (ref 0.44–1.00)
GFR, Estimated: >60 mL/min (ref >60)
Glucose, Bld: 106 mg/dL — ABNORMAL HIGH (ref 70–99)
Potassium: 4 mmol/L (ref 3.5–5.1)
Sodium: 135 mmol/L (ref 135–145)

## 2021-12-24 LAB — MAGNESIUM: Magnesium: 1.7 mg/dL (ref 1.7–2.4)

## 2021-12-24 MED ORDER — FUROSEMIDE 10 MG/ML IJ SOLN
60.0000 mg | Freq: Two times a day (BID) | INTRAMUSCULAR | Status: DC
  Administered 2021-12-24 – 2021-12-25 (×3): 60 mg via INTRAVENOUS
  Filled 2021-12-24 (×3): qty 6

## 2021-12-24 MED ORDER — SODIUM CHLORIDE 0.9 % IV SOLN
250.0000 mg | Freq: Every day | INTRAVENOUS | Status: AC
  Administered 2021-12-24 – 2021-12-25 (×2): 250 mg via INTRAVENOUS
  Filled 2021-12-24 (×2): qty 20

## 2021-12-24 MED ORDER — DIPHENHYDRAMINE HCL 25 MG PO CAPS
25.0000 mg | ORAL_CAPSULE | Freq: Once | ORAL | Status: AC
  Administered 2021-12-24: 25 mg via ORAL
  Filled 2021-12-24: qty 1

## 2021-12-24 MED ORDER — POTASSIUM CHLORIDE CRYS ER 20 MEQ PO TBCR
40.0000 meq | EXTENDED_RELEASE_TABLET | Freq: Two times a day (BID) | ORAL | Status: DC
  Administered 2021-12-24 – 2021-12-25 (×4): 40 meq via ORAL
  Filled 2021-12-24 (×3): qty 2

## 2021-12-24 MED ORDER — METOPROLOL TARTRATE 50 MG PO TABS
50.0000 mg | ORAL_TABLET | Freq: Two times a day (BID) | ORAL | Status: DC
  Administered 2021-12-24 – 2021-12-28 (×9): 50 mg via ORAL
  Filled 2021-12-24 (×9): qty 1

## 2021-12-24 NOTE — Progress Notes (Signed)
Progress Note  Patient Name: Kristina Martin Date of Encounter: 12/24/2021  Primary Cardiologist: Mertie Moores, MD   Subjective   Patient notes her SOB has improved but is not at baseline. Notes that she felt best 2 months prior with weight in 130s. Family notes she looks better this AM Sats to 89% on 1 L Echo showed preserved LVEF and moderate to severe MR.  Inpatient Medications    Scheduled Meds:  apixaban  5 mg Oral BID   furosemide  40 mg Intravenous BID   metoprolol tartrate  25 mg Oral BID   nicotine  7 mg Transdermal Daily   potassium chloride  40 mEq Oral Daily   sodium chloride flush  3 mL Intravenous Q12H   Continuous Infusions:  sodium chloride     PRN Meds: sodium chloride, acetaminophen, albuterol, alum & mag hydroxide-simeth, sodium chloride flush   Vital Signs    Vitals:   12/23/21 2030 12/23/21 2240 12/23/21 2300 12/24/21 0339  BP: 118/84 128/79  (!) 117/57  Pulse: (!) 105 99 93 80  Resp: 20 (!) 24 20 20   Temp: (!) 97.5 F (36.4 C) (!) 97.5 F (36.4 C)  98.1 F (36.7 C)  TempSrc: Oral Oral  Oral  SpO2: 100% 100% 100% 100%  Weight:    61.3 kg  Height:        Intake/Output Summary (Last 24 hours) at 12/24/2021 0076 Last data filed at 12/24/2021 0809 Gross per 24 hour  Intake 430 ml  Output 1150 ml  Net -720 ml   Filed Weights   12/22/21 1300 12/23/21 0258 12/24/21 0339  Weight: 61.2 kg 62.6 kg 61.3 kg    Telemetry    AF Rates ~80s-110s - Personally Reviewed  Physical Exam   Gen: No distress, elderly white female  Neck: No JVD,  Cardiac: No Rubs or Gallops, IRIR with holosystolic Murmur Respiratory: Crackles bilaterally with normal rate and effort GI: Soft, nontender, non-distended  MS: Non pitting edema;  moves all extremities Integument: Skin feels warm Neuro:  At time of evaluation, alert and oriented to person/place/time/situation  Psych: Normal affect, patient feels better   Labs    Chemistry Recent Labs  Lab  12/21/21 1200 12/22/21 0841 12/22/21 1419 12/23/21 0221 12/24/21 0143  NA 136 136  --  134* 135  K 3.0* 3.7  --  3.7 4.0  CL 96* 95*  --  94* 92*  CO2 31 33*  --  32 35*  GLUCOSE 141* 110*  --  113* 106*  BUN 14 13  --  10 16  CREATININE 0.68 0.64  --  0.64 0.80  CALCIUM 8.7* 8.4*  --  8.7* 8.4*  PROT 6.8  --  6.8  --   --   ALBUMIN 3.6  --  3.4*  --   --   AST 19  --  17  --   --   ALT 10  --  10  --   --   ALKPHOS 84  --  81  --   --   BILITOT 1.0  --  1.0  --   --   GFRNONAA >60 >60  --  >60 >60  ANIONGAP 9 8  --  8 8     Hematology Recent Labs  Lab 12/22/21 1419 12/23/21 0221 12/24/21 0143  WBC 6.0 7.2 7.3  RBC 3.96 3.65* 3.66*  HGB 10.4* 9.5* 9.4*  HCT 34.7* 32.2* 31.7*  MCV 87.6 88.2 86.6  MCH 26.3  26.0 25.7*  MCHC 30.0 29.5* 29.7*  RDW 14.5 14.6 14.7  PLT 286 266 251    Cardiac EnzymesNo results for input(s): TROPONINI in the last 168 hours. No results for input(s): TROPIPOC in the last 168 hours.   BNP Recent Labs  Lab 12/21/21 1200  BNP 333.8*     DDimer No results for input(s): DDIMER in the last 168 hours.   Radiology    ECHOCARDIOGRAM COMPLETE  Result Date: 12/23/2021    ECHOCARDIOGRAM REPORT   Patient Name:   Kristina Martin Date of Exam: 12/23/2021 Medical Rec #:  [id number]     Height:       64.0 in Accession #:    [id number]    Weight:       138.0 lb Date of Birth:  Jun 19, 1946    BSA:          1.671 m Patient Age:    76 years      BP:           118/68 mmHg Patient Gender: F             HR:           109 bpm. Exam Location:  Inpatient Procedure: 2D Echo Indications:    acute systolic chf  History:        Patient has no prior history of Echocardiogram examinations.                 Arrythmias:Atrial Fibrillation; Risk Factors:Hypertension and                 Current Smoker.  Sonographer:    Johny Chess RDCS Referring Phys: [id number] [place]  1. Left ventricular ejection fraction, by estimation, is 55 to 60%. The left ventricle has  normal function. The left ventricle has no regional wall motion abnormalities. There is mild concentric left ventricular hypertrophy. Left ventricular diastolic parameters are indeterminate.  2. Right ventricular systolic function is normal. The right ventricular size is normal. There is moderately elevated pulmonary artery systolic pressure.  3. Left atrial size was mildly dilated.  4. Right atrial size was moderately dilated.  5. There are multple jets of MR, central and posteriorly directed. . Moderate to severe mitral valve regurgitation.  6. Tricuspid valve regurgitation is moderate.  7. The aortic valve is tricuspid. Aortic valve regurgitation is not visualized. Aortic valve sclerosis/calcification is present, without any evidence of aortic stenosis.  8. The inferior vena cava is dilated in size with <50% respiratory variability, suggesting right atrial pressure of 15 mmHg. FINDINGS  Left Ventricle: Left ventricular ejection fraction, by estimation, is 55 to 60%. The left ventricle has normal function. The left ventricle has no regional wall motion abnormalities. The left ventricular internal cavity size was normal in size. There is  mild concentric left ventricular hypertrophy. Left ventricular diastolic parameters are indeterminate. Right Ventricle: The right ventricular size is normal. Right vetricular wall thickness was not assessed. Right ventricular systolic function is normal. There is moderately elevated pulmonary artery systolic pressure. The tricuspid regurgitant velocity is  3.75 m/s, and with an assumed right atrial pressure of 3 mmHg, the estimated right ventricular systolic pressure is 27.0 mmHg. Left Atrium: Left atrial size was mildly dilated. Right Atrium: Right atrial size was moderately dilated. Pericardium: There is no evidence of pericardial effusion. Mitral Valve: There are multple jets of MR, central and posteriorly directed. There is mild thickening of the mitral valve leaflet(s). There  is mild calcification  of the mitral valve leaflet(s). Mild to moderate mitral annular calcification. Moderate to severe mitral valve regurgitation. Tricuspid Valve: The tricuspid valve is normal in structure. Tricuspid valve regurgitation is moderate. Aortic Valve: The aortic valve is tricuspid. Aortic valve regurgitation is not visualized. Aortic valve sclerosis/calcification is present, without any evidence of aortic stenosis. Pulmonic Valve: The pulmonic valve was not well visualized. Pulmonic valve regurgitation is not visualized. Aorta: The aortic root and ascending aorta are structurally normal, with no evidence of dilitation. Venous: The inferior vena cava is dilated in size with less than 50% respiratory variability, suggesting right atrial pressure of 15 mmHg. IAS/Shunts: No atrial level shunt detected by color flow Doppler.  LEFT VENTRICLE PLAX 2D LVIDd:         3.50 cm LVIDs:         2.50 cm LV PW:         1.30 cm LV IVS:        1.20 cm LVOT diam:     1.70 cm LV SV:         32 LV SV Index:   19 LVOT Area:     2.27 cm  RIGHT VENTRICLE          IVC RV Basal diam:  2.70 cm  IVC diam: 2.10 cm LEFT ATRIUM             Index        RIGHT ATRIUM           Index LA diam:        3.60 cm 2.15 cm/m   RA Area:     20.50 cm LA Vol (A2C):   56.0 ml 33.51 ml/m  RA Volume:   61.00 ml  36.51 ml/m LA Vol (A4C):   50.6 ml 30.28 ml/m LA Biplane Vol: 55.4 ml 33.15 ml/m  AORTIC VALVE LVOT Vmax:   93.60 cm/s LVOT Vmean:  60.800 cm/s LVOT VTI:    0.143 m  AORTA Ao Root diam: 3.10 cm Ao Asc diam:  2.80 cm MR Peak grad:    118.2 mmHg   TRICUSPID VALVE MR Mean grad:    83.7 mmHg    TR Peak grad:   56.2 mmHg MR Vmax:         543.67 cm/s  TR Vmax:        375.00 cm/s MR Vmean:        437.0 cm/s MR PISA:         0.25 cm     SHUNTS MR PISA Eff ROA: 2 mm        Systemic VTI:  0.14 m MR PISA Radius:  0.20 cm      Systemic Diam: 1.70 cm Dorris Carnes MD Electronically signed by Dorris Carnes MD Signature Date/Time: 12/23/2021/2:04:14 PM     Final      Patient Profile     76 y.o. female hx of HTN, tobacco abuse, recently diagnosed afib on eliquis (diagnosed by PCP on 1/27) who presented with cough and LE edema found to have acute on chronic HFpEF exacerbation and Afib with RVR, with new MR found through course  Assessment & Plan    Heart Failure Preserved Ejection Fraction  - NYHA class III, Stage C, hypervolemic, etiology from AF - Diuretic regimen: Lasix 60 IV AF RVR CHADVASC 4 on eliquis HTN COPD and recent smoking cessation (this admission) Moderate to Severe MR - will increase to lasix 60 IV BID and increase K supplementation - continue eliquis -  will increase metoprolol to 50 mg PO BID - as outpatient TEE/DCCV may be considered for eval of MR and for rhythm control strategy - peridischarge will attempt aldactone - reviewed echo results with family   For questions or updates, please contact [place] Please consult [email or web address] for contact info under Cardiology/STEMI.      Signed, Werner Lean, MD  12/24/2021, 9:22 AM

## 2021-12-24 NOTE — Progress Notes (Addendum)
PROGRESS NOTE    ANNELI Martin  GDJ:[id number] DOB: 03/30/1946 DOA: 12/21/2021 PCP: Leonard Downing, MD  Brief Narrative: 76/F with history of longstanding tobacco use, COPD, hypertension was recently diagnosed with A. fib a week ago and started on Eliquis, she presents to the ED with progressive shortness of breath for a few weeks associated with leg swelling, orthopnea In the ED BNP was 333, chest x-ray noted cardiomegaly and interstitial edema, EKG with A. fib, heart rate in the 140s, she was started on Cardizem drip, given Lasix and admitted  Subjective: -Breathing improving, daughter reports progressive dyspnea for weeks  Assessment & Plan:  * Acute respiratory failure with hypoxia ([place])- (present on admission) Acute exacerbation of CHF -Also suspect a component of underlying COPD, long history of tobacco abuse -Volume status improving, continue IV Lasix today -2D echocardiogram noted EF of 55%, moderate PAH, moderate to severe MR and moderate TR -Continue metoprolol -Cardiology following, monitor I's/O, daily weights -Wean off O2  Paroxysmal atrial fibrillation with RVR ([place])- (present on admission) -A. fib recently diagnosed at PCP office on Friday 12/17/21, started on Eliquis then  -Started on Cardizem drip in the ED, then transitioned to metoprolol  -Cardiology following, echo with preserved EF -Metoprolol dose increased -Plan for outpatient DC cardioversion after 3 weeks of anticoagulation -Increase activity  Tobacco abuse- (present on admission) Suspected COPD  -nicotine patch and encouraged cessation.  -recommend outpatient PFTs -xoponex prn   Anemia- (present on admission) Iron deficiency anemia  -Long history of anemia, history of fibroids, denies any active bleeding  -Never had a colonoscopy  -Discussed with patient and daughter, will refer to gastroenterology at discharge -Add IV iron  Hypokalemia- (present on admission) Repleted,  monitor  Hypertension- (present on admission) Diuretics, metoprolol as noted above  DVT prophylaxis: Eliquis Code Status: Full code Family Communication: Daughter at bedside yesterday Disposition Plan: Home in 1 to 2 days   Consultants:  Cardiology  Procedures:   Antimicrobials:    Objective: Vitals:   12/23/21 2300 12/24/21 0339 12/24/21 0950 12/24/21 1119  BP:  (!) 117/57 120/61 102/69  Pulse: 93 80 99 81  Resp: 20 20  (!) 24  Temp:  98.1 F (36.7 C)  98 F (36.7 C)  TempSrc:  Oral  Oral  SpO2: 100% 100%  100%  Weight:  61.3 kg    Height:        Intake/Output Summary (Last 24 hours) at 12/24/2021 1213 Last data filed at 12/24/2021 1122 Gross per 24 hour  Intake 430 ml  Output 2050 ml  Net -1620 ml   Filed Weights   12/22/21 1300 12/23/21 0258 12/24/21 0339  Weight: 61.2 kg 62.6 kg 61.3 kg    Examination:  General exam: Pleasant elderly female sitting up in bed, chronically ill-appearing, AAO x3 HEENT: No JVD CVS: S1-S2, irregularly irregular rhythm Lungs: Few basilar rales Abdomen: Soft, nontender, bowel sounds present Extremities: Trace edema  Skin: No rashes Psychiatry:  Mood & affect appropriate.     Data Reviewed:   CBC: Recent Labs  Lab 12/21/21 1200 12/22/21 1419 12/23/21 0221 12/24/21 0143  WBC 6.4 6.0 7.2 7.3  NEUTROABS 4.3  --   --   --   HGB 10.6* 10.4* 9.5* 9.4*  HCT 34.4* 34.7* 32.2* 31.7*  MCV 86.2 87.6 88.2 86.6  PLT 305 286 266 622   Basic Metabolic Panel: Recent Labs  Lab 12/21/21 1200 12/22/21 0841 12/23/21 0221 12/24/21 0143  NA 136 136 134* 135  K  3.0* 3.7 3.7 4.0  CL 96* 95* 94* 92*  CO2 31 33* 32 35*  GLUCOSE 141* 110* 113* 106*  BUN 14 13 10 16   CREATININE 0.68 0.64 0.64 0.80  CALCIUM 8.7* 8.4* 8.7* 8.4*  MG 1.7  --   --  1.7   GFR: Estimated Creatinine Clearance: 51.7 mL/min (by C-G formula based on SCr of 0.8 mg/dL). Liver Function Tests: Recent Labs  Lab 12/21/21 1200 12/22/21 1419  AST 19 17   ALT 10 10  ALKPHOS 84 81  BILITOT 1.0 1.0  PROT 6.8 6.8  ALBUMIN 3.6 3.4*   No results for input(s): LIPASE, AMYLASE in the last 168 hours. No results for input(s): AMMONIA in the last 168 hours. Coagulation Profile: Recent Labs  Lab 12/22/21 1419  INR 1.4*   Cardiac Enzymes: No results for input(s): CKTOTAL, CKMB, CKMBINDEX, TROPONINI in the last 168 hours. BNP (last 3 results) No results for input(s): PROBNP in the last 8760 hours. HbA1C: Recent Labs    12/22/21 1419  HGBA1C 6.4*   CBG: No results for input(s): GLUCAP in the last 168 hours. Lipid Profile: No results for input(s): CHOL, HDL, LDLCALC, TRIG, CHOLHDL, LDLDIRECT in the last 72 hours. Thyroid Function Tests: Recent Labs    12/22/21 1419  TSH 0.449   Anemia Panel: Recent Labs    12/22/21 1419  FERRITIN 18  TIBC 508*  IRON 24*   Urine analysis:    Component Value Date/Time   COLORURINE YELLOW 05/15/2013 1155   APPEARANCEUR CLEAR 05/15/2013 1155   LABSPEC 1.010 05/15/2013 1155   PHURINE 7.0 05/15/2013 1155   GLUCOSEU NEG 05/15/2013 1155   HGBUR NEG 05/15/2013 1155   BILIRUBINUR NEG 05/15/2013 1155   KETONESUR NEG 05/15/2013 1155   PROTEINUR NEG 05/15/2013 1155   UROBILINOGEN 0.2 05/15/2013 1155   NITRITE NEG 05/15/2013 1155   LEUKOCYTESUR MOD (A) 05/15/2013 1155   Sepsis Labs: @LABRCNTIP (procalcitonin:4,lacticidven:4)  ) Recent Results (from the past 240 hour(s))  Resp Panel by RT-PCR (Flu A&B, Covid) Nasopharyngeal Swab     Status: None   Collection Time: 12/21/21  1:16 PM   Specimen: Nasopharyngeal Swab; Nasopharyngeal(NP) swabs in vial transport medium  Result Value Ref Range Status   SARS Coronavirus 2 by RT PCR NEGATIVE NEGATIVE Final    Comment: (NOTE) SARS-CoV-2 target nucleic acids are NOT DETECTED.  The SARS-CoV-2 RNA is generally detectable in upper respiratory specimens during the acute phase of infection. The lowest concentration of SARS-CoV-2 viral copies this assay can  detect is 138 copies/mL. A negative result does not preclude SARS-Cov-2 infection and should not be used as the sole basis for treatment or other patient management decisions. A negative result may occur with  improper specimen collection/handling, submission of specimen other than nasopharyngeal swab, presence of viral mutation(s) within the areas targeted by this assay, and inadequate number of viral copies(<138 copies/mL). A negative result must be combined with clinical observations, patient history, and epidemiological information. The expected result is Negative.  Fact Sheet for Patients:  EntrepreneurPulse.com.au  Fact Sheet for Healthcare Providers:  IncredibleEmployment.be  This test is no t yet approved or cleared by the [place] FDA and  has been authorized for detection and/or diagnosis of SARS-CoV-2 by FDA under an Emergency Use Authorization (EUA). This EUA will remain  in effect (meaning this test can be used) for the duration of the COVID-19 declaration under Section 564(b)(1) of the Act, 21 U.S.C.section 360bbb-3(b)(1), unless the authorization is terminated  or revoked sooner.  Influenza A by PCR NEGATIVE NEGATIVE Final   Influenza B by PCR NEGATIVE NEGATIVE Final    Comment: (NOTE) The Xpert Xpress SARS-CoV-2/FLU/RSV plus assay is intended as an aid in the diagnosis of influenza from Nasopharyngeal swab specimens and should not be used as a sole basis for treatment. Nasal washings and aspirates are unacceptable for Xpert Xpress SARS-CoV-2/FLU/RSV testing.  Fact Sheet for Patients: EntrepreneurPulse.com.au  Fact Sheet for Healthcare Providers: IncredibleEmployment.be  This test is not yet approved or cleared by the [place] FDA and has been authorized for detection and/or diagnosis of SARS-CoV-2 by FDA under an Emergency Use Authorization (EUA). This EUA will remain in  effect (meaning this test can be used) for the duration of the COVID-19 declaration under Section 564(b)(1) of the Act, 21 U.S.C. section 360bbb-3(b)(1), unless the authorization is terminated or revoked.  Performed at [facility], [address] [place], [place] [address]      Radiology Studies: ECHOCARDIOGRAM COMPLETE  Result Date: 12/23/2021    ECHOCARDIOGRAM REPORT   Patient Name:   Kristina Martin Date of Exam: 12/23/2021 Medical Rec #:  [id number]     Height:       64.0 in Accession #:    [id number]    Weight:       138.0 lb Date of Birth:  09-Jan-1946    BSA:          1.671 m Patient Age:    52 years      BP:           118/68 mmHg Patient Gender: F             HR:           109 bpm. Exam Location:  Inpatient Procedure: 2D Echo Indications:    acute systolic chf  History:        Patient has no prior history of Echocardiogram examinations.                 Arrythmias:Atrial Fibrillation; Risk Factors:Hypertension and                 Current Smoker.  Sonographer:    Johny Chess RDCS Referring Phys: [id number] [place]  1. Left ventricular ejection fraction, by estimation, is 55 to 60%. The left ventricle has normal function. The left ventricle has no regional wall motion abnormalities. There is mild concentric left ventricular hypertrophy. Left ventricular diastolic parameters are indeterminate.  2. Right ventricular systolic function is normal. The right ventricular size is normal. There is moderately elevated pulmonary artery systolic pressure.  3. Left atrial size was mildly dilated.  4. Right atrial size was moderately dilated.  5. There are multple jets of MR, central and posteriorly directed. . Moderate to severe mitral valve regurgitation.  6. Tricuspid valve regurgitation is moderate.  7. The aortic valve is tricuspid. Aortic valve regurgitation is not visualized. Aortic valve sclerosis/calcification is present, without any evidence of aortic stenosis.  8. The  inferior vena cava is dilated in size with <50% respiratory variability, suggesting right atrial pressure of 15 mmHg. FINDINGS  Left Ventricle: Left ventricular ejection fraction, by estimation, is 55 to 60%. The left ventricle has normal function. The left ventricle has no regional wall motion abnormalities. The left ventricular internal cavity size was normal in size. There is  mild concentric left ventricular hypertrophy. Left ventricular diastolic parameters are indeterminate. Right Ventricle: The right ventricular size is normal. Right vetricular wall thickness was  not assessed. Right ventricular systolic function is normal. There is moderately elevated pulmonary artery systolic pressure. The tricuspid regurgitant velocity is  3.75 m/s, and with an assumed right atrial pressure of 3 mmHg, the estimated right ventricular systolic pressure is 41.9 mmHg. Left Atrium: Left atrial size was mildly dilated. Right Atrium: Right atrial size was moderately dilated. Pericardium: There is no evidence of pericardial effusion. Mitral Valve: There are multple jets of MR, central and posteriorly directed. There is mild thickening of the mitral valve leaflet(s). There is mild calcification of the mitral valve leaflet(s). Mild to moderate mitral annular calcification. Moderate to severe mitral valve regurgitation. Tricuspid Valve: The tricuspid valve is normal in structure. Tricuspid valve regurgitation is moderate. Aortic Valve: The aortic valve is tricuspid. Aortic valve regurgitation is not visualized. Aortic valve sclerosis/calcification is present, without any evidence of aortic stenosis. Pulmonic Valve: The pulmonic valve was not well visualized. Pulmonic valve regurgitation is not visualized. Aorta: The aortic root and ascending aorta are structurally normal, with no evidence of dilitation. Venous: The inferior vena cava is dilated in size with less than 50% respiratory variability, suggesting right atrial pressure of 15  mmHg. IAS/Shunts: No atrial level shunt detected by color flow Doppler.  LEFT VENTRICLE PLAX 2D LVIDd:         3.50 cm LVIDs:         2.50 cm LV PW:         1.30 cm LV IVS:        1.20 cm LVOT diam:     1.70 cm LV SV:         32 LV SV Index:   19 LVOT Area:     2.27 cm  RIGHT VENTRICLE          IVC RV Basal diam:  2.70 cm  IVC diam: 2.10 cm LEFT ATRIUM             Index        RIGHT ATRIUM           Index LA diam:        3.60 cm 2.15 cm/m   RA Area:     20.50 cm LA Vol (A2C):   56.0 ml 33.51 ml/m  RA Volume:   61.00 ml  36.51 ml/m LA Vol (A4C):   50.6 ml 30.28 ml/m LA Biplane Vol: 55.4 ml 33.15 ml/m  AORTIC VALVE LVOT Vmax:   93.60 cm/s LVOT Vmean:  60.800 cm/s LVOT VTI:    0.143 m  AORTA Ao Root diam: 3.10 cm Ao Asc diam:  2.80 cm MR Peak grad:    118.2 mmHg   TRICUSPID VALVE MR Mean grad:    83.7 mmHg    TR Peak grad:   56.2 mmHg MR Vmax:         543.67 cm/s  TR Vmax:        375.00 cm/s MR Vmean:        437.0 cm/s MR PISA:         0.25 cm     SHUNTS MR PISA Eff ROA: 2 mm        Systemic VTI:  0.14 m MR PISA Radius:  0.20 cm      Systemic Diam: 1.70 cm Dorris Carnes MD Electronically signed by Dorris Carnes MD Signature Date/Time: 12/23/2021/2:04:14 PM    Final      Scheduled Meds:  apixaban  5 mg Oral BID   furosemide  60 mg Intravenous BID  metoprolol tartrate  50 mg Oral BID   nicotine  7 mg Transdermal Daily   potassium chloride  40 mEq Oral BID   sodium chloride flush  3 mL Intravenous Q12H   Continuous Infusions:  sodium chloride       LOS: 2 days    Time spent: 87min    Domenic Polite, MD [facility]   12/24/2021, 12:13 PM

## 2021-12-24 NOTE — Progress Notes (Signed)
Mobility Specialist Progress Note:   12/24/21 1342  Mobility  Bed Position Chair  Activity Ambulated with assistance in hallway  Level of Assistance Minimal assist, patient does 75% or more  Assistive Device Front wheel walker  Distance Ambulated (ft) 200 ft  Activity Response Tolerated well  $Mobility charge 1 Mobility   Pt received in chair willing to participate in mobility. No complaints of pain and asymptomatic. MinA to stand from recliner but then stand-by throughout ambulation. Pt left in chair with call bell in reach and all needs met.    [facility] Public librarian Phone [phone] Secondary Phone [phone]

## 2021-12-25 DIAGNOSIS — I4891 Unspecified atrial fibrillation: Secondary | ICD-10-CM

## 2021-12-25 LAB — BASIC METABOLIC PANEL
Anion gap: 8 (ref 5–15)
BUN: 14 mg/dL (ref 8–23)
CO2: 36 mmol/L — ABNORMAL HIGH (ref 22–32)
Calcium: 8.6 mg/dL — ABNORMAL LOW (ref 8.9–10.3)
Chloride: 90 mmol/L — ABNORMAL LOW (ref 98–111)
Creatinine, Ser: 0.81 mg/dL (ref 0.44–1.00)
GFR, Estimated: >60 mL/min (ref >60)
Glucose, Bld: 102 mg/dL — ABNORMAL HIGH (ref 70–99)
Potassium: 4.3 mmol/L (ref 3.5–5.1)
Sodium: 134 mmol/L — ABNORMAL LOW (ref 135–145)

## 2021-12-25 LAB — CBC
HCT: 34.4 % — ABNORMAL LOW (ref 36.0–46.0)
Hemoglobin: 10.6 g/dL — ABNORMAL LOW (ref 12.0–15.0)
MCH: 26.9 pg (ref 26.0–34.0)
MCHC: 30.8 g/dL (ref 30.0–36.0)
MCV: 87.3 fL (ref 80.0–100.0)
Platelets: 260 10*3/uL (ref 150–400)
RBC: 3.94 MIL/uL (ref 3.87–5.11)
RDW: 14.6 % (ref 11.5–15.5)
WBC: 7.3 10*3/uL (ref 4.0–10.5)
nRBC: 0 % (ref 0.0–0.2)

## 2021-12-25 MED ORDER — DIPHENHYDRAMINE HCL 25 MG PO CAPS
50.0000 mg | ORAL_CAPSULE | Freq: Once | ORAL | Status: AC
  Administered 2021-12-25: 25 mg via ORAL
  Filled 2021-12-25: qty 2

## 2021-12-25 MED ORDER — MAGNESIUM SULFATE 2 GM/50ML IV SOLN
2.0000 g | Freq: Once | INTRAVENOUS | Status: AC
  Administered 2021-12-25: 2 g via INTRAVENOUS
  Filled 2021-12-25: qty 50

## 2021-12-25 NOTE — Progress Notes (Signed)
° ° °  I received a call from the patient's nurse that she had 7 beats NSVT and was asymptomatic. K+ is at 4.3 today but magnesium was at 1.7 on 12/24/2021. Will order magnesium supplementation and repeat Mg level in AM to try to keep Mg ~ 2.0.  Signed, Erma Heritage, PA-C 12/25/2021, 12:25 PM

## 2021-12-25 NOTE — Progress Notes (Signed)
Patient have 7 beats of VT, asymptomatic, VSS. Cardiology PA notified, see epic for new order. Will continue to monitor the patient.

## 2021-12-25 NOTE — Progress Notes (Signed)
PROGRESS NOTE    Kristina Martin  [name] DOB: [birth year] DOA: 12/21/2021 PCP: Leonard Downing, MD  Brief Narrative: 75/F with history of longstanding tobacco use, COPD, hypertension was recently diagnosed with A. fib a week ago and started on Eliquis, she presents to the ED with progressive shortness of breath for a few weeks associated with leg swelling, orthopnea In the ED BNP was 333, chest x-ray noted cardiomegaly and interstitial edema, EKG with A. fib, heart rate in the 140s, she was started on Cardizem drip, given Lasix and admitted  Subjective: -Feels better overall, breathing is improving  Assessment & Plan:  * Acute respiratory failure with hypoxia ([place])- (present on admission) Acute diastolic CHF -Volume status improving, she is -5.3 L -2D echocardiogram noted EF of 55%, moderate PAH, moderate to severe MR and moderate TR -Continue metoprolol, could switch to p.o. Lasix today -Cardiology following, monitor I's/O, daily weights -Wean off O2, discharge planning  Paroxysmal atrial fibrillation with RVR ([place])- (present on admission) -A. fib recently diagnosed at PCP office on Friday 12/17/21, started on Eliquis then  -Started on Cardizem drip in the ED, then transitioned to metoprolol  -Cardiology following, echo with preserved EF -Metoprolol dose increased -Plan for outpatient DC cardioversion after 3 weeks of anticoagulation  Tobacco abuse- (present on admission) Suspected COPD  -nicotine patch and encouraged cessation.  -recommend outpatient PFTs -xoponex prn   Anemia- (present on admission) Iron deficiency anemia  -Long history of anemia, history of fibroids, denies any active bleeding  -Never had a colonoscopy  -Discussed with patient and daughter, will refer to gastroenterology at discharge -Continue IV iron  Hypokalemia- (present on admission) Repleted, monitor  Hypertension- (present on admission) Stable, continue diuretics, metoprolol as noted  above  DVT prophylaxis: Eliquis Code Status: Full code Family Communication: Daughter at bedside yesterday Disposition Plan: Home tomorrow if stable   Consultants:  Cardiology  Procedures:   Antimicrobials:    Objective: Vitals:   12/24/21 1943 12/25/21 0400 12/25/21 0404 12/25/21 0856  BP: 117/78 113/71    Pulse:  75    Resp: 20 20  19   Temp: 98.3 F (36.8 C) 97.9 F (36.6 C)  98.1 F (36.7 C)  TempSrc: Oral Oral  Oral  SpO2: 95% 100%  93%  Weight:   58.2 kg   Height:        Intake/Output Summary (Last 24 hours) at 12/25/2021 1056 Last data filed at 12/25/2021 0746 Gross per 24 hour  Intake 571.99 ml  Output 1950 ml  Net -1378.01 ml   Filed Weights   12/23/21 0258 12/24/21 0339 12/25/21 0404  Weight: 62.6 kg 61.3 kg 58.2 kg    Examination:  General exam: Pleasant elderly female sitting up in bed, chronically ill-appearing, AAOx3, no distress HEENT: No JVD CVS: S1-S2, irregularly irregular rhythm Lungs: Few basilar rales Abdomen: Soft, nontender, bowel sounds present Extremities: Trace edema  Skin: No rashes Psychiatry:  Mood & affect appropriate.     Data Reviewed:   CBC: Recent Labs  Lab 12/21/21 1200 12/22/21 1419 12/23/21 0221 12/24/21 0143 12/25/21 0321  WBC 6.4 6.0 7.2 7.3 7.3  NEUTROABS 4.3  --   --   --   --   HGB 10.6* 10.4* 9.5* 9.4* 10.6*  HCT 34.4* 34.7* 32.2* 31.7* 34.4*  MCV 86.2 87.6 88.2 86.6 87.3  PLT 305 286 266 251 811   Basic Metabolic Panel: Recent Labs  Lab 12/21/21 1200 12/22/21 0841 12/23/21 0221 12/24/21 0143 12/25/21 0321  NA 136  136 134* 135 134*  K 3.0* 3.7 3.7 4.0 4.3  CL 96* 95* 94* 92* 90*  CO2 31 33* 32 35* 36*  GLUCOSE 141* 110* 113* 106* 102*  BUN 14 13 10 16 14   CREATININE 0.68 0.64 0.64 0.80 0.81  CALCIUM 8.7* 8.4* 8.7* 8.4* 8.6*  MG 1.7  --   --  1.7  --    GFR: Estimated Creatinine Clearance: 51 mL/min (by C-G formula based on SCr of 0.81 mg/dL). Liver Function Tests: Recent Labs  Lab  12/21/21 1200 12/22/21 1419  AST 19 17  ALT 10 10  ALKPHOS 84 81  BILITOT 1.0 1.0  PROT 6.8 6.8  ALBUMIN 3.6 3.4*   No results for input(s): LIPASE, AMYLASE in the last 168 hours. No results for input(s): AMMONIA in the last 168 hours. Coagulation Profile: Recent Labs  Lab 12/22/21 1419  INR 1.4*   Cardiac Enzymes: No results for input(s): CKTOTAL, CKMB, CKMBINDEX, TROPONINI in the last 168 hours. BNP (last 3 results) No results for input(s): PROBNP in the last 8760 hours. HbA1C: Recent Labs    12/22/21 1419  HGBA1C 6.4*   CBG: No results for input(s): GLUCAP in the last 168 hours. Lipid Profile: No results for input(s): CHOL, HDL, LDLCALC, TRIG, CHOLHDL, LDLDIRECT in the last 72 hours. Thyroid Function Tests: Recent Labs    12/22/21 1419  TSH 0.449   Anemia Panel: Recent Labs    12/22/21 1419  FERRITIN 18  TIBC 508*  IRON 24*   Urine analysis:    Component Value Date/Time   COLORURINE YELLOW 05/15/2013 1155   APPEARANCEUR CLEAR 05/15/2013 1155   LABSPEC 1.010 05/15/2013 1155   PHURINE 7.0 05/15/2013 1155   GLUCOSEU NEG 05/15/2013 1155   HGBUR NEG 05/15/2013 1155   BILIRUBINUR NEG 05/15/2013 1155   KETONESUR NEG 05/15/2013 1155   PROTEINUR NEG 05/15/2013 1155   UROBILINOGEN 0.2 05/15/2013 1155   NITRITE NEG 05/15/2013 1155   LEUKOCYTESUR MOD (A) 05/15/2013 1155   Sepsis Labs: @LABRCNTIP (procalcitonin:4,lacticidven:4)  ) Recent Results (from the past 240 hour(s))  Resp Panel by RT-PCR (Flu A&B, Covid) Nasopharyngeal Swab     Status: None   Collection Time: 12/21/21  1:16 PM   Specimen: Nasopharyngeal Swab; Nasopharyngeal(NP) swabs in vial transport medium  Result Value Ref Range Status   SARS Coronavirus 2 by RT PCR NEGATIVE NEGATIVE Final    Comment: (NOTE) SARS-CoV-2 target nucleic acids are NOT DETECTED.  The SARS-CoV-2 RNA is generally detectable in upper respiratory specimens during the acute phase of infection. The lowest concentration  of SARS-CoV-2 viral copies this assay can detect is 138 copies/mL. A negative result does not preclude SARS-Cov-2 infection and should not be used as the sole basis for treatment or other patient management decisions. A negative result may occur with  improper specimen collection/handling, submission of specimen other than nasopharyngeal swab, presence of viral mutation(s) within the areas targeted by this assay, and inadequate number of viral copies(<138 copies/mL). A negative result must be combined with clinical observations, patient history, and epidemiological information. The expected result is Negative.  Fact Sheet for Patients:  EntrepreneurPulse.com.au  Fact Sheet for Healthcare Providers:  IncredibleEmployment.be  This test is no t yet approved or cleared by the [place] FDA and  has been authorized for detection and/or diagnosis of SARS-CoV-2 by FDA under an Emergency Use Authorization (EUA). This EUA will remain  in effect (meaning this test can be used) for the duration of the COVID-19 declaration under Section  564(b)(1) of the Act, 21 U.S.C.section 360bbb-3(b)(1), unless the authorization is terminated  or revoked sooner.       Influenza A by PCR NEGATIVE NEGATIVE Final   Influenza B by PCR NEGATIVE NEGATIVE Final    Comment: (NOTE) The Xpert Xpress SARS-CoV-2/FLU/RSV plus assay is intended as an aid in the diagnosis of influenza from Nasopharyngeal swab specimens and should not be used as a sole basis for treatment. Nasal washings and aspirates are unacceptable for Xpert Xpress SARS-CoV-2/FLU/RSV testing.  Fact Sheet for Patients: EntrepreneurPulse.com.au  Fact Sheet for Healthcare Providers: IncredibleEmployment.be  This test is not yet approved or cleared by the [place] FDA and has been authorized for detection and/or diagnosis of SARS-CoV-2 by FDA under an Emergency Use  Authorization (EUA). This EUA will remain in effect (meaning this test can be used) for the duration of the COVID-19 declaration under Section 564(b)(1) of the Act, 21 U.S.C. section 360bbb-3(b)(1), unless the authorization is terminated or revoked.  Performed at [facility], [address] [place], [place] [address]      Radiology Studies: ECHOCARDIOGRAM COMPLETE  Result Date: 12/23/2021    ECHOCARDIOGRAM REPORT   Patient Name:   Kristina Martin Date of Exam: 12/23/2021 Medical Rec #:  [id number]     Height:       64.0 in Accession #:    [id number]    Weight:       138.0 lb Date of Birth:  September 14, 1946    BSA:          1.671 m Patient Age:    65 years      BP:           118/68 mmHg Patient Gender: F             HR:           109 bpm. Exam Location:  Inpatient Procedure: 2D Echo Indications:    acute systolic chf  History:        Patient has no prior history of Echocardiogram examinations.                 Arrythmias:Atrial Fibrillation; Risk Factors:Hypertension and                 Current Smoker.  Sonographer:    Johny Chess RDCS Referring Phys: [id number] [place]  1. Left ventricular ejection fraction, by estimation, is 55 to 60%. The left ventricle has normal function. The left ventricle has no regional wall motion abnormalities. There is mild concentric left ventricular hypertrophy. Left ventricular diastolic parameters are indeterminate.  2. Right ventricular systolic function is normal. The right ventricular size is normal. There is moderately elevated pulmonary artery systolic pressure.  3. Left atrial size was mildly dilated.  4. Right atrial size was moderately dilated.  5. There are multple jets of MR, central and posteriorly directed. . Moderate to severe mitral valve regurgitation.  6. Tricuspid valve regurgitation is moderate.  7. The aortic valve is tricuspid. Aortic valve regurgitation is not visualized. Aortic valve sclerosis/calcification is present, without  any evidence of aortic stenosis.  8. The inferior vena cava is dilated in size with <50% respiratory variability, suggesting right atrial pressure of 15 mmHg. FINDINGS  Left Ventricle: Left ventricular ejection fraction, by estimation, is 55 to 60%. The left ventricle has normal function. The left ventricle has no regional wall motion abnormalities. The left ventricular internal cavity size was normal in size. There is  mild concentric  left ventricular hypertrophy. Left ventricular diastolic parameters are indeterminate. Right Ventricle: The right ventricular size is normal. Right vetricular wall thickness was not assessed. Right ventricular systolic function is normal. There is moderately elevated pulmonary artery systolic pressure. The tricuspid regurgitant velocity is  3.75 m/s, and with an assumed right atrial pressure of 3 mmHg, the estimated right ventricular systolic pressure is 47.4 mmHg. Left Atrium: Left atrial size was mildly dilated. Right Atrium: Right atrial size was moderately dilated. Pericardium: There is no evidence of pericardial effusion. Mitral Valve: There are multple jets of MR, central and posteriorly directed. There is mild thickening of the mitral valve leaflet(s). There is mild calcification of the mitral valve leaflet(s). Mild to moderate mitral annular calcification. Moderate to severe mitral valve regurgitation. Tricuspid Valve: The tricuspid valve is normal in structure. Tricuspid valve regurgitation is moderate. Aortic Valve: The aortic valve is tricuspid. Aortic valve regurgitation is not visualized. Aortic valve sclerosis/calcification is present, without any evidence of aortic stenosis. Pulmonic Valve: The pulmonic valve was not well visualized. Pulmonic valve regurgitation is not visualized. Aorta: The aortic root and ascending aorta are structurally normal, with no evidence of dilitation. Venous: The inferior vena cava is dilated in size with less than 50% respiratory  variability, suggesting right atrial pressure of 15 mmHg. IAS/Shunts: No atrial level shunt detected by color flow Doppler.  LEFT VENTRICLE PLAX 2D LVIDd:         3.50 cm LVIDs:         2.50 cm LV PW:         1.30 cm LV IVS:        1.20 cm LVOT diam:     1.70 cm LV SV:         32 LV SV Index:   19 LVOT Area:     2.27 cm  RIGHT VENTRICLE          IVC RV Basal diam:  2.70 cm  IVC diam: 2.10 cm LEFT ATRIUM             Index        RIGHT ATRIUM           Index LA diam:        3.60 cm 2.15 cm/m   RA Area:     20.50 cm LA Vol (A2C):   56.0 ml 33.51 ml/m  RA Volume:   61.00 ml  36.51 ml/m LA Vol (A4C):   50.6 ml 30.28 ml/m LA Biplane Vol: 55.4 ml 33.15 ml/m  AORTIC VALVE LVOT Vmax:   93.60 cm/s LVOT Vmean:  60.800 cm/s LVOT VTI:    0.143 m  AORTA Ao Root diam: 3.10 cm Ao Asc diam:  2.80 cm MR Peak grad:    118.2 mmHg   TRICUSPID VALVE MR Mean grad:    83.7 mmHg    TR Peak grad:   56.2 mmHg MR Vmax:         543.67 cm/s  TR Vmax:        375.00 cm/s MR Vmean:        437.0 cm/s MR PISA:         0.25 cm     SHUNTS MR PISA Eff ROA: 2 mm        Systemic VTI:  0.14 m MR PISA Radius:  0.20 cm      Systemic Diam: 1.70 cm Dorris Carnes MD Electronically signed by Dorris Carnes MD Signature Date/Time: 12/23/2021/2:04:14 PM    Final  Scheduled Meds:  apixaban  5 mg Oral BID   furosemide  60 mg Intravenous BID   metoprolol tartrate  50 mg Oral BID   nicotine  7 mg Transdermal Daily   potassium chloride  40 mEq Oral BID   sodium chloride flush  3 mL Intravenous Q12H   Continuous Infusions:  sodium chloride     ferric gluconate (FERRLECIT) IVPB 250 mg (12/25/21 0949)     LOS: 3 days    Time spent: 57min    Domenic Polite, MD [facility]   12/25/2021, 10:56 AM

## 2021-12-25 NOTE — Progress Notes (Signed)
Progress Note  Patient Name: Kristina Martin Date of Encounter: 12/25/2021  Primary Cardiologist: Mertie Moores, MD   Subjective   Sitting in a chair, no distress. On room air Renal function is stable   Inpatient Medications    Scheduled Meds:  apixaban  5 mg Oral BID   furosemide  60 mg Intravenous BID   metoprolol tartrate  50 mg Oral BID   nicotine  7 mg Transdermal Daily   potassium chloride  40 mEq Oral BID   sodium chloride flush  3 mL Intravenous Q12H   Continuous Infusions:  sodium chloride     ferric gluconate (FERRLECIT) IVPB 250 mg (12/25/21 0949)   PRN Meds: sodium chloride, acetaminophen, albuterol, alum & mag hydroxide-simeth, sodium chloride flush   Vital Signs    Vitals:   12/25/21 0400 12/25/21 0404 12/25/21 0856 12/25/21 1107  BP: 113/71   125/77  Pulse: 75   89  Resp: 20  19 19   Temp: 97.9 F (36.6 C)  98.1 F (36.7 C) 98.6 F (37 C)  TempSrc: Oral  Oral Oral  SpO2: 100%  93% 97%  Weight:  58.2 kg    Height:        Intake/Output Summary (Last 24 hours) at 12/25/2021 1117 Last data filed at 12/25/2021 0746 Gross per 24 hour  Intake 571.99 ml  Output 1950 ml  Net -1378.01 ml   Filed Weights   12/23/21 0258 12/24/21 0339 12/25/21 0404  Weight: 62.6 kg 61.3 kg 58.2 kg    Telemetry    AF Rate controlled - Personally Reviewed  Physical Exam   Gen: No distress, elderly white female  Neck: No JVD,  Cardiac: No Rubs or Gallops, IRIR  Respiratory: nl wob, decreased BS BL with Crackles  GI: Soft, nontender, non-distended  MS: Non pitting edema;  moves all extremities Integument: Skin feels warm Neuro:  At time of evaluation, alert and oriented to person/place/time/situation  Psych: Normal affect, patient feels better   Labs    Chemistry Recent Labs  Lab 12/21/21 1200 12/22/21 0841 12/22/21 1419 12/23/21 0221 12/24/21 0143 12/25/21 0321  NA 136   < >  --  134* 135 134*  K 3.0*   < >  --  3.7 4.0 4.3  CL 96*   < >  --  94*  92* 90*  CO2 31   < >  --  32 35* 36*  GLUCOSE 141*   < >  --  113* 106* 102*  BUN 14   < >  --  10 16 14   CREATININE 0.68   < >  --  0.64 0.80 0.81  CALCIUM 8.7*   < >  --  8.7* 8.4* 8.6*  PROT 6.8  --  6.8  --   --   --   ALBUMIN 3.6  --  3.4*  --   --   --   AST 19  --  17  --   --   --   ALT 10  --  10  --   --   --   ALKPHOS 84  --  81  --   --   --   BILITOT 1.0  --  1.0  --   --   --   GFRNONAA >60   < >  --  >60 >60 >60  ANIONGAP 9   < >  --  8 8 8    < > = values in this interval not  displayed.     Hematology Recent Labs  Lab 12/23/21 0221 12/24/21 0143 12/25/21 0321  WBC 7.2 7.3 7.3  RBC 3.65* 3.66* 3.94  HGB 9.5* 9.4* 10.6*  HCT 32.2* 31.7* 34.4*  MCV 88.2 86.6 87.3  MCH 26.0 25.7* 26.9  MCHC 29.5* 29.7* 30.8  RDW 14.6 14.7 14.6  PLT 266 251 260    Cardiac EnzymesNo results for input(s): TROPONINI in the last 168 hours. No results for input(s): TROPIPOC in the last 168 hours.   BNP Recent Labs  Lab 12/21/21 1200  BNP 333.8*     DDimer No results for input(s): DDIMER in the last 168 hours.   Radiology    No results found.  TTE 12/23/2021 1. Left ventricular ejection fraction, by estimation, is 55 to 60%. The  left ventricle has normal function. The left ventricle has no regional  wall motion abnormalities. There is mild concentric left ventricular  hypertrophy. Left ventricular diastolic  parameters are indeterminate.   2. Right ventricular systolic function is normal. The right ventricular  size is normal. There is moderately elevated pulmonary artery systolic  pressure.   3. Left atrial size was mildly dilated.   4. Right atrial size was moderately dilated.   5. There are multple jets of MR, central and posteriorly directed. .  Moderate to severe mitral valve regurgitation.   6. Tricuspid valve regurgitation is moderate.   7. The aortic valve is tricuspid. Aortic valve regurgitation is not  visualized. Aortic valve sclerosis/calcification is  present, without any  evidence of aortic stenosis.   8. The inferior vena cava is dilated in size with <50% respiratory  variability, suggesting right atrial pressure of 15 mmHg.    Patient Profile     76 y.o. female hx of HTN, tobacco abuse, recently diagnosed afib on eliquis (diagnosed by PCP on 1/27) who presented with cough and LE edema found to have acute on chronic HFpEF exacerbation and Afib with RVR, with new MR found through course  Assessment & Plan   Problem List Heart Failure Preserved Ejection Fraction  - NYHA class III, Stage C, hypervolemic, etiology from AF - Diuretic regimen: Lasix 60 IV AF RVR CHADVASC 4 on eliquis HTN COPD and recent smoking cessation (this admission) Moderate to Severe MR  Heart Failure Preserved Ejection Fraction : hypervolemic. In the setting of moderate to severe MR - net negative 2.5L - will continue  lasix 60 IV BID and increase K supplementation - consider TEE outpatient per below  AF RVR CHADVASC 4 on eliquis - rate controlled - continue metop 50 mg BID - continue eliquis - will increase metoprolol to 50 mg PO BID - as outpatient TEE/DCCV may be considered for eval of MR and for rhythm control strategy - peridischarge will attempt aldactone - reviewed echo results with family   For questions or updates, please contact [facility] Please consult [email or web address] for contact info under Cardiology/STEMI.      Signed, Janina Mayo, MD  12/25/2021, 11:17 AM

## 2021-12-26 LAB — BASIC METABOLIC PANEL
Anion gap: 8 (ref 5–15)
BUN: 11 mg/dL (ref 8–23)
CO2: 35 mmol/L — ABNORMAL HIGH (ref 22–32)
Calcium: 8.9 mg/dL (ref 8.9–10.3)
Chloride: 91 mmol/L — ABNORMAL LOW (ref 98–111)
Creatinine, Ser: 0.81 mg/dL (ref 0.44–1.00)
GFR, Estimated: >60 mL/min (ref >60)
Glucose, Bld: 111 mg/dL — ABNORMAL HIGH (ref 70–99)
Potassium: 4.4 mmol/L (ref 3.5–5.1)
Sodium: 134 mmol/L — ABNORMAL LOW (ref 135–145)

## 2021-12-26 LAB — CBC
HCT: 35.8 % — ABNORMAL LOW (ref 36.0–46.0)
Hemoglobin: 11 g/dL — ABNORMAL LOW (ref 12.0–15.0)
MCH: 26.4 pg (ref 26.0–34.0)
MCHC: 30.7 g/dL (ref 30.0–36.0)
MCV: 85.9 fL (ref 80.0–100.0)
Platelets: 295 10*3/uL (ref 150–400)
RBC: 4.17 MIL/uL (ref 3.87–5.11)
RDW: 14.6 % (ref 11.5–15.5)
WBC: 8 10*3/uL (ref 4.0–10.5)
nRBC: 0 % (ref 0.0–0.2)

## 2021-12-26 LAB — MAGNESIUM: Magnesium: 2.4 mg/dL (ref 1.7–2.4)

## 2021-12-26 MED ORDER — POTASSIUM CHLORIDE CRYS ER 20 MEQ PO TBCR
40.0000 meq | EXTENDED_RELEASE_TABLET | Freq: Once | ORAL | Status: AC
  Administered 2021-12-26: 40 meq via ORAL
  Filled 2021-12-26: qty 2

## 2021-12-26 MED ORDER — PANTOPRAZOLE SODIUM 40 MG PO TBEC
40.0000 mg | DELAYED_RELEASE_TABLET | Freq: Every day | ORAL | Status: DC
  Administered 2021-12-26 – 2021-12-28 (×3): 40 mg via ORAL
  Filled 2021-12-26 (×3): qty 1

## 2021-12-26 MED ORDER — FUROSEMIDE 10 MG/ML IJ SOLN
40.0000 mg | Freq: Two times a day (BID) | INTRAMUSCULAR | Status: DC
  Administered 2021-12-26 – 2021-12-28 (×5): 40 mg via INTRAVENOUS
  Filled 2021-12-26 (×5): qty 4

## 2021-12-26 MED ORDER — MELATONIN 3 MG PO TABS
3.0000 mg | ORAL_TABLET | Freq: Every day | ORAL | Status: DC
  Administered 2021-12-26 – 2021-12-27 (×2): 3 mg via ORAL
  Filled 2021-12-26 (×2): qty 1

## 2021-12-26 NOTE — Progress Notes (Signed)
Pt desats when asleep to 85% RA. 2 liters O2 placed.

## 2021-12-26 NOTE — Progress Notes (Signed)
Progress Note  Patient Name: Kristina Martin Date of Encounter: 12/26/2021  Primary Cardiologist: Mertie Moores, MD   Subjective   In bed no distress Desatted to 80s off O2 overnight On 2L Renal function is stable   Inpatient Medications    Scheduled Meds:  apixaban  5 mg Oral BID   metoprolol tartrate  50 mg Oral BID   nicotine  7 mg Transdermal Daily   sodium chloride flush  3 mL Intravenous Q12H   Continuous Infusions:  sodium chloride     PRN Meds: sodium chloride, acetaminophen, albuterol, alum & mag hydroxide-simeth, sodium chloride flush   Vital Signs    Vitals:   12/25/21 1750 12/25/21 1926 12/26/21 0100 12/26/21 0400  BP:  139/83  (!) 101/51  Pulse: 80     Resp:  17  20  Temp:  98.5 F (36.9 C)  97.6 F (36.4 C)  TempSrc:  Oral  Oral  SpO2:  91%  90%  Weight:   57.3 kg   Height:        Intake/Output Summary (Last 24 hours) at 12/26/2021 0901 Last data filed at 12/26/2021 0700 Gross per 24 hour  Intake 526 ml  Output 600 ml  Net -74 ml   Filed Weights   12/24/21 0339 12/25/21 0404 12/26/21 0100  Weight: 61.3 kg 58.2 kg 57.3 kg    Telemetry    AF Rate controlled - Personally Reviewed  Physical Exam   Gen: No distress, elderly white female  Neck: mild JVD,  Cardiac: No Rubs or Gallops, IRIR  Respiratory: nl wob, decreased BS BL with Crackles  GI: Soft, nontender, non-distended  MS: trace edema;  moves all extremities Integument: Skin feels warm Neuro:  At time of evaluation, alert and oriented to person/place/time/situation  Psych: Normal affect, patient feels better   Labs    Chemistry Recent Labs  Lab 12/21/21 1200 12/22/21 0841 12/22/21 1419 12/23/21 0221 12/24/21 0143 12/25/21 0321 12/26/21 0322  NA 136   < >  --    < > 135 134* 134*  K 3.0*   < >  --    < > 4.0 4.3 4.4  CL 96*   < >  --    < > 92* 90* 91*  CO2 31   < >  --    < > 35* 36* 35*  GLUCOSE 141*   < >  --    < > 106* 102* 111*  BUN 14   < >  --    < > 16 14 11    CREATININE 0.68   < >  --    < > 0.80 0.81 0.81  CALCIUM 8.7*   < >  --    < > 8.4* 8.6* 8.9  PROT 6.8  --  6.8  --   --   --   --   ALBUMIN 3.6  --  3.4*  --   --   --   --   AST 19  --  17  --   --   --   --   ALT 10  --  10  --   --   --   --   ALKPHOS 84  --  81  --   --   --   --   BILITOT 1.0  --  1.0  --   --   --   --   GFRNONAA >60   < >  --    < > >  60 >60 >60  ANIONGAP 9   < >  --    < > 8 8 8    < > = values in this interval not displayed.     Hematology Recent Labs  Lab 12/24/21 0143 12/25/21 0321 12/26/21 0322  WBC 7.3 7.3 8.0  RBC 3.66* 3.94 4.17  HGB 9.4* 10.6* 11.0*  HCT 31.7* 34.4* 35.8*  MCV 86.6 87.3 85.9  MCH 25.7* 26.9 26.4  MCHC 29.7* 30.8 30.7  RDW 14.7 14.6 14.6  PLT 251 260 295    Cardiac EnzymesNo results for input(s): TROPONINI in the last 168 hours. No results for input(s): TROPIPOC in the last 168 hours.   BNP Recent Labs  Lab 12/21/21 1200  BNP 333.8*     DDimer No results for input(s): DDIMER in the last 168 hours.   Radiology    No results found.  TTE 12/23/2021 1. Left ventricular ejection fraction, by estimation, is 55 to 60%. The  left ventricle has normal function. The left ventricle has no regional  wall motion abnormalities. There is mild concentric left ventricular  hypertrophy. Left ventricular diastolic  parameters are indeterminate.   2. Right ventricular systolic function is normal. The right ventricular  size is normal. There is moderately elevated pulmonary artery systolic  pressure.   3. Left atrial size was mildly dilated.   4. Right atrial size was moderately dilated.   5. There are multple jets of MR, central and posteriorly directed. .  Moderate to severe mitral valve regurgitation.   6. Tricuspid valve regurgitation is moderate.   7. The aortic valve is tricuspid. Aortic valve regurgitation is not  visualized. Aortic valve sclerosis/calcification is present, without any  evidence of aortic stenosis.   8.  The inferior vena cava is dilated in size with <50% respiratory  variability, suggesting right atrial pressure of 15 mmHg.    Patient Profile     76 y.o. female hx of HTN, tobacco abuse, recently diagnosed afib on eliquis (diagnosed by PCP on 1/27) who presented with cough and LE edema found to have acute on chronic HFpEF exacerbation and Afib with RVR, with new MR found through course  Assessment & Plan   Problem List Heart Failure Preserved Ejection Fraction  - NYHA class III, Stage C, hypervolemic, etiology from AF - Diuretic regimen: Lasix 60 IV AF RVR CHADVASC 4 on eliquis HTN COPD and recent smoking cessation (this admission) Moderate to Severe MR  Heart Failure Preserved Ejection Fraction : hypervolemic. In the setting of moderate to severe MR - net positive 166 - can continue lasix 40 mg IV BID ; plan to wean O2 off today. Likely can transition to PO soon  - consider TEE outpatient per below  AF RVR CHADVASC 4 on eliquis - rate controlled - continue metop 50 mg BID - continue eliquis - will increase metoprolol to 50 mg PO BID - as outpatient TEE/DCCV may be considered for eval of MR and for rhythm control strategy - peridischarge will attempt aldactone - reviewed echo results with family   For questions or updates, please contact [place] Please consult [email or web address] for contact info under Cardiology/STEMI.      Signed, Janina Mayo, MD  12/26/2021, 9:01 AM

## 2021-12-26 NOTE — Progress Notes (Signed)
PROGRESS NOTE    Kristina Martin  QAS:[id number] DOB: 1946-10-03 DOA: 12/21/2021 PCP: Leonard Downing, MD  Brief Narrative: 75/F with history of longstanding tobacco use, COPD, hypertension was recently diagnosed with A. fib a week ago and started on Eliquis, she presents to the ED with progressive shortness of breath for a few weeks associated with leg swelling, orthopnea In the ED BNP was 333, chest x-ray noted cardiomegaly and interstitial edema, EKG with A. fib, heart rate in the 140s, she was started on Cardizem drip, Lasix  Subjective: -Feels better overall, breathing continues to improve, back on O2 today  Assessment & Plan:  * Acute respiratory failure with hypoxia ([place])- (present on admission) Acute diastolic CHF Moderate to severe MR -Volume status improving, she is -5.3 L -2D echocardiogram noted EF of 55%, moderate PAH, moderate to severe MR and moderate TR -Continue metoprolol, continue IV Lasix 1 more day, transition to p.o. tomorrow -Cardiology following, monitor urine output -Attempt to wean off O2, home tomorrow if stable  Paroxysmal atrial fibrillation with RVR ([place])- (present on admission) -A. fib recently diagnosed at PCP office on Friday 12/17/21, started on Eliquis then  -Started on Cardizem drip in the ED, then transitioned to metoprolol  -Cardiology following, echo with preserved EF -Metoprolol dose increased -Plan for outpatient DC cardioversion after 3 weeks of anticoagulation  Tobacco abuse- (present on admission) Suspected COPD  -nicotine patch and encouraged cessation.  -recommend outpatient PFTs -xoponex prn   Anemia- (present on admission) Iron deficiency anemia  -Long history of anemia, history of fibroids, denies any active bleeding  -Never had a colonoscopy  -Discussed with patient and daughter, will refer to gastroenterology at discharge -Continue IV iron  Hypokalemia- (present on admission) Repleted, monitor  Hypertension- (present  on admission) Stable, continue diuretics, metoprolol as noted above  DVT prophylaxis: Eliquis Code Status: Full code Family Communication: Daughter at bedside yesterday Disposition Plan: Home tomorrow if stable   Consultants:  Cardiology  Procedures:   Antimicrobials:    Objective: Vitals:   12/26/21 0100 12/26/21 0400 12/26/21 0800 12/26/21 1125  BP:  (!) 101/51 133/65 118/66  Pulse:    77  Resp:  20 17 19   Temp:  97.6 F (36.4 C)  97.9 F (36.6 C)  TempSrc:  Oral  Oral  SpO2:  90%  92%  Weight: 57.3 kg     Height:        Intake/Output Summary (Last 24 hours) at 12/26/2021 1212 Last data filed at 12/26/2021 1054 Gross per 24 hour  Intake 649 ml  Output 1100 ml  Net -451 ml   Filed Weights   12/24/21 0339 12/25/21 0404 12/26/21 0100  Weight: 61.3 kg 58.2 kg 57.3 kg    Examination:  General exam: Pleasant elderly female sitting up in bed, chronically ill-appearing, AAOx3, no distress HEENT: No JVD CVS: S1-S2, irregularly irregular rhythm Lungs: Few basilar rales Abdomen: Soft, nontender, bowel sounds present Extremities: Trace edema  Skin: No rashes Psychiatry:  Mood & affect appropriate.     Data Reviewed:   CBC: Recent Labs  Lab 12/21/21 1200 12/22/21 1419 12/23/21 0221 12/24/21 0143 12/25/21 0321 12/26/21 0322  WBC 6.4 6.0 7.2 7.3 7.3 8.0  NEUTROABS 4.3  --   --   --   --   --   HGB 10.6* 10.4* 9.5* 9.4* 10.6* 11.0*  HCT 34.4* 34.7* 32.2* 31.7* 34.4* 35.8*  MCV 86.2 87.6 88.2 86.6 87.3 85.9  PLT 305 286 266 251 260 295  Basic Metabolic Panel: Recent Labs  Lab 12/21/21 1200 12/22/21 0841 12/23/21 0221 12/24/21 0143 12/25/21 0321 12/26/21 0322  NA 136 136 134* 135 134* 134*  K 3.0* 3.7 3.7 4.0 4.3 4.4  CL 96* 95* 94* 92* 90* 91*  CO2 31 33* 32 35* 36* 35*  GLUCOSE 141* 110* 113* 106* 102* 111*  BUN 14 13 10 16 14 11   CREATININE 0.68 0.64 0.64 0.80 0.81 0.81  CALCIUM 8.7* 8.4* 8.7* 8.4* 8.6* 8.9  MG 1.7  --   --  1.7  --  2.4    GFR: Estimated Creatinine Clearance: 51 mL/min (by C-G formula based on SCr of 0.81 mg/dL). Liver Function Tests: Recent Labs  Lab 12/21/21 1200 12/22/21 1419  AST 19 17  ALT 10 10  ALKPHOS 84 81  BILITOT 1.0 1.0  PROT 6.8 6.8  ALBUMIN 3.6 3.4*   No results for input(s): LIPASE, AMYLASE in the last 168 hours. No results for input(s): AMMONIA in the last 168 hours. Coagulation Profile: Recent Labs  Lab 12/22/21 1419  INR 1.4*   Cardiac Enzymes: No results for input(s): CKTOTAL, CKMB, CKMBINDEX, TROPONINI in the last 168 hours. BNP (last 3 results) No results for input(s): PROBNP in the last 8760 hours. HbA1C: No results for input(s): HGBA1C in the last 72 hours.  CBG: No results for input(s): GLUCAP in the last 168 hours. Lipid Profile: No results for input(s): CHOL, HDL, LDLCALC, TRIG, CHOLHDL, LDLDIRECT in the last 72 hours. Thyroid Function Tests: No results for input(s): TSH, T4TOTAL, FREET4, T3FREE, THYROIDAB in the last 72 hours.  Anemia Panel: No results for input(s): VITAMINB12, FOLATE, FERRITIN, TIBC, IRON, RETICCTPCT in the last 72 hours.  Urine analysis:    Component Value Date/Time   COLORURINE YELLOW 05/15/2013 1155   APPEARANCEUR CLEAR 05/15/2013 1155   LABSPEC 1.010 05/15/2013 1155   PHURINE 7.0 05/15/2013 1155   GLUCOSEU NEG 05/15/2013 1155   HGBUR NEG 05/15/2013 1155   BILIRUBINUR NEG 05/15/2013 1155   KETONESUR NEG 05/15/2013 1155   PROTEINUR NEG 05/15/2013 1155   UROBILINOGEN 0.2 05/15/2013 1155   NITRITE NEG 05/15/2013 1155   LEUKOCYTESUR MOD (A) 05/15/2013 1155   Sepsis Labs: @LABRCNTIP (procalcitonin:4,lacticidven:4)  ) Recent Results (from the past 240 hour(s))  Resp Panel by RT-PCR (Flu A&B, Covid) Nasopharyngeal Swab     Status: None   Collection Time: 12/21/21  1:16 PM   Specimen: Nasopharyngeal Swab; Nasopharyngeal(NP) swabs in vial transport medium  Result Value Ref Range Status   SARS Coronavirus 2 by RT PCR NEGATIVE  NEGATIVE Final    Comment: (NOTE) SARS-CoV-2 target nucleic acids are NOT DETECTED.  The SARS-CoV-2 RNA is generally detectable in upper respiratory specimens during the acute phase of infection. The lowest concentration of SARS-CoV-2 viral copies this assay can detect is 138 copies/mL. A negative result does not preclude SARS-Cov-2 infection and should not be used as the sole basis for treatment or other patient management decisions. A negative result may occur with  improper specimen collection/handling, submission of specimen other than nasopharyngeal swab, presence of viral mutation(s) within the areas targeted by this assay, and inadequate number of viral copies(<138 copies/mL). A negative result must be combined with clinical observations, patient history, and epidemiological information. The expected result is Negative.  Fact Sheet for Patients:  EntrepreneurPulse.com.au  Fact Sheet for Healthcare Providers:  IncredibleEmployment.be  This test is no t yet approved or cleared by the [place] FDA and  has been authorized for detection and/or diagnosis of  SARS-CoV-2 by FDA under an Emergency Use Authorization (EUA). This EUA will remain  in effect (meaning this test can be used) for the duration of the COVID-19 declaration under Section 564(b)(1) of the Act, 21 U.S.C.section 360bbb-3(b)(1), unless the authorization is terminated  or revoked sooner.       Influenza A by PCR NEGATIVE NEGATIVE Final   Influenza B by PCR NEGATIVE NEGATIVE Final    Comment: (NOTE) The Xpert Xpress SARS-CoV-2/FLU/RSV plus assay is intended as an aid in the diagnosis of influenza from Nasopharyngeal swab specimens and should not be used as a sole basis for treatment. Nasal washings and aspirates are unacceptable for Xpert Xpress SARS-CoV-2/FLU/RSV testing.  Fact Sheet for Patients: EntrepreneurPulse.com.au  Fact Sheet for Healthcare  Providers: IncredibleEmployment.be  This test is not yet approved or cleared by the [place] FDA and has been authorized for detection and/or diagnosis of SARS-CoV-2 by FDA under an Emergency Use Authorization (EUA). This EUA will remain in effect (meaning this test can be used) for the duration of the COVID-19 declaration under Section 564(b)(1) of the Act, 21 U.S.C. section 360bbb-3(b)(1), unless the authorization is terminated or revoked.  Performed at [facility], [address], [place], [place] [address]      Radiology Studies: No results found.   Scheduled Meds:  apixaban  5 mg Oral BID   furosemide  40 mg Intravenous BID   metoprolol tartrate  50 mg Oral BID   nicotine  7 mg Transdermal Daily   sodium chloride flush  3 mL Intravenous Q12H   Continuous Infusions:  sodium chloride       LOS: 4 days    Time spent: 51min  Domenic Polite, MD [facility]   12/26/2021, 12:12 PM

## 2021-12-27 ENCOUNTER — Inpatient Hospital Stay (HOSPITAL_COMMUNITY): Payer: PPO

## 2021-12-27 ENCOUNTER — Other Ambulatory Visit (HOSPITAL_COMMUNITY): Payer: Self-pay

## 2021-12-27 DIAGNOSIS — I5043 Acute on chronic combined systolic (congestive) and diastolic (congestive) heart failure: Secondary | ICD-10-CM

## 2021-12-27 DIAGNOSIS — Z720 Tobacco use: Secondary | ICD-10-CM

## 2021-12-27 LAB — BASIC METABOLIC PANEL
Anion gap: 9 (ref 5–15)
BUN: 12 mg/dL (ref 8–23)
CO2: 31 mmol/L (ref 22–32)
Calcium: 8.7 mg/dL — ABNORMAL LOW (ref 8.9–10.3)
Chloride: 90 mmol/L — ABNORMAL LOW (ref 98–111)
Creatinine, Ser: 0.8 mg/dL (ref 0.44–1.00)
GFR, Estimated: >60 mL/min (ref >60)
Glucose, Bld: 95 mg/dL (ref 70–99)
Potassium: 4.4 mmol/L (ref 3.5–5.1)
Sodium: 130 mmol/L — ABNORMAL LOW (ref 135–145)

## 2021-12-27 LAB — CBC
HCT: 35.4 % — ABNORMAL LOW (ref 36.0–46.0)
Hemoglobin: 10.9 g/dL — ABNORMAL LOW (ref 12.0–15.0)
MCH: 26.6 pg (ref 26.0–34.0)
MCHC: 30.8 g/dL (ref 30.0–36.0)
MCV: 86.3 fL (ref 80.0–100.0)
Platelets: 262 10*3/uL (ref 150–400)
RBC: 4.1 MIL/uL (ref 3.87–5.11)
RDW: 14.6 % (ref 11.5–15.5)
WBC: 7.5 10*3/uL (ref 4.0–10.5)
nRBC: 0 % (ref 0.0–0.2)

## 2021-12-27 MED ORDER — PNEUMOCOCCAL 20-VAL CONJ VACC 0.5 ML IM SUSY
0.5000 mL | PREFILLED_SYRINGE | Freq: Once | INTRAMUSCULAR | Status: AC
Start: 2021-12-27 — End: 2021-12-27
  Administered 2021-12-27: 0.5 mL via INTRAMUSCULAR
  Filled 2021-12-27 (×2): qty 0.5

## 2021-12-27 MED ORDER — SPIRONOLACTONE 12.5 MG HALF TABLET
12.5000 mg | ORAL_TABLET | Freq: Every day | ORAL | Status: DC
  Administered 2021-12-27 – 2021-12-28 (×2): 12.5 mg via ORAL
  Filled 2021-12-27 (×2): qty 1

## 2021-12-27 NOTE — Care Management Important Message (Signed)
Important Message  Patient Details  Name: Kristina Martin MRN: [id number] Date of Birth: 07-15-1946   Medicare Important Message Given:  Yes     Shelda Altes 12/27/2021, 8:40 AM

## 2021-12-27 NOTE — Progress Notes (Signed)
PROGRESS NOTE    Kristina Martin  IDP:[id number] DOB: 1946/08/15 DOA: 12/21/2021 PCP: Leonard Downing, MD  Brief Narrative: 75/F with history of longstanding tobacco use, COPD, hypertension was recently diagnosed with A. fib a week ago and started on Eliquis, she presents to the ED with progressive shortness of breath for a few weeks associated with leg swelling, orthopnea In the ED BNP was 333, chest x-ray noted cardiomegaly and interstitial edema, EKG with A. fib, heart rate in the 140s, she was started on Cardizem drip, Lasix  Subjective: -Feels better, breathing improving, almost back to baseline, was placed back on O2 last night  Assessment & Plan:  * Acute respiratory failure with hypoxia ([place])- (present on admission) Acute diastolic CHF Moderate to severe MR -Volume status improving, she is -5.3 L -2D echocardiogram noted EF of 55%, moderate PAH, moderate to severe MR and moderate TR -Diuresing well with IV Lasix, she is 6.4 L negative -Cardiology following, could switch to p.o., add low-dose Aldactone -Wean off O2, check ambulatory O2 sats -Discharge planning  Paroxysmal atrial fibrillation with RVR ([place])- (present on admission) -A. fib recently diagnosed at PCP office on Friday 12/17/21, started on Eliquis then  -Started on Cardizem drip in the ED, then transitioned to metoprolol  -Cardiology following, echo with preserved EF -Metoprolol dose increased -Plan for outpatient DC cardioversion after 3 weeks of anticoagulation  Tobacco abuse- (present on admission) Suspected COPD  -nicotine patch and encouraged cessation.  -recommend outpatient PFTs -xoponex prn   Anemia- (present on admission) Iron deficiency anemia  -Long history of anemia, history of fibroids, denies any active bleeding  -Never had a colonoscopy  -Discussed with patient and daughter, will refer to gastroenterology at discharge -Completed IV iron  Hypokalemia- (present on admission) Repleted,  monitor  Hypertension- (present on admission) Stable, continue diuretics, metoprolol as noted above  DVT prophylaxis: Eliquis Code Status: Full code Family Communication: Daughter at bedside yesterday Disposition Plan: Home tomorrow if stable   Consultants:  Cardiology  Procedures:   Antimicrobials:    Objective: Vitals:   12/26/21 1600 12/26/21 2019 12/27/21 0000 12/27/21 0424  BP: 112/83 117/72  118/67  Pulse:      Resp: (!) 23 19  19   Temp:  97.6 F (36.4 C)  98.1 F (36.7 C)  TempSrc:  Oral  Oral  SpO2:  94%  98%  Weight:   55.7 kg   Height:        Intake/Output Summary (Last 24 hours) at 12/27/2021 1145 Last data filed at 12/27/2021 1100 Gross per 24 hour  Intake 720 ml  Output 2400 ml  Net -1680 ml   Filed Weights   12/25/21 0404 12/26/21 0100 12/27/21 0000  Weight: 58.2 kg 57.3 kg 55.7 kg    Examination:  General exam: Pleasant elderly female sitting up in bed, chronically ill-appearing, AAOx3, no distress HEENT: No JVD CVS: S1-S2, irregularly irregular rhythm Lungs: Few basilar rales Abdomen: Soft, nontender, bowel sounds present Extremities: Trace edema  Skin: No rashes Psychiatry:  Mood & affect appropriate.     Data Reviewed:   CBC: Recent Labs  Lab 12/21/21 1200 12/22/21 1419 12/23/21 0221 12/24/21 0143 12/25/21 0321 12/26/21 0322 12/27/21 0226  WBC 6.4   < > 7.2 7.3 7.3 8.0 7.5  NEUTROABS 4.3  --   --   --   --   --   --   HGB 10.6*   < > 9.5* 9.4* 10.6* 11.0* 10.9*  HCT 34.4*   < >  32.2* 31.7* 34.4* 35.8* 35.4*  MCV 86.2   < > 88.2 86.6 87.3 85.9 86.3  PLT 305   < > 266 251 260 295 262   < > = values in this interval not displayed.   Basic Metabolic Panel: Recent Labs  Lab 12/21/21 1200 12/22/21 0841 12/23/21 0221 12/24/21 0143 12/25/21 0321 12/26/21 0322 12/27/21 0226  NA 136   < > 134* 135 134* 134* 130*  K 3.0*   < > 3.7 4.0 4.3 4.4 4.4  CL 96*   < > 94* 92* 90* 91* 90*  CO2 31   < > 32 35* 36* 35* 31  GLUCOSE  141*   < > 113* 106* 102* 111* 95  BUN 14   < > 10 16 14 11 12   CREATININE 0.68   < > 0.64 0.80 0.81 0.81 0.80  CALCIUM 8.7*   < > 8.7* 8.4* 8.6* 8.9 8.7*  MG 1.7  --   --  1.7  --  2.4  --    < > = values in this interval not displayed.   GFR: Estimated Creatinine Clearance: 51.7 mL/min (by C-G formula based on SCr of 0.8 mg/dL). Liver Function Tests: Recent Labs  Lab 12/21/21 1200 12/22/21 1419  AST 19 17  ALT 10 10  ALKPHOS 84 81  BILITOT 1.0 1.0  PROT 6.8 6.8  ALBUMIN 3.6 3.4*   No results for input(s): LIPASE, AMYLASE in the last 168 hours. No results for input(s): AMMONIA in the last 168 hours. Coagulation Profile: Recent Labs  Lab 12/22/21 1419  INR 1.4*   Cardiac Enzymes: No results for input(s): CKTOTAL, CKMB, CKMBINDEX, TROPONINI in the last 168 hours. BNP (last 3 results) No results for input(s): PROBNP in the last 8760 hours. HbA1C: No results for input(s): HGBA1C in the last 72 hours.  CBG: No results for input(s): GLUCAP in the last 168 hours. Lipid Profile: No results for input(s): CHOL, HDL, LDLCALC, TRIG, CHOLHDL, LDLDIRECT in the last 72 hours. Thyroid Function Tests: No results for input(s): TSH, T4TOTAL, FREET4, T3FREE, THYROIDAB in the last 72 hours.  Anemia Panel: No results for input(s): VITAMINB12, FOLATE, FERRITIN, TIBC, IRON, RETICCTPCT in the last 72 hours.  Urine analysis:    Component Value Date/Time   COLORURINE YELLOW 05/15/2013 1155   APPEARANCEUR CLEAR 05/15/2013 1155   LABSPEC 1.010 05/15/2013 1155   PHURINE 7.0 05/15/2013 1155   GLUCOSEU NEG 05/15/2013 1155   HGBUR NEG 05/15/2013 1155   BILIRUBINUR NEG 05/15/2013 1155   KETONESUR NEG 05/15/2013 1155   PROTEINUR NEG 05/15/2013 1155   UROBILINOGEN 0.2 05/15/2013 1155   NITRITE NEG 05/15/2013 1155   LEUKOCYTESUR MOD (A) 05/15/2013 1155   Sepsis Labs: @LABRCNTIP (procalcitonin:4,lacticidven:4)  ) Recent Results (from the past 240 hour(s))  Resp Panel by RT-PCR (Flu A&B,  Covid) Nasopharyngeal Swab     Status: None   Collection Time: 12/21/21  1:16 PM   Specimen: Nasopharyngeal Swab; Nasopharyngeal(NP) swabs in vial transport medium  Result Value Ref Range Status   SARS Coronavirus 2 by RT PCR NEGATIVE NEGATIVE Final    Comment: (NOTE) SARS-CoV-2 target nucleic acids are NOT DETECTED.  The SARS-CoV-2 RNA is generally detectable in upper respiratory specimens during the acute phase of infection. The lowest concentration of SARS-CoV-2 viral copies this assay can detect is 138 copies/mL. A negative result does not preclude SARS-Cov-2 infection and should not be used as the sole basis for treatment or other patient management decisions. A negative result may  occur with  improper specimen collection/handling, submission of specimen other than nasopharyngeal swab, presence of viral mutation(s) within the areas targeted by this assay, and inadequate number of viral copies(<138 copies/mL). A negative result must be combined with clinical observations, patient history, and epidemiological information. The expected result is Negative.  Fact Sheet for Patients:  EntrepreneurPulse.com.au  Fact Sheet for Healthcare Providers:  IncredibleEmployment.be  This test is no t yet approved or cleared by the [place] FDA and  has been authorized for detection and/or diagnosis of SARS-CoV-2 by FDA under an Emergency Use Authorization (EUA). This EUA will remain  in effect (meaning this test can be used) for the duration of the COVID-19 declaration under Section 564(b)(1) of the Act, 21 U.S.C.section 360bbb-3(b)(1), unless the authorization is terminated  or revoked sooner.       Influenza A by PCR NEGATIVE NEGATIVE Final   Influenza B by PCR NEGATIVE NEGATIVE Final    Comment: (NOTE) The Xpert Xpress SARS-CoV-2/FLU/RSV plus assay is intended as an aid in the diagnosis of influenza from Nasopharyngeal swab specimens and should  not be used as a sole basis for treatment. Nasal washings and aspirates are unacceptable for Xpert Xpress SARS-CoV-2/FLU/RSV testing.  Fact Sheet for Patients: EntrepreneurPulse.com.au  Fact Sheet for Healthcare Providers: IncredibleEmployment.be  This test is not yet approved or cleared by the [place] FDA and has been authorized for detection and/or diagnosis of SARS-CoV-2 by FDA under an Emergency Use Authorization (EUA). This EUA will remain in effect (meaning this test can be used) for the duration of the COVID-19 declaration under Section 564(b)(1) of the Act, 21 U.S.C. section 360bbb-3(b)(1), unless the authorization is terminated or revoked.  Performed at [facility], [address] [place], [place] [address]      Radiology Studies: DG Chest 2 View  Result Date: 12/27/2021 CLINICAL DATA:  Shortness of breath. EXAM: CHEST - 2 VIEW COMPARISON:  Chest x-ray dated December 21, 2021. FINDINGS: Unchanged mild cardiomegaly. The lungs are hyperinflated. Resolved interstitial thickening at the lung bases. New trace left pleural effusion. No consolidation or pneumothorax. No acute osseous abnormality. IMPRESSION: 1. New trace left pleural effusion. 2. Resolved pulmonary edema. Electronically Signed   By: Titus Dubin M.D.   On: 12/27/2021 10:52     Scheduled Meds:  apixaban  5 mg Oral BID   furosemide  40 mg Intravenous BID   melatonin  3 mg Oral QHS   metoprolol tartrate  50 mg Oral BID   nicotine  7 mg Transdermal Daily   pantoprazole  40 mg Oral Q1200   sodium chloride flush  3 mL Intravenous Q12H   spironolactone  12.5 mg Oral Daily   Continuous Infusions:  sodium chloride       LOS: 5 days    Time spent: 63min  Domenic Polite, MD [facility]   12/27/2021, 11:45 AM

## 2021-12-27 NOTE — Plan of Care (Signed)
?  Problem: Education: ?Goal: Ability to demonstrate management of disease process will improve ?Outcome: Progressing ?  ?Problem: Activity: ?Goal: Capacity to carry out activities will improve ?Outcome: Progressing ?  ?Problem: Cardiac: ?Goal: Ability to achieve and maintain adequate cardiopulmonary perfusion will improve ?Outcome: Progressing ?  ?

## 2021-12-27 NOTE — Progress Notes (Signed)
Progress Note  Patient Name: Kristina Martin Date of Encounter: 12/27/2021  [facility] Cardiologist: Mertie Moores, MD    Subjective   76 year old female with a history of hypertension, cigarette smoking, recently diagnosed atrial fibrillation who presented to the hospital with cough and lower extremity edema.  She was found have acute on chronic diastolic congestive heart failure along with atrial fibrillation with a rapid ventricular response.  She is also been found to have mod - severe mitral regurgitation.  I met her on Feb. 1 in consultation She has diuresed 6.5 liters so far this admission,  creatinine has been stable at 0.8.  She is breathing better.    Inpatient Medications    Scheduled Meds:  apixaban  5 mg Oral BID   furosemide  40 mg Intravenous BID   melatonin  3 mg Oral QHS   metoprolol tartrate  50 mg Oral BID   nicotine  7 mg Transdermal Daily   pantoprazole  40 mg Oral Q1200   pneumococcal 20-valent conjugate vaccine  0.5 mL Intramuscular Once   sodium chloride flush  3 mL Intravenous Q12H   Continuous Infusions:  sodium chloride     PRN Meds: sodium chloride, acetaminophen, albuterol, alum & mag hydroxide-simeth, sodium chloride flush   Vital Signs    Vitals:   12/26/21 1600 12/26/21 2019 12/27/21 0000 12/27/21 0424  BP: 112/83 117/72  118/67  Pulse:      Resp: (!) 23 19  19   Temp:  97.6 F (36.4 C)  98.1 F (36.7 C)  TempSrc:  Oral  Oral  SpO2:  94%  98%  Weight:   55.7 kg   Height:        Intake/Output Summary (Last 24 hours) at 12/27/2021 0904 Last data filed at 12/27/2021 0850 Gross per 24 hour  Intake 843 ml  Output 2100 ml  Net -1257 ml   Last 3 Weights 12/27/2021 12/26/2021 12/25/2021  Weight (lbs) 122 lb 14.4 oz 126 lb 6.4 oz 128 lb 4.8 oz  Weight (kg) 55.747 kg 57.335 kg 58.196 kg      Telemetry    Atrial fib , rate is well controlled - Personally Reviewed  ECG     - Personally Reviewed  Physical Exam   GEN: chronically ill  appearing female,    Neck: No JVD Cardiac: Irreg. Irreg.  + systolic murmur  Respiratory: greatly reduced breath sounds on left lung field.  Mildly reduced breath sounds R lung field GI: Soft, nontender, non-distended  MS: No edema; No deformity. Neuro:  Nonfocal  Psych: Normal affect   Labs    High Sensitivity Troponin:   Recent Labs  Lab 12/23/21 2123 12/23/21 2228  TROPONINIHS 12 13     Chemistry Recent Labs  Lab 12/21/21 1200 12/22/21 0841 12/22/21 1419 12/23/21 0221 12/24/21 0143 12/25/21 0321 12/26/21 0322 12/27/21 0226  NA 136   < >  --    < > 135 134* 134* 130*  K 3.0*   < >  --    < > 4.0 4.3 4.4 4.4  CL 96*   < >  --    < > 92* 90* 91* 90*  CO2 31   < >  --    < > 35* 36* 35* 31  GLUCOSE 141*   < >  --    < > 106* 102* 111* 95  BUN 14   < >  --    < > 16 14 11  12  CREATININE 0.68   < >  --    < > 0.80 0.81 0.81 0.80  CALCIUM 8.7*   < >  --    < > 8.4* 8.6* 8.9 8.7*  MG 1.7  --   --   --  1.7  --  2.4  --   PROT 6.8  --  6.8  --   --   --   --   --   ALBUMIN 3.6  --  3.4*  --   --   --   --   --   AST 19  --  17  --   --   --   --   --   ALT 10  --  10  --   --   --   --   --   ALKPHOS 84  --  81  --   --   --   --   --   BILITOT 1.0  --  1.0  --   --   --   --   --   GFRNONAA >60   < >  --    < > >60 >60 >60 >60  ANIONGAP 9   < >  --    < > 8 8 8 9    < > = values in this interval not displayed.    Lipids No results for input(s): CHOL, TRIG, HDL, LABVLDL, LDLCALC, CHOLHDL in the last 168 hours.  Hematology Recent Labs  Lab 12/25/21 0321 12/26/21 0322 12/27/21 0226  WBC 7.3 8.0 7.5  RBC 3.94 4.17 4.10  HGB 10.6* 11.0* 10.9*  HCT 34.4* 35.8* 35.4*  MCV 87.3 85.9 86.3  MCH 26.9 26.4 26.6  MCHC 30.8 30.7 30.8  RDW 14.6 14.6 14.6  PLT 260 295 262   Thyroid  Recent Labs  Lab 12/22/21 1419  TSH 0.449    BNP Recent Labs  Lab 12/21/21 1200  BNP 333.8*    DDimer No results for input(s): DDIMER in the last 168 hours.   Radiology    No  results found.  Cardiac Studies      Patient Profile     76 y.o. female    Assessment & Plan     Atrial fib:   rate is well controlled currently .  Anticipate 3 weeks of anticoagulation with cardioversion    2. Respiratory failure:   still has markedly reduced breath sound, especially on the Left. Will get a 2 view CXR. I suspect she has significant COPD .  3.  HTN:   BP is well controlled At present   4.  Acute on chronic diastolic CHF:  normal LV EF by echo on Feb. 2 Has mod . - severe MR, moderate TR  Cont diuresis.  Should be able to change the IV lasix to po lasix soon/    For questions or updates, please contact [place] Please consult [email or web address] for contact info under        Signed, Mertie Moores, MD  12/27/2021, 9:04 AM

## 2021-12-27 NOTE — Progress Notes (Signed)
Mobility Specialist Progress Note   12/27/21 1500  Mobility  Activity Transferred to/from BSC (x10 Sit to Stand)  Level of Assistance Contact guard assist, steadying assist  Assistive Device None (HHA)  Distance Ambulated (ft) 2 ft  Activity Response Tolerated well  $Mobility charge 1 Mobility   Received pt in chair having no complaints and agreeable. Requesting to use BSC prior to mobility. Able to stand and pivot Ind w/o fault. X10  STS w/ contact guard for safety. Returned BTB w/ call bell in reach and needs met.  Pre Mobility: 77 HR During Mobility: 85 HR Post Mobility: 83 HR, BP, SpO2  [place]land Falling Mobility Specialist Phone Number [phone]

## 2021-12-28 ENCOUNTER — Encounter (HOSPITAL_COMMUNITY): Payer: Self-pay | Admitting: Internal Medicine

## 2021-12-28 ENCOUNTER — Other Ambulatory Visit (HOSPITAL_COMMUNITY): Payer: Self-pay

## 2021-12-28 DIAGNOSIS — I5033 Acute on chronic diastolic (congestive) heart failure: Secondary | ICD-10-CM

## 2021-12-28 LAB — BASIC METABOLIC PANEL
Anion gap: 8 (ref 5–15)
BUN: 14 mg/dL (ref 8–23)
CO2: 38 mmol/L — ABNORMAL HIGH (ref 22–32)
Calcium: 8.6 mg/dL — ABNORMAL LOW (ref 8.9–10.3)
Chloride: 87 mmol/L — ABNORMAL LOW (ref 98–111)
Creatinine, Ser: 0.74 mg/dL (ref 0.44–1.00)
GFR, Estimated: >60 mL/min (ref >60)
Glucose, Bld: 92 mg/dL (ref 70–99)
Potassium: 4 mmol/L (ref 3.5–5.1)
Sodium: 133 mmol/L — ABNORMAL LOW (ref 135–145)

## 2021-12-28 LAB — CBC
HCT: 32.3 % — ABNORMAL LOW (ref 36.0–46.0)
Hemoglobin: 10.2 g/dL — ABNORMAL LOW (ref 12.0–15.0)
MCH: 27.3 pg (ref 26.0–34.0)
MCHC: 31.6 g/dL (ref 30.0–36.0)
MCV: 86.4 fL (ref 80.0–100.0)
Platelets: 275 10*3/uL (ref 150–400)
RBC: 3.74 MIL/uL — ABNORMAL LOW (ref 3.87–5.11)
RDW: 15 % (ref 11.5–15.5)
WBC: 9.4 10*3/uL (ref 4.0–10.5)
nRBC: 0 % (ref 0.0–0.2)

## 2021-12-28 MED ORDER — FUROSEMIDE 10 MG/ML IJ SOLN: 40.0000 mg | Freq: Two times a day (BID) | INTRAMUSCULAR | Status: DC

## 2021-12-28 MED ORDER — SPIRONOLACTONE 25 MG PO TABS
12.5000 mg | ORAL_TABLET | Freq: Every day | ORAL | 0 refills | Status: DC
  Filled 2021-12-28: qty 30, 60d supply, fill #0

## 2021-12-28 MED ORDER — POTASSIUM CHLORIDE CRYS ER 20 MEQ PO TBCR
20.0000 meq | EXTENDED_RELEASE_TABLET | Freq: Every day | ORAL | Status: DC
  Administered 2021-12-28: 20 meq via ORAL
  Filled 2021-12-28: qty 1

## 2021-12-28 MED ORDER — FUROSEMIDE 40 MG PO TABS: 40.0000 mg | ORAL_TABLET | Freq: Two times a day (BID) | ORAL | Status: DC

## 2021-12-28 MED ORDER — METOPROLOL TARTRATE 50 MG PO TABS
50.0000 mg | ORAL_TABLET | Freq: Two times a day (BID) | ORAL | 0 refills | Status: DC
  Filled 2021-12-28: qty 60, 30d supply, fill #0

## 2021-12-28 MED ORDER — FUROSEMIDE 40 MG PO TABS
40.0000 mg | ORAL_TABLET | Freq: Every day | ORAL | 0 refills | Status: DC
  Filled 2021-12-28: qty 30, 30d supply, fill #0

## 2021-12-28 MED ORDER — POTASSIUM CHLORIDE CRYS ER 20 MEQ PO TBCR
20.0000 meq | EXTENDED_RELEASE_TABLET | Freq: Every day | ORAL | 0 refills | Status: DC
  Filled 2021-12-28: qty 30, 30d supply, fill #0

## 2021-12-28 MED ORDER — DAPAGLIFLOZIN PROPANEDIOL 10 MG PO TABS
10.0000 mg | ORAL_TABLET | Freq: Every day | ORAL | 0 refills | Status: DC
  Filled 2021-12-28: qty 30, 30d supply, fill #0

## 2021-12-28 MED ORDER — DAPAGLIFLOZIN PROPANEDIOL 10 MG PO TABS
10.0000 mg | ORAL_TABLET | Freq: Every day | ORAL | Status: DC
  Administered 2021-12-28: 10 mg via ORAL
  Filled 2021-12-28: qty 1

## 2021-12-28 NOTE — Discharge Summary (Addendum)
Physician Discharge Summary  Kristina Martin:[id number] DOB: 1946-03-31 DOA: 12/21/2021  PCP: Leonard Downing, MD  Admit date: 12/21/2021 Discharge date: 12/28/2021  Time spent: 35 minutes  Recommendations for Outpatient Follow-up:  Cardiology Dr. Acie Fredrickson in 1 to 2 weeks PCP in 1 week, please check BMP at follow-up Follow-up with gastroenterology for iron deficiency anemia needs screening colonoscopy   Discharge Diagnoses:  Principal Problem:   Acute respiratory failure with hypoxia (HCC) Acute diastolic CHF   Paroxysmal atrial fibrillation with RVR (HCC) Iron deficiency anemia   Acute exacerbation of CHF (congestive heart failure) (HCC)   Hypertension   Hypokalemia   Anemia   Tobacco abuse   Acute on chronic diastolic CHF (congestive heart failure) ([place]) COPD  Discharge Condition: Stable  Diet recommendation: Low-sodium, heart healthy  Filed Weights   12/26/21 0100 12/27/21 0000 12/28/21 2633  Weight: 57.3 kg 55.7 kg 54.7 kg    History of present illness:  76/F with history of longstanding tobacco use, COPD, hypertension was recently diagnosed with A. fib a week ago and started on Eliquis, she presents to the ED with progressive shortness of breath for a few weeks associated with leg swelling, orthopnea In the ED BNP was 333, chest x-ray noted cardiomegaly and interstitial edema, EKG with A. fib, heart rate in the 140s, she was started on Cardizem drip, Lasix   Hospital Course:   * Acute respiratory failure with hypoxia ([place])- (present on admission) Acute diastolic CHF Moderate to severe MR -Volume status improved, diuresed with IV Lasix, she is - 7 L -2D echocardiogram noted EF of 55%, moderate PAH, moderate to severe MR and moderate TR -Transition to Lasix p.o. 40 Mg daily, Aldactone and Farxiga -Weaned off O2 at discharge -Follow-up with Dr. Acie Fredrickson in 1 to 2 weeks  Paroxysmal atrial fibrillation with RVR ([place])- (present on admission) -A. fib recently  diagnosed at PCP office on Friday 12/17/21, started on Eliquis then  -Started on Cardizem drip in the ED, then transitioned to metoprolol  -Cardiology following, echo with preserved EF -Metoprolol dose increased, heart rate controlled -Plan for outpatient DC cardioversion after 3 weeks of anticoagulation  Tobacco abuse- (present on admission) Suspected COPD  -nicotine patch and encouraged cessation.  -recommend outpatient PFTs -xoponex prn   Anemia- (present on admission) Iron deficiency anemia  -Long history of anemia, history of fibroids, denies any active bleeding  -Never had a colonoscopy  -Discussed with patient and daughter, will refer to gastroenterology at discharge -Completed IV iron  Hypokalemia- (present on admission) Repleted, monitor  Hypertension- (present on admission) Stable, continue diuretics, metoprolol as noted above      Consultants: Cardiology  Discharge Exam: Vitals:   12/28/21 0611 12/28/21 1127  BP: 127/71 102/68  Pulse:  60  Resp: 20 20  Temp: 98.1 F (36.7 C) 98.3 F (36.8 C)  SpO2: 100%    General exam: Pleasant elderly female sitting up in bed, chronically ill-appearing, AAOx3, no distress HEENT: No JVD CVS: S1-S2, irregularly irregular rhythm Lungs: Few basilar rales Abdomen: Soft, nontender, bowel sounds present Extremities: Trace edema  Skin: No rashes Psychiatry:  Mood & affect appropriate.      Discharge Instructions   Discharge Instructions     Amb referral to AFIB Clinic   Complete by: As directed    Diet - low sodium heart healthy   Complete by: As directed    Increase activity slowly   Complete by: As directed       Allergies as of  12/28/2021   No Known Allergies      Medication List     STOP taking these medications    lisinopril-hydrochlorothiazide 20-25 MG tablet Commonly known as: ZESTORETIC       TAKE these medications    acetaminophen 325 MG tablet Commonly known as: TYLENOL Take 650-975 mg  by mouth every 4 (four) hours as needed (pain).   albuterol 108 (90 Base) MCG/ACT inhaler Commonly known as: VENTOLIN HFA Inhale 2 puffs into the lungs every 6 (six) hours as needed for shortness of breath.   CALCIUM + D PO Take 1 tablet by mouth daily.   Eliquis 5 MG Tabs tablet Generic drug: apixaban Take 5 mg by mouth in the morning and at bedtime.   Farxiga 10 MG Tabs tablet Generic drug: dapagliflozin propanediol Take 1 tablet (10 mg total) by mouth daily. Start taking on: December 29, 2021   furosemide 40 MG tablet Commonly known as: Lasix Take 1 tablet (40 mg total) by mouth daily.   metoprolol tartrate 50 MG tablet Commonly known as: LOPRESSOR Take 1 tablet (50 mg total) by mouth 2 (two) times daily.   potassium chloride SA 20 MEQ tablet Commonly known as: KLOR-CON M Take 1 tablet (20 mEq total) by mouth daily. Start taking on: December 29, 2021   spironolactone 25 MG tablet Commonly known as: ALDACTONE Take 1/2 tablet (12.5 mg total) by mouth daily. Start taking on: December 29, 2021   VITAMIN D PO Take 1 tablet by mouth daily.       No Known Allergies  Follow-up Information     [facility] Follow up.   Specialty: Cardiology Why: 01/05/2022 at [facility] information: [address] [id number] mc [place] [address] [phone]        Leonard Downing, MD Follow up.   Specialty: Family Medicine Why: 01/04/22 at 10:30am Contact information: [place] [address] [phone]                  The results of significant diagnostics from this hospitalization (including imaging, microbiology, ancillary and laboratory) are listed below for reference.    Significant Diagnostic Studies: DG Chest 2 View  Result Date: 12/27/2021 CLINICAL DATA:  Shortness of breath. EXAM: CHEST - 2 VIEW COMPARISON:  Chest x-ray dated December 21, 2021. FINDINGS: Unchanged mild cardiomegaly.  The lungs are hyperinflated. Resolved interstitial thickening at the lung bases. New trace left pleural effusion. No consolidation or pneumothorax. No acute osseous abnormality. IMPRESSION: 1. New trace left pleural effusion. 2. Resolved pulmonary edema. Electronically Signed   By: Titus Dubin M.D.   On: 12/27/2021 10:52   DG Chest Portable 1 View  Result Date: 12/21/2021 CLINICAL DATA:  Dry cough and shortness of breath. EXAM: PORTABLE CHEST 1 VIEW COMPARISON:  None. FINDINGS: Mild cardiomegaly. Mild interstitial thickening at the right greater than left lung bases. No consolidation, pneumothorax, or large pleural effusion. No acute osseous abnormality. IMPRESSION: 1. Mild cardiomegaly with mild interstitial edema. Electronically Signed   By: Titus Dubin M.D.   On: 12/21/2021 13:05   ECHOCARDIOGRAM COMPLETE  Result Date: 12/23/2021    ECHOCARDIOGRAM REPORT   Patient Name:   ANALEIGH ARIES Date of Exam: 12/23/2021 Medical Rec #:  [id number]     Height:       64.0 in Accession #:    [id number]    Weight:       138.0 lb Date of Birth:  1946/06/11  BSA:          1.671 m Patient Age:    9 years      BP:           118/68 mmHg Patient Gender: F             HR:           109 bpm. Exam Location:  Inpatient Procedure: 2D Echo Indications:    acute systolic chf  History:        Patient has no prior history of Echocardiogram examinations.                 Arrythmias:Atrial Fibrillation; Risk Factors:Hypertension and                 Current Smoker.  Sonographer:    Johny Chess RDCS Referring Phys: [id number] [place]  1. Left ventricular ejection fraction, by estimation, is 55 to 60%. The left ventricle has normal function. The left ventricle has no regional wall motion abnormalities. There is mild concentric left ventricular hypertrophy. Left ventricular diastolic parameters are indeterminate.  2. Right ventricular systolic function is normal. The right ventricular size is normal. There is  moderately elevated pulmonary artery systolic pressure.  3. Left atrial size was mildly dilated.  4. Right atrial size was moderately dilated.  5. There are multple jets of MR, central and posteriorly directed. . Moderate to severe mitral valve regurgitation.  6. Tricuspid valve regurgitation is moderate.  7. The aortic valve is tricuspid. Aortic valve regurgitation is not visualized. Aortic valve sclerosis/calcification is present, without any evidence of aortic stenosis.  8. The inferior vena cava is dilated in size with <50% respiratory variability, suggesting right atrial pressure of 15 mmHg. FINDINGS  Left Ventricle: Left ventricular ejection fraction, by estimation, is 55 to 60%. The left ventricle has normal function. The left ventricle has no regional wall motion abnormalities. The left ventricular internal cavity size was normal in size. There is  mild concentric left ventricular hypertrophy. Left ventricular diastolic parameters are indeterminate. Right Ventricle: The right ventricular size is normal. Right vetricular wall thickness was not assessed. Right ventricular systolic function is normal. There is moderately elevated pulmonary artery systolic pressure. The tricuspid regurgitant velocity is  3.75 m/s, and with an assumed right atrial pressure of 3 mmHg, the estimated right ventricular systolic pressure is 06.2 mmHg. Left Atrium: Left atrial size was mildly dilated. Right Atrium: Right atrial size was moderately dilated. Pericardium: There is no evidence of pericardial effusion. Mitral Valve: There are multple jets of MR, central and posteriorly directed. There is mild thickening of the mitral valve leaflet(s). There is mild calcification of the mitral valve leaflet(s). Mild to moderate mitral annular calcification. Moderate to severe mitral valve regurgitation. Tricuspid Valve: The tricuspid valve is normal in structure. Tricuspid valve regurgitation is moderate. Aortic Valve: The aortic valve is  tricuspid. Aortic valve regurgitation is not visualized. Aortic valve sclerosis/calcification is present, without any evidence of aortic stenosis. Pulmonic Valve: The pulmonic valve was not well visualized. Pulmonic valve regurgitation is not visualized. Aorta: The aortic root and ascending aorta are structurally normal, with no evidence of dilitation. Venous: The inferior vena cava is dilated in size with less than 50% respiratory variability, suggesting right atrial pressure of 15 mmHg. IAS/Shunts: No atrial level shunt detected by color flow Doppler.  LEFT VENTRICLE PLAX 2D LVIDd:         3.50 cm LVIDs:  2.50 cm LV PW:         1.30 cm LV IVS:        1.20 cm LVOT diam:     1.70 cm LV SV:         32 LV SV Index:   19 LVOT Area:     2.27 cm  RIGHT VENTRICLE          IVC RV Basal diam:  2.70 cm  IVC diam: 2.10 cm LEFT ATRIUM             Index        RIGHT ATRIUM           Index LA diam:        3.60 cm 2.15 cm/m   RA Area:     20.50 cm LA Vol (A2C):   56.0 ml 33.51 ml/m  RA Volume:   61.00 ml  36.51 ml/m LA Vol (A4C):   50.6 ml 30.28 ml/m LA Biplane Vol: 55.4 ml 33.15 ml/m  AORTIC VALVE LVOT Vmax:   93.60 cm/s LVOT Vmean:  60.800 cm/s LVOT VTI:    0.143 m  AORTA Ao Root diam: 3.10 cm Ao Asc diam:  2.80 cm MR Peak grad:    118.2 mmHg   TRICUSPID VALVE MR Mean grad:    83.7 mmHg    TR Peak grad:   56.2 mmHg MR Vmax:         543.67 cm/s  TR Vmax:        375.00 cm/s MR Vmean:        437.0 cm/s MR PISA:         0.25 cm     SHUNTS MR PISA Eff ROA: 2 mm        Systemic VTI:  0.14 m MR PISA Radius:  0.20 cm      Systemic Diam: 1.70 cm Dorris Carnes MD Electronically signed by Dorris Carnes MD Signature Date/Time: 12/23/2021/2:04:14 PM    Final     Microbiology: Recent Results (from the past 240 hour(s))  Resp Panel by RT-PCR (Flu A&B, Covid) Nasopharyngeal Swab     Status: None   Collection Time: 12/21/21  1:16 PM   Specimen: Nasopharyngeal Swab; Nasopharyngeal(NP) swabs in vial transport medium  Result Value  Ref Range Status   SARS Coronavirus 2 by RT PCR NEGATIVE NEGATIVE Final    Comment: (NOTE) SARS-CoV-2 target nucleic acids are NOT DETECTED.  The SARS-CoV-2 RNA is generally detectable in upper respiratory specimens during the acute phase of infection. The lowest concentration of SARS-CoV-2 viral copies this assay can detect is 138 copies/mL. A negative result does not preclude SARS-Cov-2 infection and should not be used as the sole basis for treatment or other patient management decisions. A negative result may occur with  improper specimen collection/handling, submission of specimen other than nasopharyngeal swab, presence of viral mutation(s) within the areas targeted by this assay, and inadequate number of viral copies(<138 copies/mL). A negative result must be combined with clinical observations, patient history, and epidemiological information. The expected result is Negative.  Fact Sheet for Patients:  EntrepreneurPulse.com.au  Fact Sheet for Healthcare Providers:  IncredibleEmployment.be  This test is no t yet approved or cleared by the [place] FDA and  has been authorized for detection and/or diagnosis of SARS-CoV-2 by FDA under an Emergency Use Authorization (EUA). This EUA will remain  in effect (meaning this test can be used) for the duration of the COVID-19 declaration under Section 564(b)(1) of  the Act, 21 U.S.C.section 360bbb-3(b)(1), unless the authorization is terminated  or revoked sooner.       Influenza A by PCR NEGATIVE NEGATIVE Final   Influenza B by PCR NEGATIVE NEGATIVE Final    Comment: (NOTE) The Xpert Xpress SARS-CoV-2/FLU/RSV plus assay is intended as an aid in the diagnosis of influenza from Nasopharyngeal swab specimens and should not be used as a sole basis for treatment. Nasal washings and aspirates are unacceptable for Xpert Xpress SARS-CoV-2/FLU/RSV testing.  Fact Sheet for  Patients: EntrepreneurPulse.com.au  Fact Sheet for Healthcare Providers: IncredibleEmployment.be  This test is not yet approved or cleared by the [place] FDA and has been authorized for detection and/or diagnosis of SARS-CoV-2 by FDA under an Emergency Use Authorization (EUA). This EUA will remain in effect (meaning this test can be used) for the duration of the COVID-19 declaration under Section 564(b)(1) of the Act, 21 U.S.C. section 360bbb-3(b)(1), unless the authorization is terminated or revoked.  Performed at [facility], [address] [place], [place] [address]      Labs: Basic Metabolic Panel: Recent Labs  Lab 12/24/21 0143 12/25/21 0321 12/26/21 0322 12/27/21 0226 12/28/21 0314  NA 135 134* 134* 130* 133*  K 4.0 4.3 4.4 4.4 4.0  CL 92* 90* 91* 90* 87*  CO2 35* 36* 35* 31 38*  GLUCOSE 106* 102* 111* 95 92  BUN 16 14 11 12 14   CREATININE 0.80 0.81 0.81 0.80 0.74  CALCIUM 8.4* 8.6* 8.9 8.7* 8.6*  MG 1.7  --  2.4  --   --    Liver Function Tests: Recent Labs  Lab 12/22/21 1419  AST 17  ALT 10  ALKPHOS 81  BILITOT 1.0  PROT 6.8  ALBUMIN 3.4*   No results for input(s): LIPASE, AMYLASE in the last 168 hours. No results for input(s): AMMONIA in the last 168 hours. CBC: Recent Labs  Lab 12/24/21 0143 12/25/21 0321 12/26/21 0322 12/27/21 0226 12/28/21 0314  WBC 7.3 7.3 8.0 7.5 9.4  HGB 9.4* 10.6* 11.0* 10.9* 10.2*  HCT 31.7* 34.4* 35.8* 35.4* 32.3*  MCV 86.6 87.3 85.9 86.3 86.4  PLT 251 260 295 262 275   Cardiac Enzymes: No results for input(s): CKTOTAL, CKMB, CKMBINDEX, TROPONINI in the last 168 hours. BNP: BNP (last 3 results) Recent Labs    12/21/21 1200  BNP 333.8*    ProBNP (last 3 results) No results for input(s): PROBNP in the last 8760 hours.  CBG: No results for input(s): GLUCAP in the last 168 hours.     Signed:  Domenic Polite MD.  [facility] 12/28/2021, 2:08  PM

## 2021-12-28 NOTE — Progress Notes (Signed)
Explained discharge instructions to patient and her daughter. Informed of new meds and follow up appointments. Removed IV and d/c'd from telemetry. Monitoring made aware.

## 2021-12-28 NOTE — Progress Notes (Signed)
Heart Failure Nurse Navigator Progress Note  Assessed for HV TOC readiness. No plan for HV TOC clinic as ECHO shows LVEF WNL and no RV dysfunction. Pt was in AF RVR and significant MR. Pt resting in recliner with legs elevated on 1LPM O2 via [place]. Pt states she has been smoking since she was 76yo, currently smoking 2 cigarettes per day. Plans to continue cessation upon DC. Spent extra time completing HF education regarding medication compliance and extended item regarding diet modifications. Pt verbalizes understanding. Pt does not drive, husband/daughter provides transportation.   Pt will have cardiology follow up with Dr. Acie Fredrickson.   Pricilla Holm, MSN, RN Heart Failure Nurse Navigator [phone]

## 2021-12-28 NOTE — Plan of Care (Signed)
°  Problem: Education: Goal: Ability to demonstrate management of disease process will improve Outcome: Adequate for Discharge Goal: Ability to verbalize understanding of medication therapies will improve Outcome: Adequate for Discharge Goal: Individualized Educational Video(s) Outcome: Adequate for Discharge   Problem: Activity: Goal: Capacity to carry out activities will improve Outcome: Adequate for Discharge   Problem: Cardiac: Goal: Ability to achieve and maintain adequate cardiopulmonary perfusion will improve Outcome: Adequate for Discharge   Problem: Health Behavior/Discharge Planning: Goal: Ability to safely manage health-related needs after discharge will improve Outcome: Adequate for Discharge

## 2021-12-28 NOTE — Progress Notes (Signed)
Progress Note  Patient Name: Kristina Martin Date of Encounter: 12/28/2021  [facility] Cardiologist: Mertie Moores, MD    Subjective   76 year old female with a history of hypertension, cigarette smoking, recently diagnosed atrial fibrillation who presented to the hospital with cough and lower extremity edema.  She was found have acute on chronic diastolic congestive heart failure along with atrial fibrillation with a rapid ventricular response.  She is also been found to have mod - severe mitral regurgitation.  I met her on Feb. 1 in consultation She has diuresed a total of 7 L so far during this admission.  Creatinine has improved slightly to 0.74.  CXR yesterday revealed hyperinflation - c/w COPD .  She is breathing better.  Still has leg edema  Was eating hot dogs many times during the week prior to admission  I suspect this was a major contributor to her acute CHF    Inpatient Medications    Scheduled Meds:  apixaban  5 mg Oral BID   furosemide  40 mg Intravenous BID   melatonin  3 mg Oral QHS   metoprolol tartrate  50 mg Oral BID   nicotine  7 mg Transdermal Daily   pantoprazole  40 mg Oral Q1200   sodium chloride flush  3 mL Intravenous Q12H   spironolactone  12.5 mg Oral Daily   Continuous Infusions:  sodium chloride     PRN Meds: sodium chloride, acetaminophen, albuterol, alum & mag hydroxide-simeth, sodium chloride flush   Vital Signs    Vitals:   12/27/21 0424 12/27/21 1230 12/27/21 2010 12/28/21 0611  BP: 118/67 104/73 128/67 127/71  Pulse:  80    Resp: 19 (!) _0 Temp: 98.1 F (36.7 C) 98 F (36.7 C) 98.2 F (36.8 C) 98.1 F (36.7 C)  TempSrc: Oral Oral Oral Oral  SpO2: 98% 100% 100% 100%  Weight:    54.7 kg  Height:        Intake/Output Summary (Last 24 hours) at 12/28/2021 0840 Last data filed at 12/28/2021 0815 Gross per 24 hour  Intake 960 ml  Output 1400 ml  Net -440 ml    Last 3 Weights 12/28/2021 12/27/2021 12/26/2021  Weight  (lbs) 120 lb 11.2 oz 122 lb 14.4 oz 126 lb 6.4 oz  Weight (kg) 54.749 kg 55.747 kg 57.335 kg      Telemetry    Atrial fib , rate is well controlled - Personally Reviewed  ECG     - Personally Reviewed  Physical Exam   Physical Exam: Blood pressure 127/71, pulse 80, temperature 98.1 F (36.7 C), temperature source Oral, resp. rate 20, height 5' 4" (1.626 m), weight 54.7 kg, SpO2 100 %.  GEN:  chronically ill female,  NAD  HEENT: Normal NECK: No JVD; No carotid bruits LYMPHATICS: No lymphadenopathy CARDIAC: irreg. Irreg. ,  HR is well controlled.   No significant murmur heard on auscultation  RESPIRATORY:  reduced breath sounds bilat .  ABDOMEN: Soft, non-tender, non-distended MUSCULOSKELETAL:  arms are wrinkled and dry.  Legs still have 1-2 + edema  SKIN: Warm and dry NEUROLOGIC:  Alert and oriented x 3  Labs    High Sensitivity Troponin:   Recent Labs  Lab 12/23/21 2123 12/23/21 2228  TROPONINIHS 12 13      Chemistry Recent Labs  Lab 12/21/21 1200 12/22/21 0841 12/22/21 1419 12/23/21 0221 12/24/21 0143 12/25/21 0321 12/26/21 0322 12/27/21 0226 12/28/21 0314  NA 136   < >  --    < >  135   < > 134* 130* 133*  °K 3.0*   < >  --    < > 4.0   < > 4.4 4.4 4.0  °CL 96*   < >  --    < > 92*   < > 91* 90* 87*  °CO2 31   < >  --    < > 35*   < > 35* 31 38*  °GLUCOSE 141*   < >  --    < > 106*   < > 111* 95 92  °BUN 14   < >  --    < > 16   < > 11 12 14  °CREATININE 0.68   < >  --    < > 0.80   < > 0.81 0.80 0.74  °CALCIUM 8.7*   < >  --    < > 8.4*   < > 8.9 8.7* 8.6*  °MG 1.7  --   --   --  1.7  --  2.4  --   --   °PROT 6.8  --  6.8  --   --   --   --   --   --   °ALBUMIN 3.6  --  3.4*  --   --   --   --   --   --   °AST 19  --  17  --   --   --   --   --   --   °ALT 10  --  10  --   --   --   --   --   --   °ALKPHOS 84  --  81  --   --   --   --   --   --   °BILITOT 1.0  --  1.0  --   --   --   --   --   --   °GFRNONAA >60   < >  --    < > >60   < > >60 >60 >60  °ANIONGAP 9    < >  --    < > 8   < > 8 9 8  ° < > = values in this interval not displayed.  ° °  °Lipids No results for input(s): CHOL, TRIG, HDL, LABVLDL, LDLCALC, CHOLHDL in the last 168 hours.  °Hematology °Recent Labs  °Lab 12/26/21 °0322 12/27/21 °0226 12/28/21 °0314  °WBC 8.0 7.5 9.4  °RBC 4.17 4.10 3.74*  °HGB 11.0* 10.9* 10.2*  °HCT 35.8* 35.4* 32.3*  °MCV 85.9 86.3 86.4  °MCH 26.4 26.6 27.3  °MCHC 30.7 30.8 31.6  °RDW 14.6 14.6 15.0  °PLT 295 262 275  ° ° °Thyroid  °Recent Labs  °Lab 12/22/21 °1419  °TSH 0.449  ° °  °BNP °Recent Labs  °Lab 12/21/21 °1200  °BNP 333.8*  ° °  °DDimer No results for input(s): DDIMER in the last 168 hours.  ° °Radiology  °  °DG Chest 2 View ° °Result Date: 12/27/2021 °CLINICAL DATA:  Shortness of breath. EXAM: CHEST - 2 VIEW COMPARISON:  Chest x-ray dated December 21, 2021. FINDINGS: Unchanged mild cardiomegaly. The lungs are hyperinflated. Resolved interstitial thickening at the lung bases. New trace left pleural effusion. No consolidation or pneumothorax. No acute osseous abnormality. IMPRESSION: 1. New trace left pleural effusion. 2. Resolved pulmonary edema. Electronically Signed   By: William T Derry M.D.   On: 12/27/2021 10:52   ° °  Cardiac Studies  ° °  ° °Patient Profile  °   °75 y.o. female   ° °Assessment & Plan  °  ° Atrial fib:   Her heart rate is very well controlled.  Continue current medications.  Continue anticoagulation.  We will see her back in the office in several weeks and set up cardioversion at that time. ° ° °2. Respiratory failure:   Chest x-ray from yesterday reveals hyperinflation of both lungs.  She does not have any significant pleural effusion.  I suspect she has significant COPD. ° °3.  HTN:   Blood pressure is well controlled. ° °4.  Acute on chronic diastolic CHF:   °She was eating a very high salt diet prior to admission.  She was eating hotdogs many times a week.  We discussed the high salt content of hotdogs and other processed meats.  I suspect she needs 1  more day of IV Lasix and then we will change her to oral Lasix at that point.  I anticipate that she will be able to go home in the next day or so.  She is very close to her baseline. °She will need to ambulate out in the halls prior to discharge. ° ° ° °For questions or updates, please contact [facility] °Please consult [email or web address] for contact info under  ° °  °   °Signed, ° , MD  °12/28/2021, 8:40 AM   ° °

## 2021-12-28 NOTE — Progress Notes (Signed)
SATURATION QUALIFICATIONS: (This note is used to comply with regulatory documentation for home oxygen)  Patient Saturations on Room Air at Rest = 95%  Patient Saturations on Room Air while Ambulating = 91%  Patient Saturations on N/A Liters of oxygen while Ambulating = N/A%  Please briefly explain why patient needs home oxygen: N/A  Nelta Numbers Mobility Specialist  Phone [phone]

## 2021-12-28 NOTE — Progress Notes (Signed)
Mobility Specialist Progress Note:   12/28/21 1200  Mobility  Activity Ambulated with assistance in hallway  Level of Assistance Standby assist, set-up cues, supervision of patient - no hands on  Assistive Device Front wheel walker  Distance Ambulated (ft) 250 ft  Activity Response Tolerated well  $Mobility charge 1 Mobility    Pre Mobility: SpO2 95% RA During Mobility: SpO2 91% RA  Pt asx during ambulation. Left sitting in chair with all needs met.   Nelta Numbers Mobility Specialist  Phone [phone]

## 2021-12-28 NOTE — Progress Notes (Signed)
Heart Failure Stewardship Pharmacist Progress Note   PCP: Leonard Downing, MD PCP-Cardiologist: Mertie Moores, MD    HPI:  76 yo F with PMH of HTN, tobacco use, and newly diagnosed afib. She presented to the ED on 1/31 with shortness of breath, edema, orthopnea, and cough. CXR with mild cardiomegaly and mild interstitial edema. ECHO done on 2/2 and LVEF is 55-60% with mild LVH.   Current HF Medications: Diuretic: furosemide 40 mg IV BID Beta Blocker: metoprolol tartrate 50 mg BID Aldosterone Antagonist: spironolactone 12.5 mg daily  Prior to admission HF Medications: None  Pertinent Lab Values: Serum creatinine 0.74, BUN 14, Potassium 4.0, Sodium 133, BNP 333, Magnesium 2.4, A1c 6.4   Vital Signs: Weight: 120 lbs (admission weight: 134 lbs) Blood pressure: 120/70s  Heart rate: 70s  I/O: -1.6L yesterday; net -7.7L  Medication Assistance / Insurance Benefits Check: Does the patient have prescription insurance?  Yes Type of insurance plan: [employer]  Outpatient Pharmacy:  Prior to admission [facility] Is the patient willing to use [facility] at discharge? Yes Is the patient willing to transition their outpatient pharmacy to utilize a [facility] outpatient pharmacy?   Pending    Assessment: 1. Acute on chronic diastolic CHF (EF 60-04%), due to dietary habits. NYHA class III symptoms. - Continue furosemide 40 mg IV BID - Continue metoprolol tartrate 50 mg BID - Continue spironolactone 12.5 mg daily - Consider starting Farxiga 10 mg daily    Plan: 1) Medication changes recommended at this time: - Start Farxiga 10 mg daily  2) Patient assistance: - Farxiga copay $15  3)  Education  - To be completed prior to discharge  Kerby Nora, PharmD, BCPS Heart Failure Cytogeneticist Phone [phone]

## 2021-12-31 ENCOUNTER — Ambulatory Visit: Payer: PPO | Admitting: Internal Medicine

## 2022-01-05 ENCOUNTER — Other Ambulatory Visit: Payer: Self-pay

## 2022-01-05 ENCOUNTER — Ambulatory Visit (HOSPITAL_COMMUNITY)
Admit: 2022-01-05 | Discharge: 2022-01-05 | Disposition: A | Discharge status: Home / Self Care | Payer: PPO | Source: Ambulatory Visit | Attending: Physician Assistant | Admitting: Physician Assistant

## 2022-01-05 ENCOUNTER — Encounter (HOSPITAL_COMMUNITY): Payer: Self-pay | Admitting: Physician Assistant

## 2022-01-05 VITALS — BP 102/80 | HR 67 | Ht 64.0 in | Wt 121.8 lb

## 2022-01-05 DIAGNOSIS — D6869 Other thrombophilia: Secondary | ICD-10-CM | POA: Diagnosis not present

## 2022-01-05 DIAGNOSIS — R400 Somnolence: Secondary | ICD-10-CM | POA: Diagnosis not present

## 2022-01-05 DIAGNOSIS — I5033 Acute on chronic diastolic (congestive) heart failure: Secondary | ICD-10-CM | POA: Diagnosis not present

## 2022-01-05 DIAGNOSIS — Z7901 Long term (current) use of anticoagulants: Secondary | ICD-10-CM | POA: Diagnosis not present

## 2022-01-05 DIAGNOSIS — R0683 Snoring: Secondary | ICD-10-CM | POA: Insufficient documentation

## 2022-01-05 DIAGNOSIS — R0600 Dyspnea, unspecified: Secondary | ICD-10-CM | POA: Insufficient documentation

## 2022-01-05 DIAGNOSIS — I4819 Other persistent atrial fibrillation: Secondary | ICD-10-CM | POA: Diagnosis present

## 2022-01-05 DIAGNOSIS — F17210 Nicotine dependence, cigarettes, uncomplicated: Secondary | ICD-10-CM | POA: Diagnosis not present

## 2022-01-05 DIAGNOSIS — I38 Endocarditis, valve unspecified: Secondary | ICD-10-CM | POA: Insufficient documentation

## 2022-01-05 DIAGNOSIS — I110 Hypertensive heart disease with heart failure: Secondary | ICD-10-CM | POA: Diagnosis not present

## 2022-01-05 DIAGNOSIS — Z79899 Other long term (current) drug therapy: Secondary | ICD-10-CM | POA: Diagnosis not present

## 2022-01-05 NOTE — Patient Instructions (Signed)
Cardioversion scheduled for Tuesday, March 7th  - come to clinic for labs at [place] at the [facility] and go to admitting at [facility] not eat or drink anything after midnight the night prior to your procedure.  - Take all your morning medication (except diabetic medications) with a sip of water prior to arrival.  - You will not be able to drive home after your procedure.  - Do NOT miss any doses of your blood thinner - if you should miss a dose please notify our office immediately.  - If you feel as if you go back into normal rhythm prior to scheduled cardioversion, please notify our office immediately. If your procedure is canceled in the cardioversion suite you will be charged a cancellation fee.        DASH Eating Plan DASH stands for Dietary Approaches to Stop Hypertension. The DASH eating plan is a healthy eating plan that has been shown to: Reduce high blood pressure (hypertension). Reduce your risk for type 2 diabetes, heart disease, and stroke. Help with weight loss. What are tips for following this plan? Reading food labels Check food labels for the amount of salt (sodium) per serving. Choose foods with less than 5 percent of the Daily Value of sodium. Generally, foods with less than 300 milligrams (mg) of sodium per serving fit into this eating plan. To find whole grains, look for the word "whole" as the first word in the ingredient list. Shopping Buy products labeled as "low-sodium" or "no salt added." Buy fresh foods. Avoid canned foods and pre-made or frozen meals. Cooking Avoid adding salt when cooking. Use salt-free seasonings or herbs instead of table salt or sea salt. Check with your health care provider or pharmacist before using salt substitutes. Do not fry foods. Cook foods using healthy methods such as baking, boiling, grilling, roasting, and broiling instead. Cook with heart-healthy oils, such as olive, canola, avocado, soybean, or  sunflower oil. Meal planning  Eat a balanced diet that includes: 4 or more servings of fruits and 4 or more servings of vegetables each day. Try to fill one-half of your plate with fruits and vegetables. 6-8 servings of whole grains each day. Less than 6 oz (170 g) of lean meat, poultry, or fish each day. A 3-oz (85-g) serving of meat is about the same size as a deck of cards. One egg equals 1 oz (28 g). 2-3 servings of low-fat dairy each day. One serving is 1 cup (237 mL). 1 serving of nuts, seeds, or beans 5 times each week. 2-3 servings of heart-healthy fats. Healthy fats called omega-3 fatty acids are found in foods such as walnuts, flaxseeds, fortified milks, and eggs. These fats are also found in cold-water fish, such as sardines, salmon, and mackerel. Limit how much you eat of: Canned or prepackaged foods. Food that is high in trans fat, such as some fried foods. Food that is high in saturated fat, such as fatty meat. Desserts and other sweets, sugary drinks, and other foods with added sugar. Full-fat dairy products. Do not salt foods before eating. Do not eat more than 4 egg yolks a week. Try to eat at least 2 vegetarian meals a week. Eat more home-cooked food and less restaurant, buffet, and fast food. Lifestyle When eating at a restaurant, ask that your food be prepared with less salt or no salt, if possible. If you drink alcohol: Limit how much you use to: 0-1 drink a  day for women who are not pregnant. 0-2 drinks a day for men. Be aware of how much alcohol is in your drink. In the [place] one drink equals one 12 oz bottle of beer (355 mL), one 5 oz glass of wine (148 mL), or one 1 oz glass of hard liquor (44 mL). General information Avoid eating more than 2,300 mg of salt a day. If you have hypertension, you may need to reduce your sodium intake to 1,500 mg a day. Work with your health care provider to maintain a healthy body weight or to lose weight. Ask what an ideal weight  is for you. Get at least 30 minutes of exercise that causes your heart to beat faster (aerobic exercise) most days of the week. Activities may include walking, swimming, or biking. Work with your health care provider or dietitian to adjust your eating plan to your individual calorie needs. What foods should I eat? Fruits All fresh, dried, or frozen fruit. Canned fruit in natural juice (without added sugar). Vegetables Fresh or frozen vegetables (raw, steamed, roasted, or grilled). Low-sodium or reduced-sodium tomato and vegetable juice. Low-sodium or reduced-sodium tomato sauce and tomato paste. Low-sodium or reduced-sodium canned vegetables. Grains Whole-grain or whole-wheat bread. Whole-grain or whole-wheat pasta. Brown rice. [place]. Bulgur. Whole-grain and low-sodium cereals. Pita bread. Low-fat, low-sodium crackers. Whole-wheat flour tortillas. Meats and other proteins Skinless chicken or [place]. Ground chicken or [place]. Pork with fat trimmed off. Fish and seafood. Egg whites. Dried beans, peas, or lentils. Unsalted nuts, nut butters, and seeds. Unsalted canned beans. Lean cuts of beef with fat trimmed off. Low-sodium, lean precooked or cured meat, such as sausages or meat loaves. Dairy Low-fat (1%) or fat-free (skim) milk. Reduced-fat, low-fat, or fat-free cheeses. Nonfat, low-sodium ricotta or cottage cheese. Low-fat or nonfat yogurt. Low-fat, low-sodium cheese. Fats and oils Soft margarine without trans fats. Vegetable oil. Reduced-fat, low-fat, or light mayonnaise and salad dressings (reduced-sodium). Canola, safflower, olive, avocado, soybean, and sunflower oils. Avocado. Seasonings and condiments Herbs. Spices. Seasoning mixes without salt. Other foods Unsalted popcorn and pretzels. Fat-free sweets. The items listed above may not be a complete list of foods and beverages you can eat. Contact a dietitian for more information. What foods should I avoid? Fruits Canned fruit in  a light or heavy syrup. Fried fruit. Fruit in cream or butter sauce. Vegetables Creamed or fried vegetables. Vegetables in a cheese sauce. Regular canned vegetables (not low-sodium or reduced-sodium). Regular canned tomato sauce and paste (not low-sodium or reduced-sodium). Regular tomato and vegetable juice (not low-sodium or reduced-sodium). Angie Fava. Olives. Grains Baked goods made with fat, such as croissants, muffins, or some breads. Dry pasta or rice meal packs. Meats and other proteins Fatty cuts of meat. Ribs. Fried meat. Berniece Salines. Bologna, salami, and other precooked or cured meats, such as sausages or meat loaves. Fat from the back of a pig (fatback). Bratwurst. Salted nuts and seeds. Canned beans with added salt. Canned or smoked fish. Whole eggs or egg yolks. Chicken or [place] with skin. Dairy Whole or 2% milk, cream, and half-and-half. Whole or full-fat cream cheese. Whole-fat or sweetened yogurt. Full-fat cheese. Nondairy creamers. Whipped toppings. Processed cheese and cheese spreads. Fats and oils Butter. Stick margarine. Lard. Shortening. Ghee. Bacon fat. Tropical oils, such as coconut, palm kernel, or palm oil. Seasonings and condiments Onion salt, garlic salt, seasoned salt, table salt, and sea salt. Worcestershire sauce. Tartar sauce. Barbecue sauce. Teriyaki sauce. Soy sauce, including reduced-sodium. Steak sauce. Canned and packaged gravies. Fish  sauce. Oyster sauce. Cocktail sauce. Store-bought horseradish. Ketchup. Mustard. Meat flavorings and tenderizers. Bouillon cubes. Hot sauces. Pre-made or packaged marinades. Pre-made or packaged taco seasonings. Relishes. Regular salad dressings. Other foods Salted popcorn and pretzels. The items listed above may not be a complete list of foods and beverages you should avoid. Contact a dietitian for more information. Where to find more information National Heart, Lung, and Blood Institute: https://wilson-eaton.com/ American Heart Association:  www.heart.org Academy of Nutrition and Dietetics: www.eatright.[place]: www.kidney.org Summary The DASH eating plan is a healthy eating plan that has been shown to reduce high blood pressure (hypertension). It may also reduce your risk for type 2 diabetes, heart disease, and stroke. When on the DASH eating plan, aim to eat more fresh fruits and vegetables, whole grains, lean proteins, low-fat dairy, and heart-healthy fats. With the DASH eating plan, you should limit salt (sodium) intake to 2,300 mg a day. If you have hypertension, you may need to reduce your sodium intake to 1,500 mg a day. Work with your health care provider or dietitian to adjust your eating plan to your individual calorie needs. This information is not intended to replace advice given to you by your health care provider. Make sure you discuss any questions you have with your health care provider. Document Revised: 10/11/2019 Document Reviewed: 10/11/2019 Elsevier Patient Education  2022 [employer].

## 2022-01-05 NOTE — Progress Notes (Signed)
Primary Care Physician: Leonard Downing, MD Primary Cardiologist: Dr Acie Fredrickson Primary Electrophysiologist: none Referring Physician: Dr Claire Shown Kristina Martin is a 76 y.o. female with a history of HTN, tobacco abuse, atrial fibrillation who presents for consultation in the [facility].  The patient was initially diagnosed with atrial fibrillation 12/17/21 at his PCP and was started on Eliquis for a CHADS2VASC score of 5. Patient presented to the ED at [facility] on 1/30 complaining of dry cough for 2 days, bilateral leg swelling for the past week. Patient was hypoxic with O2 sats in the 80s on presentation, improved with supplemental oxygen via nasal cannula. BNP elevated to 333.8. CXR showed mild cardiomegaly with mild interstitial edema. EKG showed afib with RVR (rate 144). Echo showed preserved EF with moderate to severe MR. She was rate controlled and diuresed with plan for cardioversion after 3 weeks of anticoagulation.   Today, patient remains in rate controlled afib today with symptoms of dyspnea on exertion. She denies any bleeding issues on anticoagulation. She denies alcohol use but she does admit to snoring, witnessed apnea, and daytime somnolence.   Today, she denies symptoms of palpitations, chest pain, shortness of breath, orthopnea, PND, lower extremity edema, dizziness, presyncope, syncope, bleeding, or neurologic sequela. The patient is tolerating medications without difficulties and is otherwise without complaint today.    Atrial Fibrillation Risk Factors:  she does have symptoms or diagnosis of sleep apnea. she is agreeable for sleep study. she does not have a history of rheumatic fever. she does not have a history of alcohol use. The patient does have a history of early familial atrial fibrillation or other arrhythmias. Daughter has afib.  she has a BMI of Body mass index is 20.91 kg/m.Marland Kitchen Filed Weights   01/05/22 0906  Weight: 55.2 kg     Family History  Problem Relation Age of Onset   Osteoporosis Mother    Hypertension Father    Diabetes Brother    Hypertension Brother    Hypertension Brother    Heart disease Brother      Atrial Fibrillation Management history:  Previous antiarrhythmic drugs: none Previous cardioversions: none Previous ablations: none CHADS2VASC score: 5 Anticoagulation history: Eliquis   Past Medical History:  Diagnosis Date   A-fib ([place])    per pt   Fibroid    Hypertension    Past Surgical History:  Procedure Laterality Date   CESAREAN SECTION     CHOLECYSTECTOMY     DILATION AND CURETTAGE OF UTERUS     HYSTEROSCOPY      Current Outpatient Medications  Medication Sig Dispense Refill   acetaminophen (TYLENOL) 325 MG tablet Take 650-975 mg by mouth every 4 (four) hours as needed (pain).      albuterol (VENTOLIN HFA) 108 (90 Base) MCG/ACT inhaler Inhale 2 puffs into the lungs every 6 (six) hours as needed for shortness of breath.     Calcium Carbonate-Vitamin D (CALCIUM + D PO) Take 1 tablet by mouth daily.      Cholecalciferol (VITAMIN D PO) Take 1 tablet by mouth daily.      dapagliflozin propanediol (FARXIGA) 10 MG TABS tablet Take 1 tablet (10 mg total) by mouth daily. 30 tablet 0   ELIQUIS 5 MG TABS tablet Take 5 mg by mouth in the morning and at bedtime.     furosemide (LASIX) 40 MG tablet Take 1 tablet (40 mg total) by mouth daily. 30 tablet 0   metoprolol tartrate (  LOPRESSOR) 50 MG tablet Take 1 tablet (50 mg total) by mouth 2 (two) times daily. 60 tablet 0   potassium chloride SA (KLOR-CON M) 20 MEQ tablet Take 1 tablet (20 mEq total) by mouth daily. 30 tablet 0   spironolactone (ALDACTONE) 25 MG tablet Take 1/2 tablet (12.5 mg total) by mouth daily. 30 tablet 0   No current facility-administered medications for this encounter.    No Known Allergies  Social History   Socioeconomic History   Marital status: Married    Spouse name: [name]   Number of children: 1    Years of education: Not on file   Highest education level: Not on file  Occupational History   Occupation: retired  Tobacco Use   Smoking status: Every Day    Packs/day: 0.25    Types: Cigarettes   Smokeless tobacco: Never   Tobacco comments:    2 cigarettes/day. Started at age 78.   Vaping Use   Vaping Use: Never used  Substance and Sexual Activity   Alcohol use: No   Drug use: No   Sexual activity: Not on file  Other Topics Concern   Not on file  Social History Narrative   Not on file   Social Determinants of Health   Financial Resource Strain: Low Risk    Difficulty of Paying Living Expenses: Not very hard  Food Insecurity: No Food Insecurity   Worried About Running Out of Food in the Last Year: Never true   Ran Out of Food in the Last Year: Never true  Transportation Needs: No Transportation Needs   Lack of Transportation (Medical): No   Lack of Transportation (Non-Medical): No  Physical Activity: Not on file  Stress: Not on file  Social Connections: Not on file  Intimate Partner Violence: Not on file     ROS- All systems are reviewed and negative except as per the HPI above.  Physical Exam: Vitals:   01/05/22 0906  BP: 102/80  Pulse: 67  Weight: 55.2 kg  Height: 5\' 4"  (1.626 m)    GEN- The patient is a well appearing elderly female, alert and oriented x 3 today.   Head- normocephalic, atraumatic Eyes-  Sclera clear, conjunctiva pink Ears- hearing intact Oropharynx- clear Neck- supple  Lungs- Clear to ausculation bilaterally, normal work of breathing Heart- irregular rate and rhythm, no murmurs, rubs or gallops  GI- soft, NT, ND, + BS Extremities- no clubbing, cyanosis, or edema MS- no significant deformity or atrophy Skin- no rash or lesion Psych- euthymic mood, full affect Neuro- strength and sensation are intact  Wt Readings from Last 3 Encounters:  01/05/22 55.2 kg  12/28/21 54.7 kg  05/15/13 59.9 kg    EKG today demonstrates   Afib Vent. rate 67 BPM PR interval * ms QRS duration 70 ms QT/QTcB 414/437 ms  Echo 12/23/21 demonstrated  1. Left ventricular ejection fraction, by estimation, is 55 to 60%. The  left ventricle has normal function. The left ventricle has no regional  wall motion abnormalities. There is mild concentric left ventricular  hypertrophy. Left ventricular diastolic parameters are indeterminate.   2. Right ventricular systolic function is normal. The right ventricular  size is normal. There is moderately elevated pulmonary artery systolic  pressure.   3. Left atrial size was mildly dilated.   4. Right atrial size was moderately dilated.   5. There are multple jets of MR, central and posteriorly directed. .  Moderate to severe mitral valve regurgitation.   6. Tricuspid  valve regurgitation is moderate.   7. The aortic valve is tricuspid. Aortic valve regurgitation is not  visualized. Aortic valve sclerosis/calcification is present, without any  evidence of aortic stenosis.   8. The inferior vena cava is dilated in size with <50% respiratory  variability, suggesting right atrial pressure of 15 mmHg.   Epic records are reviewed at length today  CHA2DS2-VASc Score = 5  The patient's score is based upon: CHF History: 1 HTN History: 1 Diabetes History: 0 Stroke History: 0 Vascular Disease History: 0 Age Score: 2 Gender Score: 1       ASSESSMENT AND PLAN: 1. Persistent Atrial Fibrillation (ICD10:  I48.19) The patient's CHA2DS2-VASc score is 5, indicating a 7.2% annual risk of stroke.   General education about afib provided and questions answered. We also discussed her stroke risk and the risks and benefits of anticoagulation. Continue Lopressor 50 mg BID Continue Eliquis 5 mg BID Will plan for DCCV after 3 weeks of uninterrupted anticoagulation.   2. Secondary Hypercoagulable State (ICD10:  D68.69) The patient is at significant risk for stroke/thromboembolism based upon her  CHA2DS2-VASc Score of 5.  Continue Apixaban (Eliquis).   3. Snoring/witnessed apnea/daytime somnolence  The importance of adequate treatment of sleep apnea was discussed today in order to improve our ability to maintain sinus rhythm long term. Will refer for sleep study.  4. HTN Stable, no changes today.  5. Chronic diastolic CHF Hospitalized with acute on chronic HFpEF BNP 333, currently NYHA class II Could be a candidate for Alleviate HF study, she is agreeable to speaking with research team.   6. Valvular heart disease Moderate-severe MR on echo Will recheck echo after she is back in SR If MR still appears severe, will refer to structural team.   Follow up in the AF clinic post DCCV.    [facility] [address] [place], [place] [address] [phone] 01/05/2022 1:22 PM

## 2022-01-14 ENCOUNTER — Other Ambulatory Visit (HOSPITAL_COMMUNITY): Payer: Self-pay

## 2022-01-17 ENCOUNTER — Encounter (HOSPITAL_COMMUNITY): Payer: Self-pay | Admitting: Cardiology

## 2022-01-17 NOTE — Progress Notes (Signed)
Attempted to obtain medical history via telephone, unable to reach at this time. Unable to leave voicemail to return pre surgical testing department's phone call,due to mailbox full.  

## 2022-01-24 ENCOUNTER — Encounter: Payer: Self-pay | Admitting: Cardiology

## 2022-01-24 ENCOUNTER — Other Ambulatory Visit: Payer: Self-pay

## 2022-01-24 ENCOUNTER — Ambulatory Visit: Payer: PPO | Admitting: Cardiology

## 2022-01-24 DIAGNOSIS — I4819 Other persistent atrial fibrillation: Secondary | ICD-10-CM

## 2022-01-24 DIAGNOSIS — D6869 Other thrombophilia: Secondary | ICD-10-CM

## 2022-01-24 DIAGNOSIS — I5033 Acute on chronic diastolic (congestive) heart failure: Secondary | ICD-10-CM | POA: Diagnosis not present

## 2022-01-24 DIAGNOSIS — I340 Nonrheumatic mitral (valve) insufficiency: Secondary | ICD-10-CM

## 2022-01-24 NOTE — Assessment & Plan Note (Signed)
Moderate to severe on echocardiogram.  Continue to monitor.  At some point, surgical consultation.  May consider structural clinic given her comorbidities. ?

## 2022-01-24 NOTE — H&P (View-Only) (Signed)
Cardiology Office Note:    Date:  01/24/2022   ID:  Kristina Martin, DOB 10-15-1946, MRN [id number]  PCP:  Leonard Downing, MD   [facility] Providers Cardiologist:  Mertie Moores, MD     Referring MD: Leonard Downing, *    History of Present Illness:    Kristina Martin is a 76 y.o. female here for the evaluation of possible heart failure at the request of Dr. Arelia Sneddon.  She was seen by Dr. Acie Fredrickson last on 12/28/2021.  She has cardioversion set up for tomorrow.  She was seen in the atrial fibrillation clinic by Adline Peals, PA on 01/05/2022.  Has hypertension tobacco use atrial fibrillation originally diagnosed by Dr. Arelia Sneddon on 12/17/2021.  Started on Eliquis because of CHA2DS2-VASc score of 5.  She was previously complaining of dry cough bilateral leg swelling hypoxia with oxygen sats in the 80s BNP was 333 and chest x-ray showed mild cardiomegaly with interstitial edema.  Her atrial fibrillation was rapid ventricular response at that time 144.  She did have an echocardiogram which showed normal ejection fraction with moderate to severe mitral regurgitation.  Plan was to rate control her diurese her and cardiovert her which will be happening tomorrow.  Chest x-ray showed COPD hyperinflation.  Reported that she was eating hotdogs many times during the week prior to admission.  She does state that she has a mild sore throat for a while.  She also battles with panic attacks she feels like she cannot breathe at times.  She wishes she had never started smoking.   Past Medical History:  Diagnosis Date   A-fib ([place])    per pt   Fibroid    Hypertension     Past Surgical History:  Procedure Laterality Date   CESAREAN SECTION     CHOLECYSTECTOMY     DILATION AND CURETTAGE OF UTERUS     HYSTEROSCOPY      Current Medications: Current Meds  Medication Sig   acetaminophen (TYLENOL) 325 MG tablet Take 650-975 mg by mouth every 4 (four) hours as needed (pain).    albuterol  (VENTOLIN HFA) 108 (90 Base) MCG/ACT inhaler Inhale 2 puffs into the lungs every 6 (six) hours as needed for shortness of breath.   Calcium Carbonate-Vitamin D (CALCIUM + D PO) Take 1 tablet by mouth daily.    Cholecalciferol (VITAMIN D PO) Take 1 tablet by mouth daily.    dapagliflozin propanediol (FARXIGA) 10 MG TABS tablet Take 1 tablet (10 mg total) by mouth daily.   ELIQUIS 5 MG TABS tablet Take 5 mg by mouth in the morning and at bedtime.   ferrous sulfate 324 MG TBEC Take 324 mg by mouth daily with breakfast.   furosemide (LASIX) 80 MG tablet Take 80 mg by mouth.   metoprolol tartrate (LOPRESSOR) 50 MG tablet Take 1 tablet (50 mg total) by mouth 2 (two) times daily.   potassium chloride SA (KLOR-CON M) 20 MEQ tablet Take 1 tablet (20 mEq total) by mouth daily.   spironolactone (ALDACTONE) 25 MG tablet Take 1/2 tablet (12.5 mg total) by mouth daily.     Allergies:   Patient has no known allergies.   Social History   Socioeconomic History   Marital status: Married    Spouse name: [name]   Number of children: 1   Years of education: Not on file   Highest education level: Not on file  Occupational History   Occupation: retired  Tobacco Use   Smoking  status: Every Day    Packs/day: 0.25    Types: Cigarettes   Smokeless tobacco: Never   Tobacco comments:    2 cigarettes/day. Started at age 34.   Vaping Use   Vaping Use: Never used  Substance and Sexual Activity   Alcohol use: No   Drug use: No   Sexual activity: Not on file  Other Topics Concern   Not on file  Social History Narrative   Not on file   Social Determinants of Health   Financial Resource Strain: Low Risk    Difficulty of Paying Living Expenses: Not very hard  Food Insecurity: No Food Insecurity   Worried About Running Out of Food in the Last Year: Never true   Ran Out of Food in the Last Year: Never true  Transportation Needs: No Transportation Needs   Lack of Transportation (Medical): No   Lack of  Transportation (Non-Medical): No  Physical Activity: Not on file  Stress: Not on file  Social Connections: Not on file     Family History: The patient's family history includes Diabetes in her brother; Heart disease in her brother; Hypertension in her brother, brother, and father; Osteoporosis in her mother.  ROS:   Please see the history of present illness.     All other systems reviewed and are negative.  EKGs/Labs/Other Studies Reviewed:    The following studies were reviewed today: Echocardiogram 12/23/2021  1. Left ventricular ejection fraction, by estimation, is 55 to 60%. The  left ventricle has normal function. The left ventricle has no regional  wall motion abnormalities. There is mild concentric left ventricular  hypertrophy. Left ventricular diastolic  parameters are indeterminate.   2. Right ventricular systolic function is normal. The right ventricular  size is normal. There is moderately elevated pulmonary artery systolic  pressure.   3. Left atrial size was mildly dilated.   4. Right atrial size was moderately dilated.   5. There are multple jets of MR, central and posteriorly directed. .  Moderate to severe mitral valve regurgitation.   6. Tricuspid valve regurgitation is moderate.   7. The aortic valve is tricuspid. Aortic valve regurgitation is not  visualized. Aortic valve sclerosis/calcification is present, without any  evidence of aortic stenosis.   8. The inferior vena cava is dilated in size with <50% respiratory  variability, suggesting right atrial pressure of 15 mmHg.   EKG:  EKG is  ordered today.  The ekg ordered today demonstrates atrial fibrillation 72 bpm  01/05/2022-atrial fibrillation rate controlled 61 Recent Labs: 12/21/2021: B Natriuretic Peptide 333.8 12/22/2021: ALT 10; TSH 0.449 12/26/2021: Magnesium 2.4 12/28/2021: BUN 14; Creatinine, Ser 0.74; Hemoglobin 10.2; Platelets 275; Potassium 4.0; Sodium 133  Recent Lipid Panel No results found for:  CHOL, TRIG, HDL, CHOLHDL, VLDL, LDLCALC, LDLDIRECT   Risk Assessment/Calculations:    CHA2DS2-VASc Score = 5   This indicates a 7.2% annual risk of stroke. The patient's score is based upon: CHF History: 1 HTN History: 1 Diabetes History: 0 Stroke History: 0 Vascular Disease History: 0 Age Score: 2 Gender Score: 1              Physical Exam:    VS:  BP 134/72 (BP Location: Left Arm, Patient Position: Sitting, Cuff Size: Normal)    Pulse 72    Ht '5\' 3"'$  (1.6 m)    Wt 116 lb 3.2 oz (52.7 kg)    BMI 20.58 kg/m     Wt Readings from Last 3 Encounters:  01/24/22 116 lb 3.2 oz (52.7 kg)  01/05/22 121 lb 12.8 oz (55.2 kg)  12/28/21 120 lb 11.2 oz (54.7 kg)     GEN:  Well nourished, well developed in no acute distress HEENT: Normal NECK: No JVD; No carotid bruits LYMPHATICS: No lymphadenopathy CARDIAC: Irregularly irregular, no murmurs, no rubs, gallops RESPIRATORY:  Clear to auscultation without rales, wheezing or rhonchi  ABDOMEN: Soft, non-tender, non-distended MUSCULOSKELETAL:  No edema; No deformity  SKIN: Warm and dry NEUROLOGIC:  Alert and oriented x 3 PSYCHIATRIC:  Normal affect   ASSESSMENT:    1. Persistent atrial fibrillation ([place])   2. Secondary hypercoagulable state ([place])   3. Acute on chronic diastolic CHF (congestive heart failure) ([place])   4. Nonrheumatic mitral valve regurgitation    PLAN:    In order of problems listed above:  Persistent atrial fibrillation ([place]) On for cardioversion tomorrow.  On Eliquis.  Has not missed any doses.  Secondary hypercoagulable state (HCC) Eliquis 5 mg twice a day.  No doses missed.  Hemoglobin monitored.  10.2.  High risk medication management.  Acute on chronic diastolic CHF (congestive heart failure) (H[facility]) Currently on spironolactone 12.5 mg a day as well as Lasix 80 mg a day.  Normal ejection fraction.  Moderate to severe mitral regurgitation.  Could be influenced by atrial fibrillation.  She is down to 116  pounds.  Was 121 pounds on 01/05/2022.  Discussed low-salt diet.  She is also on Farxiga 10 mg a day.  Recent lab work reviewed.  Mitral regurgitation Moderate to severe on echocardiogram.  Continue to monitor.  At some point, surgical consultation.  May consider structural clinic given her comorbidities.         Medication Adjustments/Labs and Tests Ordered: Current medicines are reviewed at length with the patient today.  Concerns regarding medicines are outlined above.  Orders Placed This Encounter  Procedures   EKG 12-Lead   No orders of the defined types were placed in this encounter.   Patient Instructions  Medication Instructions:  The current medical regimen is effective;  continue present plan and medications.  *If you need a refill on your cardiac medications before your next appointment, please call your pharmacy*  Keep appointment as scheduled for cardioversion tomorrow.  Follow-Up: At [facility], you and your health needs are our priority.  As part of our continuing mission to provide you with exceptional heart care, we have created designated Provider Care Teams.  These Care Teams include your primary Cardiologist (physician) and Advanced Practice Providers (APPs -  Physician Assistants and Nurse Practitioners) who all work together to provide you with the care you need, when you need it.  We recommend signing up for the patient portal called "MyChart".  Sign up information is provided on this After Visit Summary.  MyChart is used to connect with patients for Virtual Visits (Telemedicine).  Patients are able to view lab/test results, encounter notes, upcoming appointments, etc.  Non-urgent messages can be sent to your provider as well.   To learn more about what you can do with MyChart, go to [email or web address].    Your next appointment:   6 month(s)  The format for your next appointment:   In Person  Provider:   Mertie Moores, MD {   Thank you for  choosing [facility]!!      Signed, Candee Furbish, MD  01/24/2022 2:04 PM    [place]

## 2022-01-24 NOTE — Assessment & Plan Note (Addendum)
Currently on spironolactone 12.5 mg a day as well as Lasix 80 mg a day.  Normal ejection fraction.  Moderate to severe mitral regurgitation.  Could be influenced by atrial fibrillation.  She is down to 116 pounds.  Was 121 pounds on 01/05/2022.  Discussed low-salt diet.  She is also on Farxiga 10 mg a day.  Recent lab work reviewed. ?

## 2022-01-24 NOTE — Progress Notes (Signed)
Cardiology Office Note:    Date:  01/24/2022   ID:  Kristina Martin, DOB August 15, 1946, MRN [id number]  PCP:  Leonard Downing, MD   [facility] Providers Cardiologist:  Mertie Moores, MD     Referring MD: Leonard Downing, *    History of Present Illness:    Kristina Martin is a 76 y.o. female here for the evaluation of possible heart failure at the request of Dr. Arelia Sneddon.  She was seen by Dr. Acie Fredrickson last on 12/28/2021.  She has cardioversion set up for tomorrow.  She was seen in the atrial fibrillation clinic by Adline Peals, PA on 01/05/2022.  Has hypertension tobacco use atrial fibrillation originally diagnosed by Dr. Arelia Sneddon on 12/17/2021.  Started on Eliquis because of CHA2DS2-VASc score of 5.  She was previously complaining of dry cough bilateral leg swelling hypoxia with oxygen sats in the 80s BNP was 333 and chest x-ray showed mild cardiomegaly with interstitial edema.  Her atrial fibrillation was rapid ventricular response at that time 144.  She did have an echocardiogram which showed normal ejection fraction with moderate to severe mitral regurgitation.  Plan was to rate control her diurese her and cardiovert her which will be happening tomorrow.  Chest x-ray showed COPD hyperinflation.  Reported that she was eating hotdogs many times during the week prior to admission.  She does state that she has a mild sore throat for a while.  She also battles with panic attacks she feels like she cannot breathe at times.  She wishes she had never started smoking.   Past Medical History:  Diagnosis Date   A-fib ([place])    per pt   Fibroid    Hypertension     Past Surgical History:  Procedure Laterality Date   CESAREAN SECTION     CHOLECYSTECTOMY     DILATION AND CURETTAGE OF UTERUS     HYSTEROSCOPY      Current Medications: Current Meds  Medication Sig   acetaminophen (TYLENOL) 325 MG tablet Take 650-975 mg by mouth every 4 (four) hours as needed (pain).    albuterol  (VENTOLIN HFA) 108 (90 Base) MCG/ACT inhaler Inhale 2 puffs into the lungs every 6 (six) hours as needed for shortness of breath.   Calcium Carbonate-Vitamin D (CALCIUM + D PO) Take 1 tablet by mouth daily.    Cholecalciferol (VITAMIN D PO) Take 1 tablet by mouth daily.    dapagliflozin propanediol (FARXIGA) 10 MG TABS tablet Take 1 tablet (10 mg total) by mouth daily.   ELIQUIS 5 MG TABS tablet Take 5 mg by mouth in the morning and at bedtime.   ferrous sulfate 324 MG TBEC Take 324 mg by mouth daily with breakfast.   furosemide (LASIX) 80 MG tablet Take 80 mg by mouth.   metoprolol tartrate (LOPRESSOR) 50 MG tablet Take 1 tablet (50 mg total) by mouth 2 (two) times daily.   potassium chloride SA (KLOR-CON M) 20 MEQ tablet Take 1 tablet (20 mEq total) by mouth daily.   spironolactone (ALDACTONE) 25 MG tablet Take 1/2 tablet (12.5 mg total) by mouth daily.     Allergies:   Patient has no known allergies.   Social History   Socioeconomic History   Marital status: Married    Spouse name: [name]   Number of children: 1   Years of education: Not on file   Highest education level: Not on file  Occupational History   Occupation: retired  Tobacco Use   Smoking  status: Every Day    Packs/day: 0.25    Types: Cigarettes   Smokeless tobacco: Never   Tobacco comments:    2 cigarettes/day. Started at age 21.   Vaping Use   Vaping Use: Never used  Substance and Sexual Activity   Alcohol use: No   Drug use: No   Sexual activity: Not on file  Other Topics Concern   Not on file  Social History Narrative   Not on file   Social Determinants of Health   Financial Resource Strain: Low Risk    Difficulty of Paying Living Expenses: Not very hard  Food Insecurity: No Food Insecurity   Worried About Running Out of Food in the Last Year: Never true   Ran Out of Food in the Last Year: Never true  Transportation Needs: No Transportation Needs   Lack of Transportation (Medical): No   Lack of  Transportation (Non-Medical): No  Physical Activity: Not on file  Stress: Not on file  Social Connections: Not on file     Family History: The patient's family history includes Diabetes in her brother; Heart disease in her brother; Hypertension in her brother, brother, and father; Osteoporosis in her mother.  ROS:   Please see the history of present illness.     All other systems reviewed and are negative.  EKGs/Labs/Other Studies Reviewed:    The following studies were reviewed today: Echocardiogram 12/23/2021  1. Left ventricular ejection fraction, by estimation, is 55 to 60%. The  left ventricle has normal function. The left ventricle has no regional  wall motion abnormalities. There is mild concentric left ventricular  hypertrophy. Left ventricular diastolic  parameters are indeterminate.   2. Right ventricular systolic function is normal. The right ventricular  size is normal. There is moderately elevated pulmonary artery systolic  pressure.   3. Left atrial size was mildly dilated.   4. Right atrial size was moderately dilated.   5. There are multple jets of MR, central and posteriorly directed. .  Moderate to severe mitral valve regurgitation.   6. Tricuspid valve regurgitation is moderate.   7. The aortic valve is tricuspid. Aortic valve regurgitation is not  visualized. Aortic valve sclerosis/calcification is present, without any  evidence of aortic stenosis.   8. The inferior vena cava is dilated in size with <50% respiratory  variability, suggesting right atrial pressure of 15 mmHg.   EKG:  EKG is  ordered today.  The ekg ordered today demonstrates atrial fibrillation 72 bpm  01/05/2022-atrial fibrillation rate controlled 61 Recent Labs: 12/21/2021: B Natriuretic Peptide 333.8 12/22/2021: ALT 10; TSH 0.449 12/26/2021: Magnesium 2.4 12/28/2021: BUN 14; Creatinine, Ser 0.74; Hemoglobin 10.2; Platelets 275; Potassium 4.0; Sodium 133  Recent Lipid Panel No results found for:  CHOL, TRIG, HDL, CHOLHDL, VLDL, LDLCALC, LDLDIRECT   Risk Assessment/Calculations:    CHA2DS2-VASc Score = 5   This indicates a 7.2% annual risk of stroke. The patient's score is based upon: CHF History: 1 HTN History: 1 Diabetes History: 0 Stroke History: 0 Vascular Disease History: 0 Age Score: 2 Gender Score: 1              Physical Exam:    VS:  BP 134/72 (BP Location: Left Arm, Patient Position: Sitting, Cuff Size: Normal)    Pulse 72    Ht '5\' 3"'$  (1.6 m)    Wt 116 lb 3.2 oz (52.7 kg)    BMI 20.58 kg/m     Wt Readings from Last 3 Encounters:  01/24/22 116 lb 3.2 oz (52.7 kg)  01/05/22 121 lb 12.8 oz (55.2 kg)  12/28/21 120 lb 11.2 oz (54.7 kg)     GEN:  Well nourished, well developed in no acute distress HEENT: Normal NECK: No JVD; No carotid bruits LYMPHATICS: No lymphadenopathy CARDIAC: Irregularly irregular, no murmurs, no rubs, gallops RESPIRATORY:  Clear to auscultation without rales, wheezing or rhonchi  ABDOMEN: Soft, non-tender, non-distended MUSCULOSKELETAL:  No edema; No deformity  SKIN: Warm and dry NEUROLOGIC:  Alert and oriented x 3 PSYCHIATRIC:  Normal affect   ASSESSMENT:    1. Persistent atrial fibrillation ([place])   2. Secondary hypercoagulable state ([place])   3. Acute on chronic diastolic CHF (congestive heart failure) ([place])   4. Nonrheumatic mitral valve regurgitation    PLAN:    In order of problems listed above:  Persistent atrial fibrillation ([place]) On for cardioversion tomorrow.  On Eliquis.  Has not missed any doses.  Secondary hypercoagulable state (HCC) Eliquis 5 mg twice a day.  No doses missed.  Hemoglobin monitored.  10.2.  High risk medication management.  Acute on chronic diastolic CHF (congestive heart failure) (H[facility]) Currently on spironolactone 12.5 mg a day as well as Lasix 80 mg a day.  Normal ejection fraction.  Moderate to severe mitral regurgitation.  Could be influenced by atrial fibrillation.  She is down to 116  pounds.  Was 121 pounds on 01/05/2022.  Discussed low-salt diet.  She is also on Farxiga 10 mg a day.  Recent lab work reviewed.  Mitral regurgitation Moderate to severe on echocardiogram.  Continue to monitor.  At some point, surgical consultation.  May consider structural clinic given her comorbidities.         Medication Adjustments/Labs and Tests Ordered: Current medicines are reviewed at length with the patient today.  Concerns regarding medicines are outlined above.  Orders Placed This Encounter  Procedures   EKG 12-Lead   No orders of the defined types were placed in this encounter.   Patient Instructions  Medication Instructions:  The current medical regimen is effective;  continue present plan and medications.  *If you need a refill on your cardiac medications before your next appointment, please call your pharmacy*  Keep appointment as scheduled for cardioversion tomorrow.  Follow-Up: At [facility], you and your health needs are our priority.  As part of our continuing mission to provide you with exceptional heart care, we have created designated Provider Care Teams.  These Care Teams include your primary Cardiologist (physician) and Advanced Practice Providers (APPs -  Physician Assistants and Nurse Practitioners) who all work together to provide you with the care you need, when you need it.  We recommend signing up for the patient portal called "MyChart".  Sign up information is provided on this After Visit Summary.  MyChart is used to connect with patients for Virtual Visits (Telemedicine).  Patients are able to view lab/test results, encounter notes, upcoming appointments, etc.  Non-urgent messages can be sent to your provider as well.   To learn more about what you can do with MyChart, go to [email or web address].    Your next appointment:   6 month(s)  The format for your next appointment:   In Person  Provider:   Mertie Moores, MD {   Thank you for  choosing [facility]!!      Signed, Candee Furbish, MD  01/24/2022 2:04 PM    [place]

## 2022-01-24 NOTE — Assessment & Plan Note (Addendum)
Eliquis 5 mg twice a day.  No doses missed.  Hemoglobin monitored.  10.2.  High risk medication management. ?

## 2022-01-24 NOTE — Assessment & Plan Note (Signed)
On for cardioversion tomorrow.  On Eliquis.  Has not missed any doses. ?

## 2022-01-24 NOTE — Patient Instructions (Signed)
Medication Instructions:  ?The current medical regimen is effective;  continue present plan and medications. ? ?*If you need a refill on your cardiac medications before your next appointment, please call your pharmacy* ? ?Keep appointment as scheduled for cardioversion tomorrow. ? ?Follow-Up: ?At [facility], you and your health needs are our priority.  As part of our continuing mission to provide you with exceptional heart care, we have created designated Provider Care Teams.  These Care Teams include your primary Cardiologist (physician) and Advanced Practice Providers (APPs -  Physician Assistants and Nurse Practitioners) who all work together to provide you with the care you need, when you need it. ? ?We recommend signing up for the patient portal called "MyChart".  Sign up information is provided on this After Visit Summary.  MyChart is used to connect with patients for Virtual Visits (Telemedicine).  Patients are able to view lab/test results, encounter notes, upcoming appointments, etc.  Non-urgent messages can be sent to your provider as well.   ?To learn more about what you can do with MyChart, go to [email or web address].   ? ?Your next appointment:   ?6 month(s) ? ?The format for your next appointment:   ?In Person ? ?Provider:   ?Mertie Moores, MD { ? ? ?Thank you for choosing [place]!! ? ? ? ?

## 2022-01-25 ENCOUNTER — Ambulatory Visit (HOSPITAL_COMMUNITY)
Admission: RE | Admit: 2022-01-25 | Discharge: 2022-01-25 | Disposition: A | Discharge status: Home / Self Care | Payer: PPO | Source: Ambulatory Visit | Attending: Physician Assistant | Admitting: Physician Assistant

## 2022-01-25 ENCOUNTER — Ambulatory Visit (HOSPITAL_COMMUNITY): Payer: PPO | Admitting: Certified Registered"

## 2022-01-25 ENCOUNTER — Encounter (HOSPITAL_COMMUNITY)
Admission: RE | Disposition: A | Discharge status: Home / Self Care | Payer: Self-pay | Source: Home / Self Care | Attending: Cardiology

## 2022-01-25 ENCOUNTER — Ambulatory Visit (HOSPITAL_BASED_OUTPATIENT_CLINIC_OR_DEPARTMENT_OTHER): Payer: PPO | Admitting: Certified Registered"

## 2022-01-25 ENCOUNTER — Ambulatory Visit (HOSPITAL_COMMUNITY)
Admission: RE | Admit: 2022-01-25 | Discharge: 2022-01-25 | Disposition: A | Discharge status: Home / Self Care | Payer: PPO | Attending: Cardiology | Admitting: Cardiology

## 2022-01-25 DIAGNOSIS — I110 Hypertensive heart disease with heart failure: Secondary | ICD-10-CM | POA: Insufficient documentation

## 2022-01-25 DIAGNOSIS — F17210 Nicotine dependence, cigarettes, uncomplicated: Secondary | ICD-10-CM | POA: Insufficient documentation

## 2022-01-25 DIAGNOSIS — I4891 Unspecified atrial fibrillation: Secondary | ICD-10-CM | POA: Diagnosis not present

## 2022-01-25 DIAGNOSIS — I509 Heart failure, unspecified: Secondary | ICD-10-CM

## 2022-01-25 DIAGNOSIS — I4819 Other persistent atrial fibrillation: Secondary | ICD-10-CM

## 2022-01-25 HISTORY — PX: CARDIOVERSION: SHX1299

## 2022-01-25 LAB — CBC
HCT: 43.1 % (ref 36.0–46.0)
Hemoglobin: 13.7 g/dL (ref 12.0–15.0)
MCH: 28.3 pg (ref 26.0–34.0)
MCHC: 31.8 g/dL (ref 30.0–36.0)
MCV: 89 fL (ref 80.0–100.0)
Platelets: 208 10*3/uL (ref 150–400)
RBC: 4.84 MIL/uL (ref 3.87–5.11)
RDW: 17.2 % — ABNORMAL HIGH (ref 11.5–15.5)
WBC: 5.7 10*3/uL (ref 4.0–10.5)
nRBC: 0 % (ref 0.0–0.2)

## 2022-01-25 LAB — BASIC METABOLIC PANEL
Anion gap: 7 (ref 5–15)
BUN: 7 mg/dL — ABNORMAL LOW (ref 8–23)
CO2: 29 mmol/L (ref 22–32)
Calcium: 9.4 mg/dL (ref 8.9–10.3)
Chloride: 93 mmol/L — ABNORMAL LOW (ref 98–111)
Creatinine, Ser: 0.97 mg/dL (ref 0.44–1.00)
GFR, Estimated: >60 mL/min (ref >60)
Glucose, Bld: 121 mg/dL — ABNORMAL HIGH (ref 70–99)
Potassium: 4.3 mmol/L (ref 3.5–5.1)
Sodium: 129 mmol/L — ABNORMAL LOW (ref 135–145)

## 2022-01-25 SURGERY — CARDIOVERSION
Anesthesia: General

## 2022-01-25 MED ORDER — SODIUM CHLORIDE 0.9 % IV SOLN: INTRAVENOUS | Status: DC

## 2022-01-25 MED ORDER — PROPOFOL 10 MG/ML IV BOLUS
INTRAVENOUS | Status: DC | PRN
  Administered 2022-01-25: 50 mg via INTRAVENOUS

## 2022-01-25 MED ORDER — LIDOCAINE 2% (20 MG/ML) 5 ML SYRINGE
INTRAMUSCULAR | Status: DC | PRN
  Administered 2022-01-25: 20 mg via INTRAVENOUS

## 2022-01-25 NOTE — Transfer of Care (Signed)
Immediate Anesthesia Transfer of Care Note ? ?Patient: Kristina Martin ? ?Procedure(s) Performed: CARDIOVERSION ? ?Patient Location: Endoscopy Unit ? ?Anesthesia Type:General ? ?Level of Consciousness: drowsy and patient cooperative ? ?Airway & Oxygen Therapy: Patient Spontanous Breathing ? ?Post-op Assessment: Report given to RN, Post -op Vital signs reviewed and stable and Patient moving all extremities X 4 ? ?Post vital signs: Reviewed and stable ? ?Last Vitals:  ?Vitals Value Taken Time  ?BP    ?Temp    ?Pulse    ?Resp    ?SpO2    ? ? ?Last Pain: There were no vitals filed for this visit.   ? ?  ? ?Complications: No notable events documented. ?

## 2022-01-25 NOTE — Anesthesia Preprocedure Evaluation (Addendum)
Anesthesia Evaluation  ?Patient identified by MRN, date of birth, ID band ?Patient awake ? ? ? ?Reviewed: ?Allergy & Precautions, NPO status , [name] Chart, lab work & pertinent test results ? ?Airway ?Mallampati: II ? ?TM Distance: >3 FB ? ? ? ? Dental ? ?(+) Dental Advisory Given ?  ?Pulmonary ?Current Smoker,  ?  ?breath sounds clear to auscultation ? ? ? ? ? ? Cardiovascular ?hypertension, Pt. on medications ?+CHF  ?+ dysrhythmias Atrial Fibrillation + Valvular Problems/Murmurs MR  ?Rhythm:Irregular Rate:Normal ? ? ?  ?Neuro/Psych ?negative neurological ROS ?   ? GI/Hepatic ?negative GI ROS, Neg liver ROS,   ?Endo/Other  ?negative endocrine ROS ? Renal/GU ?negative Renal ROS  ? ?  ?Musculoskeletal ? ? Abdominal ?  ?Peds ? Hematology ?negative hematology ROS ?(+)   ?Anesthesia Other Findings ? ? Reproductive/Obstetrics ? ?  ? ? ? ? ? ? ? ? ? ? ? ? ? ?  ?  ? ? ? ? ? ? ? ? ?Anesthesia Physical ?Anesthesia Plan ? ?ASA: 3 ? ?Anesthesia Plan: General  ? ?Post-op Pain Management: Minimal or no pain anticipated  ? ?Induction: Intravenous ? ?PONV Risk Score and Plan: 2 and Treatment may vary due to age or medical condition ? ?Airway Management Planned: Mask and Natural Airway ? ?Additional Equipment:  ? ?Intra-op Plan:  ? ?Post-operative Plan:  ? ?Informed Consent: I have reviewed the patients History and Physical, chart, labs and discussed the procedure including the risks, benefits and alternatives for the proposed anesthesia with the patient or authorized representative who has indicated his/her understanding and acceptance.  ? ? ? ? ? ?Plan Discussed with:  ? ?Anesthesia Plan Comments:   ? ? ? ? ? ? ?Anesthesia Quick Evaluation ? ?

## 2022-01-25 NOTE — Discharge Instructions (Signed)

## 2022-01-25 NOTE — CV Procedure (Signed)
Procedure:   DCCV ? ?Indication:  Symptomatic atrial fibrillation ? ?Procedure Note:  The patient signed informed consent.  They have had had therapeutic anticoagulation with Eliquis greater than 3 weeks.  Anesthesia was administered by Dr. Ola Spurr and Leane Platt, CRNA.  Adequate airway was maintained throughout and vital followed per protocol.  They were cardioverted x 1 with 200J of biphasic synchronized energy.  They converted to NSR with rate 70s.  There were no apparent complications.  The patient had normal neuro status and respiratory status post procedure with vitals stable as recorded elsewhere.   ? ?Follow up:  They will continue on current medical therapy and follow up with cardiology as scheduled. ? ?Kristina Milian, MD ?01/25/2022 ?12:25 PM  ? ?

## 2022-01-25 NOTE — Interval H&P Note (Signed)
History and Physical Interval Note: ? ?01/25/2022 ?12:24 PM ? ?Kristina Martin  has presented today for surgery, with the diagnosis of AFIB.  The various methods of treatment have been discussed with the patient and family. After consideration of risks, benefits and other options for treatment, the patient has consented to  Procedure(s): ?CARDIOVERSION (N/A) as a surgical intervention.  The patient's history has been reviewed, patient examined, no change in status, stable for surgery.  I have reviewed the patient's chart and labs.  Questions were answered to the patient's satisfaction.   ? ? ?Donato Heinz ? ? ?

## 2022-01-26 ENCOUNTER — Encounter (HOSPITAL_COMMUNITY): Payer: Self-pay | Admitting: Cardiology

## 2022-01-28 NOTE — Anesthesia Postprocedure Evaluation (Signed)
Anesthesia Post Note ? ?Patient: WATEEN VARON ? ?Procedure(s) Performed: CARDIOVERSION ? ?  ? ?Patient location during evaluation: PACU ?Anesthesia Type: General ?Level of consciousness: awake and alert ?Pain management: pain level controlled ?Vital Signs Assessment: post-procedure vital signs reviewed and stable ?Respiratory status: spontaneous breathing, nonlabored ventilation, respiratory function stable and patient connected to nasal cannula oxygen ?Cardiovascular status: blood pressure returned to baseline and stable ?Postop Assessment: no apparent nausea or vomiting ?Anesthetic complications: no ? ? ?No notable events documented. ? ?Last Vitals:  ?Vitals:  ? 01/25/22 1225 01/25/22 1235  ?BP: 130/62 (!) 131/57  ?Pulse: 69 66  ?Resp: 20 18  ?Temp:    ?SpO2: 96% 97%  ?  ?Last Pain:  ?Vitals:  ? 01/25/22 1235  ?TempSrc:   ?PainSc: 0-No pain  ? ? ?  ?  ?  ?  ?  ?  ? ?Suzette Battiest E ? ? ? ? ?

## 2022-02-01 ENCOUNTER — Encounter (HOSPITAL_COMMUNITY): Payer: Self-pay | Admitting: Physician Assistant

## 2022-02-01 ENCOUNTER — Ambulatory Visit (HOSPITAL_COMMUNITY)
Admission: RE | Admit: 2022-02-01 | Discharge: 2022-02-01 | Disposition: A | Discharge status: Home / Self Care | Payer: PPO | Source: Ambulatory Visit | Attending: Physician Assistant | Admitting: Physician Assistant

## 2022-02-01 ENCOUNTER — Other Ambulatory Visit: Payer: Self-pay

## 2022-02-01 VITALS — BP 118/70 | HR 57 | Ht 63.0 in | Wt 117.2 lb

## 2022-02-01 DIAGNOSIS — I493 Ventricular premature depolarization: Secondary | ICD-10-CM | POA: Insufficient documentation

## 2022-02-01 DIAGNOSIS — I340 Nonrheumatic mitral (valve) insufficiency: Secondary | ICD-10-CM | POA: Insufficient documentation

## 2022-02-01 DIAGNOSIS — Z79899 Other long term (current) drug therapy: Secondary | ICD-10-CM | POA: Diagnosis not present

## 2022-02-01 DIAGNOSIS — Z8249 Family history of ischemic heart disease and other diseases of the circulatory system: Secondary | ICD-10-CM | POA: Diagnosis not present

## 2022-02-01 DIAGNOSIS — G4710 Hypersomnia, unspecified: Secondary | ICD-10-CM | POA: Diagnosis not present

## 2022-02-01 DIAGNOSIS — I491 Atrial premature depolarization: Secondary | ICD-10-CM | POA: Diagnosis not present

## 2022-02-01 DIAGNOSIS — R0683 Snoring: Secondary | ICD-10-CM | POA: Insufficient documentation

## 2022-02-01 DIAGNOSIS — Z87891 Personal history of nicotine dependence: Secondary | ICD-10-CM | POA: Diagnosis not present

## 2022-02-01 DIAGNOSIS — G4739 Other sleep apnea: Secondary | ICD-10-CM | POA: Diagnosis not present

## 2022-02-01 DIAGNOSIS — D6869 Other thrombophilia: Secondary | ICD-10-CM | POA: Diagnosis not present

## 2022-02-01 DIAGNOSIS — R9431 Abnormal electrocardiogram [ECG] [EKG]: Secondary | ICD-10-CM | POA: Diagnosis not present

## 2022-02-01 DIAGNOSIS — Z7901 Long term (current) use of anticoagulants: Secondary | ICD-10-CM | POA: Diagnosis not present

## 2022-02-01 DIAGNOSIS — I5032 Chronic diastolic (congestive) heart failure: Secondary | ICD-10-CM | POA: Diagnosis not present

## 2022-02-01 DIAGNOSIS — I4819 Other persistent atrial fibrillation: Secondary | ICD-10-CM | POA: Diagnosis present

## 2022-02-01 DIAGNOSIS — I110 Hypertensive heart disease with heart failure: Secondary | ICD-10-CM | POA: Insufficient documentation

## 2022-02-01 NOTE — Progress Notes (Signed)
? ? ?Primary Care Physician: Leonard Downing, MD ?Primary Cardiologist: Dr Acie Fredrickson ?Primary Electrophysiologist: none ?Referring Physician: Dr Acie Fredrickson ? ? ?Kristina Martin is a 76 y.o. female with a history of HTN, tobacco abuse, atrial fibrillation who presents for follow up in the [facility].  The patient was initially diagnosed with atrial fibrillation 12/17/21 at his PCP and was started on Eliquis for a CHADS2VASC score of 5. Patient presented to the ED at [facility] on 1/30 complaining of dry cough for 2 days, bilateral leg swelling for the past week. Patient was hypoxic with O2 sats in the 80s on presentation, improved with supplemental oxygen via nasal cannula. BNP elevated to 333.8. CXR showed mild cardiomegaly with mild interstitial edema. EKG showed afib with RVR (rate 144). Echo showed preserved EF with moderate to severe MR. She was rate controlled and diuresed with plan for cardioversion after 3 weeks of anticoagulation.  ? ?On follow up today, patient reports that she feels the same in [name] but when asked about her SOB she states it is "good". Her weight is stable. No symptoms of fluid overload today.  ? ?Today, she denies symptoms of palpitations, chest pain, shortness of breath, orthopnea, PND, lower extremity edema, dizziness, presyncope, syncope, bleeding, or neurologic sequela. The patient is tolerating medications without difficulties and is otherwise without complaint today.  ? ? ?Atrial Fibrillation Risk Factors: ? ?she does have symptoms or diagnosis of sleep apnea. ?she is agreeable for sleep study. ?she does not have a history of rheumatic fever. ?she does not have a history of alcohol use. ?The patient does have a history of early familial atrial fibrillation or other arrhythmias. Daughter has afib. ? ?she has a BMI of Body mass index is 20.76 kg/m?Marland KitchenMarland Kitchen ?Filed Weights  ? 02/01/22 1335  ?Weight: 53.2 kg  ? ? ? ?Family History  ?Problem Relation Age of Onset  ?  Osteoporosis Mother   ? Hypertension Father   ? Diabetes Brother   ? Hypertension Brother   ? Hypertension Brother   ? Heart disease Brother   ? ? ? ?Atrial Fibrillation Management history: ? ?Previous antiarrhythmic drugs: none ?Previous cardioversions: 01/25/22 ?Previous ablations: none ?CHADS2VASC score: 5 ?Anticoagulation history: Eliquis ? ? ?Past Medical History:  ?Diagnosis Date  ? A-fib ([place])   ? per pt  ? Fibroid   ? Hypertension   ? ?Past Surgical History:  ?Procedure Laterality Date  ? CARDIOVERSION N/A 01/25/2022  ? Procedure: CARDIOVERSION;  Surgeon: Donato Heinz, MD;  Location: [facility] ENDOSCOPY;  Service: Cardiovascular;  Laterality: N/A;  ? CESAREAN SECTION    ? CHOLECYSTECTOMY    ? DILATION AND CURETTAGE OF UTERUS    ? HYSTEROSCOPY    ? ? ?Current Outpatient Medications  ?Medication Sig Dispense Refill  ? acetaminophen (TYLENOL) 325 MG tablet Take 650-975 mg by mouth every 4 (four) hours as needed (pain).     ? albuterol (VENTOLIN HFA) 108 (90 Base) MCG/ACT inhaler Inhale 2 puffs into the lungs every 6 (six) hours as needed for shortness of breath.    ? Calcium Carbonate-Vitamin D (CALCIUM + D PO) Take 1 tablet by mouth daily.     ? Cholecalciferol (VITAMIN D PO) Take 1 tablet by mouth daily.     ? dapagliflozin propanediol (FARXIGA) 10 MG TABS tablet Take 1 tablet (10 mg total) by mouth daily. 30 tablet 0  ? ELIQUIS 5 MG TABS tablet Take 5 mg by mouth in the morning and at bedtime.    ?  ferrous sulfate 324 MG TBEC Take 324 mg by mouth daily with breakfast.    ? furosemide (LASIX) 80 MG tablet Take 80 mg by mouth.    ? metoprolol tartrate (LOPRESSOR) 50 MG tablet Take 1 tablet (50 mg total) by mouth 2 (two) times daily. 60 tablet 0  ? potassium chloride SA (KLOR-CON M) 20 MEQ tablet Take 1 tablet (20 mEq total) by mouth daily. 30 tablet 0  ? spironolactone (ALDACTONE) 25 MG tablet Take 1/2 tablet (12.5 mg total) by mouth daily. 30 tablet 0  ? ?No current facility-administered medications for this  encounter.  ? ? ?No Known Allergies ? ?Social History  ? ?Socioeconomic History  ? Marital status: Married  ?  Spouse name: Jenny Reichmann  ? Number of children: 1  ? Years of education: Not on file  ? Highest education level: Not on file  ?Occupational History  ? Occupation: retired  ?Tobacco Use  ? Smoking status: Former  ?  Packs/day: 0.25  ?  Types: Cigarettes  ? Smokeless tobacco: Never  ? Tobacco comments:  ?  Former smoker. 2 cigarettes/day. Started at age 70.   ?Vaping Use  ? Vaping Use: Never used  ?Substance and Sexual Activity  ? Alcohol use: No  ? Drug use: No  ? Sexual activity: Not on file  ?Other Topics Concern  ? Not on file  ?Social History Narrative  ? Not on file  ? ?Social Determinants of Health  ? ?Financial Resource Strain: Low Risk   ? Difficulty of Paying Living Expenses: Not very hard  ?Food Insecurity: No Food Insecurity  ? Worried About Charity fundraiser in the Last Year: Never true  ? Ran Out of Food in the Last Year: Never true  ?Transportation Needs: No Transportation Needs  ? Lack of Transportation (Medical): No  ? Lack of Transportation (Non-Medical): No  ?Physical Activity: Not on file  ?Stress: Not on file  ?Social Connections: Not on file  ?Intimate Partner Violence: Not on file  ? ? ? ?ROS- All systems are reviewed and negative except as per the HPI above. ? ?Physical Exam: ?Vitals:  ? 02/01/22 1335  ?BP: 118/70  ?Pulse: (!) 57  ?SpO2: 96%  ?Weight: 53.2 kg  ?Height: '5\' 3"'$  (1.6 m)  ? ? ?GEN- The patient is a well appearing elderly female, alert and oriented x 3 today.   ?HEENT-head normocephalic, atraumatic, sclera clear, conjunctiva pink, hearing intact, trachea midline. ?Lungs- Clear to ausculation bilaterally, normal work of breathing ?Heart- Regular rate and rhythm, occasional ectopic beat, no murmurs, rubs or gallops  ?GI- soft, NT, ND, + BS ?Extremities- no clubbing, cyanosis, or edema ?MS- no significant deformity or atrophy ?Skin- no rash or lesion ?Psych- euthymic mood, full  affect ?Neuro- strength and sensation are intact ? ? ?Wt Readings from Last 3 Encounters:  ?02/01/22 53.2 kg  ?01/25/22 52.7 kg  ?01/24/22 52.7 kg  ? ? ?EKG today demonstrates  ?SB, PVC, PAC ?Vent. rate 57 BPM ?PR interval 158 ms ?QRS duration 76 ms ?QT/QTcB 464/451 ms ? ?Echo 12/23/21 demonstrated  ?1. Left ventricular ejection fraction, by estimation, is 55 to 60%. The  ?left ventricle has normal function. The left ventricle has no regional  ?wall motion abnormalities. There is mild concentric left ventricular  ?hypertrophy. Left ventricular diastolic parameters are indeterminate.  ? 2. Right ventricular systolic function is normal. The right ventricular  ?size is normal. There is moderately elevated pulmonary artery systolic  ?pressure.  ? 3. Left atrial  size was mildly dilated.  ? 4. Right atrial size was moderately dilated.  ? 5. There are multple jets of MR, central and posteriorly directed. Marland Kitchen  ?Moderate to severe mitral valve regurgitation.  ? 6. Tricuspid valve regurgitation is moderate.  ? 7. The aortic valve is tricuspid. Aortic valve regurgitation is not  ?visualized. Aortic valve sclerosis/calcification is present, without any  ?evidence of aortic stenosis.  ? 8. The inferior vena cava is dilated in size with <50% respiratory  ?variability, suggesting right atrial pressure of 15 mmHg.  ? ?Epic records are reviewed at length today ? ?CHA2DS2-VASc Score = 5  ?The patient's score is based upon: ?CHF History: 1 ?HTN History: 1 ?Diabetes History: 0 ?Stroke History: 0 ?Vascular Disease History: 0 ?Age Score: 2 ?Gender Score: 1 ?    ? ? ?ASSESSMENT AND PLAN: ?1. Persistent Atrial Fibrillation (ICD10:  I48.19) ?The patient's CHA2DS2-VASc score is 5, indicating a 7.2% annual risk of stroke.   ?S/p DCCV on 01/25/22 ?Patient back in SR. ?Continue Lopressor 50 mg BID ?Continue Eliquis 5 mg BID ? ?2. Secondary Hypercoagulable State (ICD10:  D68.69) ?The patient is at significant risk for stroke/thromboembolism based upon  her CHA2DS2-VASc Score of 5.  Continue Apixaban (Eliquis).  ? ?3. Snoring/witnessed apnea/daytime somnolence  ?Referred for sleep study. ? ?4. HTN ?Stable, no changes today. ? ?5. Chronic diastolic CHF ?App

## 2022-02-15 ENCOUNTER — Ambulatory Visit (HOSPITAL_COMMUNITY)
Admission: RE | Admit: 2022-02-15 | Discharge: 2022-02-15 | Disposition: A | Discharge status: Home / Self Care | Payer: PPO | Source: Ambulatory Visit | Attending: Physician Assistant | Admitting: Physician Assistant

## 2022-02-15 ENCOUNTER — Other Ambulatory Visit: Payer: Self-pay

## 2022-02-15 DIAGNOSIS — I052 Rheumatic mitral stenosis with insufficiency: Secondary | ICD-10-CM | POA: Insufficient documentation

## 2022-02-15 DIAGNOSIS — I10 Essential (primary) hypertension: Secondary | ICD-10-CM | POA: Diagnosis not present

## 2022-02-15 DIAGNOSIS — I4891 Unspecified atrial fibrillation: Secondary | ICD-10-CM | POA: Insufficient documentation

## 2022-02-15 DIAGNOSIS — I340 Nonrheumatic mitral (valve) insufficiency: Secondary | ICD-10-CM | POA: Diagnosis not present

## 2022-02-15 LAB — ECHOCARDIOGRAM COMPLETE
Area-P 1/2: 2.87 cm2
S' Lateral: 2.4 cm

## 2022-02-18 ENCOUNTER — Encounter (HOSPITAL_COMMUNITY): Payer: Self-pay | Admitting: *Deleted

## 2022-03-02 ENCOUNTER — Telehealth: Payer: Self-pay | Admitting: *Deleted

## 2022-03-02 NOTE — Telephone Encounter (Signed)
Prior Authorization for split night sent to [facility] via Fax.  ? APPROVED-[place] READ ?VALID- 4/30-7/29-AUTH# [id number] ? ?

## 2022-05-10 ENCOUNTER — Encounter: Payer: Self-pay | Admitting: Internal Medicine

## 2022-08-29 ENCOUNTER — Encounter: Payer: Self-pay | Admitting: *Deleted

## 2022-08-29 ENCOUNTER — Telehealth: Payer: Self-pay | Admitting: *Deleted

## 2022-08-29 NOTE — Telephone Encounter (Signed)
Letter has been sent to patient informing them that their sleep study has expired. Patient will need to call and schedule an office visit to re-evaluate the need for a sleep study.    

## 2022-10-31 ENCOUNTER — Encounter: Payer: Self-pay | Admitting: Cardiology

## 2022-10-31 ENCOUNTER — Ambulatory Visit: Payer: PPO | Attending: Cardiology | Admitting: Cardiology

## 2022-10-31 VITALS — BP 150/90 | HR 76 | Ht 62.0 in | Wt 133.2 lb

## 2022-10-31 DIAGNOSIS — I5033 Acute on chronic diastolic (congestive) heart failure: Secondary | ICD-10-CM | POA: Diagnosis not present

## 2022-10-31 DIAGNOSIS — Z79899 Other long term (current) drug therapy: Secondary | ICD-10-CM | POA: Diagnosis not present

## 2022-10-31 DIAGNOSIS — I4819 Other persistent atrial fibrillation: Secondary | ICD-10-CM | POA: Diagnosis not present

## 2022-10-31 DIAGNOSIS — I10 Essential (primary) hypertension: Secondary | ICD-10-CM | POA: Diagnosis not present

## 2022-10-31 NOTE — Patient Instructions (Addendum)
Medication Instructions:  Please increase your Furosemide to 80 mg twice a day. Continue all other medications as listed.  *If you need a refill on your cardiac medications before your next appointment, please call your pharmacy*  Lab Work: Please have blood work today (CBC, BMP)  If you have labs (blood work) drawn today and your tests are completely normal, you will receive your results only by: MyChart Message (if you have [place]) OR A paper copy in the mail If you have any lab test that is abnormal or we need to change your treatment, we will call you to review the results.  Follow-Up: At [facility], you and your health needs are our priority.  As part of our continuing mission to provide you with exceptional heart care, we have created designated Provider Care Teams.  These Care Teams include your primary Cardiologist (physician) and Advanced Practice Providers (APPs -  Physician Assistants and Nurse Practitioners) who all work together to provide you with the care you need, when you need it.  We recommend signing up for the patient portal called "MyChart".  Sign up information is provided on this After Visit Summary.  MyChart is used to connect with patients for Virtual Visits (Telemedicine).  Patients are able to view lab/test results, encounter notes, upcoming appointments, etc.  Non-urgent messages can be sent to your provider as well.   To learn more about what you can do with MyChart, go to [email or web address].    Your next appointment:   1  week(s)  The format for your next appointment:   In Person  Provider:   Mertie Moores, MD  or any NP/ PA.     Important Information About Sugar

## 2022-10-31 NOTE — Progress Notes (Signed)
Cardiology Office Note:    Date:  10/31/2022   ID:  [name], DOB [birth year], MRN [id number]  PCP:  Leonard Downing, MD   [place] Providers Cardiologist:  Mertie Moores, MD     Referring MD: Leonard Downing, *    History of Present Illness:    Kristina Martin is a 76 y.o. female patient of Dr. Elmarie Shiley with hypertension tobacco use atrial fibrillation seen in the atrial fibrillation clinic previously with Eliquis CHADSVASc 5 with prior EKG A-fib RVR 144 beats a minute.  Echocardiogram showed normal EF with moderate to severe mitral regurgitation.  She comes in today feeling short of breath that has been progressive over the past week or so.  Positive orthopnea.  She quit smoking at her prior hospitalization in March 2023.  Denies any fevers chills cough  Past Medical History:  Diagnosis Date   A-fib ([place])    per pt   Fibroid    Hypertension     Past Surgical History:  Procedure Laterality Date   CARDIOVERSION N/A 01/25/2022   Procedure: CARDIOVERSION;  Surgeon: Donato Heinz, MD;  Location: [facility] ENDOSCOPY;  Service: Cardiovascular;  Laterality: N/A;   CESAREAN SECTION     CHOLECYSTECTOMY     DILATION AND CURETTAGE OF UTERUS     HYSTEROSCOPY      Current Medications: Current Meds  Medication Sig   acetaminophen (TYLENOL) 325 MG tablet Take 650-975 mg by mouth every 4 (four) hours as needed (pain).    albuterol (VENTOLIN HFA) 108 (90 Base) MCG/ACT inhaler Inhale 2 puffs into the lungs every 6 (six) hours as needed for shortness of breath.   Calcium Carbonate-Vitamin D (CALCIUM + D PO) Take 1 tablet by mouth daily.    Cholecalciferol (VITAMIN D PO) Take 1 tablet by mouth daily.    dapagliflozin propanediol (FARXIGA) 10 MG TABS tablet Take 1 tablet (10 mg total) by mouth daily.   ELIQUIS 5 MG TABS tablet Take 5 mg by mouth in the morning and at bedtime.   ferrous sulfate 324 MG TBEC Take 324 mg by mouth daily with breakfast.    furosemide (LASIX) 80 MG tablet Take 80 mg by mouth.   metoprolol tartrate (LOPRESSOR) 50 MG tablet Take 1 tablet (50 mg total) by mouth 2 (two) times daily.   potassium chloride SA (KLOR-CON M) 20 MEQ tablet Take 1 tablet (20 mEq total) by mouth daily.   spironolactone (ALDACTONE) 25 MG tablet Take 1/2 tablet (12.5 mg total) by mouth daily.     Allergies:   Patient has no known allergies.   Social History   Socioeconomic History   Marital status: Married    Spouse name: [name]   Number of children: 1   Years of education: Not on file   Highest education level: Not on file  Occupational History   Occupation: retired  Tobacco Use   Smoking status: Former    Packs/day: 0.25    Types: Cigarettes   Smokeless tobacco: Never   Tobacco comments:    Former smoker. 2 cigarettes/day. Started at age 42.   Vaping Use   Vaping Use: Never used  Substance and Sexual Activity   Alcohol use: No   Drug use: No   Sexual activity: Not on file  Other Topics Concern   Not on file  Social History Narrative   Not on file   Social Determinants of Health   Financial Resource Strain: Low Risk  (12/28/2021)   Overall  Financial Resource Strain (CARDIA)    Difficulty of Paying Living Expenses: Not very hard  Food Insecurity: No Food Insecurity (12/28/2021)   Hunger Vital Sign    Worried About Running Out of Food in the Last Year: Never true    Ran Out of Food in the Last Year: Never true  Transportation Needs: No Transportation Needs (12/28/2021)   PRAPARE - Hydrologist (Medical): No    Lack of Transportation (Non-Medical): No  Physical Activity: Not on file  Stress: Not on file  Social Connections: Not on file     Family History: The patient's family history includes Diabetes in her brother; Heart disease in her brother; Hypertension in her brother, brother, and father; Osteoporosis in her mother.  ROS:   Please see the history of present illness.    Positive for  orthopnea.  No chest pain.  No fevers.  All other systems reviewed and are negative.  EKGs/Labs/Other Studies Reviewed:    The following studies were reviewed today: Echocardiogram was reviewed.  EKG:  The ekg ordered today demonstrates 10/31/2022-atrial fibrillation 80 poor R wave progression  Recent Labs: 12/21/2021: B Natriuretic Peptide 333.8 12/22/2021: ALT 10; TSH 0.449 12/26/2021: Magnesium 2.4 01/25/2022: BUN 7; Creatinine, Ser 0.97; Hemoglobin 13.7; Platelets 208; Potassium 4.3; Sodium 129  Recent Lipid Panel No results found for: "CHOL", "TRIG", "HDL", "CHOLHDL", "VLDL", "LDLCALC", "LDLDIRECT"   Risk Assessment/Calculations:    CHA2DS2-VASc Score = 5   This indicates a 7.2% annual risk of stroke. The patient's score is based upon: CHF History: 1 HTN History: 1 Diabetes History: 0 Stroke History: 0 Vascular Disease History: 0 Age Score: 2 Gender Score: 1              Physical Exam:    VS:  BP (!) 150/90   Pulse 76   Ht '5\' 2"'$  (1.575 m)   Wt 133 lb 3.2 oz (60.4 kg)   SpO2 95%   BMI 24.36 kg/m     Wt Readings from Last 3 Encounters:  10/31/22 133 lb 3.2 oz (60.4 kg)  02/01/22 117 lb 3.2 oz (53.2 kg)  01/25/22 116 lb 2.9 oz (52.7 kg)     GEN:  Well nourished, well developed in no acute distress HEENT: Normal NECK: No JVD; No carotid bruits LYMPHATICS: No lymphadenopathy CARDIAC: IRRR, no murmurs, rubs, gallops RESPIRATORY:  Clear to auscultation without rales, wheezing or rhonchi  ABDOMEN: Soft, non-tender, non-distended MUSCULOSKELETAL:  2+ edema; No deformity  SKIN: Warm and dry NEUROLOGIC:  Alert and oriented x 3 PSYCHIATRIC:  Normal affect   ASSESSMENT:    1. Persistent atrial fibrillation ([place])   2. Hypertension, unspecified type   3. Medication management   4. Acute on chronic diastolic CHF (congestive heart failure) (HCC)    PLAN:    In order of problems listed above:   Persistent atrial fibrillation - CHA2DS2-VASc 5 - Cardioversion  on 01/25/2022.  Back in atrial fibrillation but well rate controlled with heart rate of 80.  May be contributing as well to her symptoms.  If symptoms or not resolved with diuresis, may consider repeat attempt at rhythm control. - Sinus rhythm on Lopressor 50 mg twice a day as well as Eliquis 5 mg twice a day. -Her atrial fibrillation rate at this time does not appear to be tachycardic.  Chronic anticoagulation/secondary hypercoagulable state - CHA2DS2-VASc 5 on Eliquis.  No bleeding.  Snoring witnessed apnea and daytime somnolence - Sleep study  Essential hypertension -  Stable.  Chronic diastolic heart failure - She is up to 133 pounds from 117 pounds back in March.  Some of this certainly could be fluid. - We will go ahead and increase her Lasix from 80 mg daily to 80 mg twice a day.  Her last metabolic profile showed a serum sodium of 129 creatinine of 0.97 and potassium 4.3. - She is on spironolactone 12.5 mg as well as Wilder Glade and metoprolol 50 mg twice a day. -Next week we will have her come back in, assess weight, likely check a basic metabolic profile at that time as well.  Assess the need for further twice daily 80 mg of Lasix.  May be able to go back to 80 once a day at that time.  Obviously if symptoms worsen or become more worrisome she should seek medical attention or go to the hospital for further evaluation.  Valvular heart disease - Moderate to severe mitral regurgitation on prior echocardiogram -Repeat echocardiogram on 02/15/2022 showed only trivial mitral regurgitation.  There was mild mitral stenosis present.          Medication Adjustments/Labs and Tests Ordered: Current medicines are reviewed at length with the patient today.  Concerns regarding medicines are outlined above.  Orders Placed This Encounter  Procedures   CBC   Basic metabolic panel   EKG 30-[facility]   No orders of the defined types were placed in this encounter.

## 2022-11-01 LAB — BASIC METABOLIC PANEL
BUN/Creatinine Ratio: 17 (ref 12–28)
BUN: 14 mg/dL (ref 8–27)
CO2: 26 mmol/L (ref 20–29)
Calcium: 9.4 mg/dL (ref 8.7–10.3)
Chloride: 98 mmol/L (ref 96–106)
Creatinine, Ser: 0.81 mg/dL (ref 0.57–1.00)
Glucose: 100 mg/dL — ABNORMAL HIGH (ref 70–99)
Potassium: 3.7 mmol/L (ref 3.5–5.2)
Sodium: 141 mmol/L (ref 134–144)
eGFR: 75 mL/min/{1.73_m2} (ref >59)

## 2022-11-01 LAB — CBC
Hematocrit: 41.7 % (ref 34.0–46.6)
Hemoglobin: 13.8 g/dL (ref 11.1–15.9)
MCH: 30.6 pg (ref 26.6–33.0)
MCHC: 33.1 g/dL (ref 31.5–35.7)
MCV: 93 fL (ref 79–97)
Platelets: 240 10*3/uL (ref 150–450)
RBC: 4.51 x10E6/uL (ref 3.77–5.28)
RDW: 12.2 % (ref 11.7–15.4)
WBC: 5.4 10*3/uL (ref 3.4–10.8)

## 2022-11-06 ENCOUNTER — Encounter: Payer: Self-pay | Admitting: Cardiovascular Disease

## 2022-11-06 NOTE — Progress Notes (Unsigned)
Cardiology Office Note:    Date:  11/07/2022   ID:  Kristina Martin, DOB May 07, 1946, MRN [id number]  PCP:  Leonard Downing, MD   [place] Prov[facility] Cardiologist:  Mertie Moores, MD     Referring MD: Leonard Downing, *   Chief Complaint  Patient presents with   Atrial Fibrillation   Mitral Regurgitation    History of Present Illness:    Kristina Martin is a 76 y.o. female with a hx of atrial fib, mitral regurgitation , HTN   She has been seen by Dr. Marlou Porch 10/31/22 and also 01/24/22 She was seen last week for worsening dyspnea  She has been cardioverted in the past but was back in atrial fib on 10/31/22 HR was well controlled.  Lasix was increased to 80 mg BID, spiro 12.5 QD and farxiga were continued   The higher dose of lasix has helped with her dyspnea  Very close to feeling normal . Avoids salt .   Is having some balance issues .   Is lighthead Daughter says she is not getting enough water .    Will reduce the lasix to 40 mg a day  with 80 mg on M, Thurs ( she is currently on lasix 80 mg a day )        Past Medical History:  Diagnosis Date   A-fib ([place])    per pt   Fibroid    Hypertension     Past Surgical History:  Procedure Laterality Date   CARDIOVERSION N/A 01/25/2022   Procedure: CARDIOVERSION;  Surgeon: Donato Heinz, MD;  Location: [facility] ENDOSCOPY;  Service: Cardiovascular;  Laterality: N/A;   CESAREAN SECTION     CHOLECYSTECTOMY     DILATION AND CURETTAGE OF UTERUS     HYSTEROSCOPY      Current Medications: Current Meds  Medication Sig   acetaminophen (TYLENOL) 325 MG tablet Take 650-975 mg by mouth every 4 (four) hours as needed (pain).    albuterol (VENTOLIN HFA) 108 (90 Base) MCG/ACT inhaler Inhale 2 puffs into the lungs every 6 (six) hours as needed for shortness of breath.   Calcium Carbonate-Vitamin D (CALCIUM + D PO) Take 1 tablet by mouth daily.    Cholecalciferol (VITAMIN D PO) Take 1 tablet by mouth  daily.    dapagliflozin propanediol (FARXIGA) 10 MG TABS tablet Take 1 tablet (10 mg total) by mouth daily.   ELIQUIS 5 MG TABS tablet Take 5 mg by mouth in the morning and at bedtime.   ferrous sulfate 324 MG TBEC Take 324 mg by mouth daily with breakfast.   furosemide (LASIX) 40 MG tablet Take 2 tablets by mouth ('80mg'$ ) on Mondays and Thursdays, take 1 tablet ('40mg'$ ) daily all other days   metoprolol tartrate (LOPRESSOR) 50 MG tablet Take 1 tablet (50 mg total) by mouth 2 (two) times daily.   potassium chloride SA (KLOR-CON M) 20 MEQ tablet Take 1 tablet (20 mEq total) by mouth daily.   spironolactone (ALDACTONE) 25 MG tablet Take 1/2 tablet (12.5 mg total) by mouth daily.   [DISCONTINUED] furosemide (LASIX) 80 MG tablet Take 80 mg by mouth.     Allergies:   Patient has no known allergies.   Social History   Socioeconomic History   Marital status: Married    Spouse name: [name]   Number of children: 1   Years of education: Not on file   Highest education level: Not on file  Occupational History   Occupation:  retired  Tobacco Use   Smoking status: Former    Packs/day: 0.25    Types: Cigarettes   Smokeless tobacco: Never   Tobacco comments:    Former smoker. 2 cigarettes/day. Started at age 62.   Vaping Use   Vaping Use: Never used  Substance and Sexual Activity   Alcohol use: No   Drug use: No   Sexual activity: Not on file  Other Topics Concern   Not on file  Social History Narrative   Not on file   Social Determinants of Health   Financial Resource Strain: Low Risk  (12/28/2021)   Overall Financial Resource Strain (CARDIA)    Difficulty of Paying Living Expenses: Not very hard  Food Insecurity: No Food Insecurity (12/28/2021)   Hunger Vital Sign    Worried About Running Out of Food in the Last Year: Never true    Ran Out of Food in the Last Year: Never true  Transportation Needs: No Transportation Needs (12/28/2021)   PRAPARE - Hydrologist  (Medical): No    Lack of Transportation (Non-Medical): No  Physical Activity: Not on file  Stress: Not on file  Social Connections: Not on file     Family History: The patient's family history includes Diabetes in her brother; Heart disease in her brother; Hypertension in her brother, brother, and father; Osteoporosis in her mother.  ROS:   Please see the history of present illness.     All other systems reviewed and are negative.  EKGs/Labs/Other Studies Reviewed:    The following studies were reviewed today:   EKG:     Recent Labs: 12/21/2021: B Natriuretic Peptide 333.8 12/22/2021: ALT 10; TSH 0.449 12/26/2021: Magnesium 2.4 10/31/2022: BUN 14; Creatinine, Ser 0.81; Hemoglobin 13.8; Platelets 240; Potassium 3.7; Sodium 141  Recent Lipid Panel No results found for: "CHOL", "TRIG", "HDL", "CHOLHDL", "VLDL", "LDLCALC", "LDLDIRECT"   Risk Assessment/Calculations:                Physical Exam:    VS:  BP (!) 132/58   Pulse 85   Ht '5\' 2"'$  (1.575 m)   Wt 128 lb (58.1 kg)   SpO2 91%   BMI 23.41 kg/m     Wt Readings from Last 3 Encounters:  11/07/22 128 lb (58.1 kg)  10/31/22 133 lb 3.2 oz (60.4 kg)  02/01/22 117 lb 3.2 oz (53.2 kg)     GEN:  frail, elderly female  in no acute distress HEENT: Normal NECK: No JVD; No carotid bruits LYMPHATICS: No lymphadenopath CARDIAC: irreg. Irreg.  RESPIRATORY:  Clear to auscultation without rales, wheezing or rhonchi  ABDOMEN: Soft, non-tender, non-distended MUSCULOSKELETAL:  No edema; No deformity  SKIN: reduced skin turgur .  NEUROLOGIC:  Alert and oriented x 3 PSYCHIATRIC:  Normal affect   ASSESSMENT:    1. Persistent atrial fibrillation ([place])   2. Nonrheumatic mitral valve regurgitation   3. Hypertension, unspecified type   4. Acute on chronic diastolic CHF (congestive heart failure) ([place])   5. Medication management    PLAN:       Atrial fib: She is back in atrial fibrillation.  She is feeling better now that  she has been diuresed.  It may be difficult to keep her out of atrial fibrillation.  I hesitate to start amiodarone since she also has severe lung disease.  2.  Acute on chronic diastolic congestive heart failure: She seems to be volume depleted at this point.  She has increased  skin turgor.  Will reduce her Lasi dose.  Currently she is on Lasix 80 mg a day.  Will reduce the dose to 80 mg a day on Mondays and Thursdays.  She will take 40 mg on the other day.  Check basic metabolic profile again in 2 to 3 weeks. She does not eat excessive salt.  I will see her again in 4 to 6 weeks for follow-up visit.           Medication Adjustments/Labs and Tests Ordered: Current medicines are reviewed at length with the patient today.  Concerns regarding medicines are outlined above.  Orders Placed This Encounter  Procedures   Basic metabolic panel   Basic metabolic panel   Meds ordered this encounter  Medications   furosemide (LASIX) 40 MG tablet    Sig: Take 2 tablets by mouth ('80mg'$ ) on Mondays and Thursdays, take 1 tablet ('40mg'$ ) daily all other days    Dispense:  108 tablet    Refill:  3     Patient Instructions  Medication Instructions:  DECREASE Furosemide to '80mg'$  (2 tablets) on Mondays and Thursdays, take '40mg'$  (1 tablet) all other days *If you need a refill on your cardiac medications before your next appointment, please call your pharmacy*   Lab Work: BMET today, BMET in 2-3 weeks If you have labs (blood work) drawn today and your tests are completely normal, you will receive your results only by: [place] (if you have MyChart) OR A paper copy in the mail If you have any lab test that is abnormal or we need to change your treatment, we will call you to review the results.  Testing/Procedures: NONE  Follow-Up: At [facility], you and your health needs are our priority.  As part of our continuing mission to provide you with exceptional heart care, we have created  designated Provider Care Teams.  These Care Teams include your primary Cardiologist (physician) and Advanced Practice Providers (APPs -  Physician Assistants and Nurse Practitioners) who all work together to provide you with the care you need, when you need it.  Your next appointment:   5 week(s)  The format for your next appointment:   In Person  Provider:   Mertie Moores, MD       Important Information About Sugar         Signed, Mertie Moores, MD  11/07/2022 6:21 PM    [place]

## 2022-11-07 ENCOUNTER — Ambulatory Visit: Payer: PPO | Attending: Cardiovascular Disease | Admitting: Cardiovascular Disease

## 2022-11-07 ENCOUNTER — Encounter: Payer: Self-pay | Admitting: Cardiovascular Disease

## 2022-11-07 VITALS — BP 132/58 | HR 85 | Ht 62.0 in | Wt 128.0 lb

## 2022-11-07 DIAGNOSIS — I4819 Other persistent atrial fibrillation: Secondary | ICD-10-CM

## 2022-11-07 DIAGNOSIS — I340 Nonrheumatic mitral (valve) insufficiency: Secondary | ICD-10-CM | POA: Diagnosis not present

## 2022-11-07 DIAGNOSIS — I5033 Acute on chronic diastolic (congestive) heart failure: Secondary | ICD-10-CM | POA: Diagnosis not present

## 2022-11-07 DIAGNOSIS — Z79899 Other long term (current) drug therapy: Secondary | ICD-10-CM

## 2022-11-07 DIAGNOSIS — I10 Essential (primary) hypertension: Secondary | ICD-10-CM | POA: Diagnosis not present

## 2022-11-07 MED ORDER — FUROSEMIDE 40 MG PO TABS: ORAL_TABLET | ORAL | 3 refills | Status: DC

## 2022-11-07 NOTE — Patient Instructions (Signed)
Medication Instructions:  DECREASE Furosemide to '80mg'$  (2 tablets) on Mondays and Thursdays, take '40mg'$  (1 tablet) all other days *If you need a refill on your cardiac medications before your next appointment, please call your pharmacy*   Lab Work: BMET today, BMET in 2-3 weeks If you have labs (blood work) drawn today and your tests are completely normal, you will receive your results only by: [place] (if you have MyChart) OR A paper copy in the mail If you have any lab test that is abnormal or we need to change your treatment, we will call you to review the results.  Testing/Procedures: NONE  Follow-Up: At [facility], you and your health needs are our priority.  As part of our continuing mission to provide you with exceptional heart care, we have created designated Provider Care Teams.  These Care Teams include your primary Cardiologist (physician) and Advanced Practice Providers (APPs -  Physician Assistants and Nurse Practitioners) who all work together to provide you with the care you need, when you need it.  Your next appointment:   5 week(s)  The format for your next appointment:   In Person  Provider:   Mertie Moores, MD       Important Information About Sugar

## 2022-11-08 LAB — BASIC METABOLIC PANEL
BUN/Creatinine Ratio: 15 (ref 12–28)
BUN: 15 mg/dL (ref 8–27)
CO2: 30 mmol/L — ABNORMAL HIGH (ref 20–29)
Calcium: 10.1 mg/dL (ref 8.7–10.3)
Chloride: 92 mmol/L — ABNORMAL LOW (ref 96–106)
Creatinine, Ser: 0.98 mg/dL (ref 0.57–1.00)
Glucose: 113 mg/dL — ABNORMAL HIGH (ref 70–99)
Potassium: 3.5 mmol/L (ref 3.5–5.2)
Sodium: 138 mmol/L (ref 134–144)
eGFR: 59 mL/min/{1.73_m2} — ABNORMAL LOW (ref >59)

## 2022-11-24 ENCOUNTER — Ambulatory Visit: Payer: PPO | Attending: Cardiovascular Disease

## 2022-11-24 DIAGNOSIS — Z79899 Other long term (current) drug therapy: Secondary | ICD-10-CM

## 2022-11-24 DIAGNOSIS — I4819 Other persistent atrial fibrillation: Secondary | ICD-10-CM

## 2022-11-24 DIAGNOSIS — I10 Essential (primary) hypertension: Secondary | ICD-10-CM

## 2022-11-24 DIAGNOSIS — I5033 Acute on chronic diastolic (congestive) heart failure: Secondary | ICD-10-CM

## 2022-11-24 DIAGNOSIS — I340 Nonrheumatic mitral (valve) insufficiency: Secondary | ICD-10-CM

## 2022-11-24 LAB — BASIC METABOLIC PANEL
BUN/Creatinine Ratio: 10 — ABNORMAL LOW (ref 12–28)
BUN: 10 mg/dL (ref 8–27)
CO2: 28 mmol/L (ref 20–29)
Calcium: 9.6 mg/dL (ref 8.7–10.3)
Chloride: 95 mmol/L — ABNORMAL LOW (ref 96–106)
Creatinine, Ser: 0.96 mg/dL (ref 0.57–1.00)
Glucose: 149 mg/dL — ABNORMAL HIGH (ref 70–99)
Potassium: 3.8 mmol/L (ref 3.5–5.2)
Sodium: 138 mmol/L (ref 134–144)
eGFR: 61 mL/min/{1.73_m2} (ref >59)

## 2022-12-13 ENCOUNTER — Encounter: Payer: Self-pay | Admitting: Cardiovascular Disease

## 2022-12-13 NOTE — Progress Notes (Unsigned)
Cardiology Office Note:    Date:  12/14/2022   ID:  Kristina Martin, DOB 04-26-1946, MRN [id number]  PCP:  Leonard Downing, MD   [place] Prov[facility] Cardiologist:  Mertie Moores, MD     Referring MD: Leonard Downing, *   Chief Complaint  Patient presents with   Atrial Fibrillation    History of Present Illness:    Kristina Martin is a 77 y.o. female with a hx of atrial fib, mitral regurgitation , HTN   She has been seen by Dr. Marlou Porch 10/31/22 and also 01/24/22 She was seen last week for worsening dyspnea  She has been cardioverted in the past but was back in atrial fib on 10/31/21 HR was well controlled.  Lasix was increased to 80 mg BID, spiro 12.5 QD and farxiga were continued   The higher dose of lasix has helped with her dyspnea  Very close to feeling normal . Avoids salt .   Is having some balance issues .   Is lighthead Daughter says she is not getting enough water .    Will reduce the lasix to 40 mg a day  with 80 mg on M, Thurs ( she is currently on lasix 80 mg a day )    December 14, 2022: Kristina Martin is seen today for a follow-up of her congestive heart failure, atrial fibrillation, hypertension. When I saw her several weeks ago I thought that she was volume depleted.  We did reduce the dose of her Lasix.  She continues to have shortness of breath  Has severe COPD  Is having significant DOE  Echo shows normal LV function  Trivial - mild MR      Past Medical History:  Diagnosis Date   A-fib ([place])    per pt   Fibroid    Hypertension     Past Surgical History:  Procedure Laterality Date   CARDIOVERSION N/A 01/25/2022   Procedure: CARDIOVERSION;  Surgeon: Donato Heinz, MD;  Location: [facility] ENDOSCOPY;  Service: Cardiovascular;  Laterality: N/A;   CESAREAN SECTION     CHOLECYSTECTOMY     DILATION AND CURETTAGE OF UTERUS     HYSTEROSCOPY      Current Medications: Current Meds  Medication Sig   acetaminophen (TYLENOL) 325 MG  tablet Take 650-975 mg by mouth every 4 (four) hours as needed (pain).    albuterol (VENTOLIN HFA) 108 (90 Base) MCG/ACT inhaler Inhale 2 puffs into the lungs every 6 (six) hours as needed for shortness of breath.   Calcium Carbonate-Vitamin D (CALCIUM + D PO) Take 1 tablet by mouth daily.    Cholecalciferol (VITAMIN D PO) Take 1 tablet by mouth daily.    dapagliflozin propanediol (FARXIGA) 10 MG TABS tablet Take 1 tablet (10 mg total) by mouth daily.   ELIQUIS 5 MG TABS tablet Take 5 mg by mouth in the morning and at bedtime.   ferrous sulfate 324 MG TBEC Take 324 mg by mouth daily with breakfast.   furosemide (LASIX) 40 MG tablet Take 2 tablets by mouth ('80mg'$ ) on Mondays and Thursdays, take 1 tablet ('40mg'$ ) daily all other days   metoprolol tartrate (LOPRESSOR) 50 MG tablet Take 1 tablet (50 mg total) by mouth 2 (two) times daily.   potassium chloride SA (KLOR-CON M) 20 MEQ tablet Take 1 tablet (20 mEq total) by mouth daily.   spironolactone (ALDACTONE) 25 MG tablet Take 1/2 tablet (12.5 mg total) by mouth daily.     Allergies:  Patient has no known allergies.   Social History   Socioeconomic History   Marital status: Married    Spouse name: [name]   Number of children: 1   Years of education: Not on file   Highest education level: Not on file  Occupational History   Occupation: retired  Tobacco Use   Smoking status: Former    Packs/day: 0.25    Types: Cigarettes   Smokeless tobacco: Never   Tobacco comments:    Former smoker. 2 cigarettes/day. Started at age 29.   Vaping Use   Vaping Use: Never used  Substance and Sexual Activity   Alcohol use: No   Drug use: No   Sexual activity: Not on file  Other Topics Concern   Not on file  Social History Narrative   Not on file   Social Determinants of Health   Financial Resource Strain: Low Risk  (12/28/2021)   Overall Financial Resource Strain (CARDIA)    Difficulty of Paying Living Expenses: Not very hard  Food Insecurity: No  Food Insecurity (12/28/2021)   Hunger Vital Sign    Worried About Running Out of Food in the Last Year: Never true    Ran Out of Food in the Last Year: Never true  Transportation Needs: No Transportation Needs (12/28/2021)   PRAPARE - Hydrologist (Medical): No    Lack of Transportation (Non-Medical): No  Physical Activity: Not on file  Stress: Not on file  Social Connections: Not on file     Family History: The patient's family history includes Diabetes in her brother; Heart disease in her brother; Hypertension in her brother, brother, and father; Osteoporosis in her mother.  ROS:   Please see the history of present illness.     All other systems reviewed and are negative.  EKGs/Labs/Other Studies Reviewed:    The following studies were reviewed today:   EKG:     Recent Labs: 12/21/2021: B Natriuretic Peptide 333.8 12/22/2021: ALT 10; TSH 0.449 12/26/2021: Magnesium 2.4 10/31/2022: Hemoglobin 13.8; Platelets 240 11/24/2022: BUN 10; Creatinine, Ser 0.96; Potassium 3.8; Sodium 138  Recent Lipid Panel No results found for: "CHOL", "TRIG", "HDL", "CHOLHDL", "VLDL", "LDLCALC", "LDLDIRECT"   Risk Assessment/Calculations:                Physical Exam:    Physical Exam: Blood pressure 132/60, pulse 90, height '5\' 2"'$  (1.575 m), weight 137 lb 3.2 oz (62.2 kg), SpO2 93 %.       GEN:  elderly female,  appears older than stated age .  in no acute distress HEENT: Normal NECK: No JVD; No carotid bruits LYMPHATICS: No lymphadenopathy CARDIAC: irreg. Irreg.  RESPIRATORY:  pseudowheezing anteriorly  ABDOMEN: Soft, non-tender, non-distended MUSCULOSKELETAL:  No edema; No deformity  SKIN: Warm and dry NEUROLOGIC:  Alert and oriented x 3    ASSESSMENT:    1. Acute on chronic diastolic CHF (congestive heart failure) ([place])   2. Primary hypertension   3. Nonrheumatic mitral valve regurgitation   4. Persistent atrial fibrillation ([place])   5. Tobacco abuse      PLAN:       Atrial fib: She is back in atrial fibrillation.   Rate is well-controlled.  I do not think that this is contributing to her shortness of breath.  2.  Acute on chronic diastolic congestive heart failure: Her last echocardiogram reveals a well-preserved left ventricular systolic function.  Continue current medications.  3.  Shortness of breath: She has  severe COPD.  I suspect this is the cause of her recent episodes of dyspnea with exertion.  She will return to see her primary medical doctor.  Will see her again in 1 year.            Medication Adjustments/Labs and Tests Ordered: Current medicines are reviewed at length with the patient today.  Concerns regarding medicines are outlined above.  No orders of the defined types were placed in this encounter.  No orders of the defined types were placed in this encounter.    Patient Instructions  Medication Instructions:  Your physician recommends that you continue on your current medications as directed. Please refer to the Current Medication list given to you today.  *If you need a refill on your cardiac medications before your next appointment, please call your pharmacy*   Lab Work: NONE If you have labs (blood work) drawn today and your tests are completely normal, you will receive your results only by: [place] (if you have MyChart) OR A paper copy in the mail If you have any lab test that is abnormal or we need to change your treatment, we will call you to review the results.   Testing/Procedures: NONE   Follow-Up: At [facility], you and your health needs are our priority.  As part of our continuing mission to provide you with exceptional heart care, we have created designated Provider Care Teams.  These Care Teams include your primary Cardiologist (physician) and Advanced Practice Providers (APPs -  Physician Assistants and Nurse Practitioners) who all work together to provide you  with the care you need, when you need it.  We recommend signing up for the patient portal called "MyChart".  Sign up information is provided on this After Visit Summary.  MyChart is used to connect with patients for Virtual Visits (Telemedicine).  Patients are able to view lab/test results, encounter notes, upcoming appointments, etc.  Non-urgent messages can be sent to your provider as well.   To learn more about what you can do with MyChart, go to [email or web address].    Your next appointment:   1 year(s)  Provider:   Mertie Moores, MD        Signed, Mertie Moores, MD  12/14/2022 2:14 PM    [place]

## 2022-12-14 ENCOUNTER — Ambulatory Visit: Payer: PPO | Attending: Cardiovascular Disease | Admitting: Cardiovascular Disease

## 2022-12-14 ENCOUNTER — Encounter: Payer: Self-pay | Admitting: Cardiovascular Disease

## 2022-12-14 VITALS — BP 132/60 | HR 90 | Ht 62.0 in | Wt 137.2 lb

## 2022-12-14 DIAGNOSIS — I10 Essential (primary) hypertension: Secondary | ICD-10-CM | POA: Diagnosis not present

## 2022-12-14 DIAGNOSIS — I4819 Other persistent atrial fibrillation: Secondary | ICD-10-CM | POA: Diagnosis not present

## 2022-12-14 DIAGNOSIS — I340 Nonrheumatic mitral (valve) insufficiency: Secondary | ICD-10-CM

## 2022-12-14 DIAGNOSIS — I5033 Acute on chronic diastolic (congestive) heart failure: Secondary | ICD-10-CM

## 2022-12-14 DIAGNOSIS — Z720 Tobacco use: Secondary | ICD-10-CM

## 2022-12-14 NOTE — Patient Instructions (Signed)
Medication Instructions:  Your physician recommends that you continue on your current medications as directed. Please refer to the Current Medication list given to you today.  *If you need a refill on your cardiac medications before your next appointment, please call your pharmacy*   Lab Work: NONE If you have labs (blood work) drawn today and your tests are completely normal, you will receive your results only by: MyChart Message (if you have MyChart) OR A paper copy in the mail If you have any lab test that is abnormal or we need to change your treatment, we will call you to review the results.   Testing/Procedures: NONE   Follow-Up: At [facility], you and your health needs are our priority.  As part of our continuing mission to provide you with exceptional heart care, we have created designated Provider Care Teams.  These Care Teams include your primary Cardiologist (physician) and Advanced Practice Providers (APPs -  Physician Assistants and Nurse Practitioners) who all work together to provide you with the care you need, when you need it.  We recommend signing up for the patient portal called "MyChart".  Sign up information is provided on this After Visit Summary.  MyChart is used to connect with patients for Virtual Visits (Telemedicine).  Patients are able to view lab/test results, encounter notes, upcoming appointments, etc.  Non-urgent messages can be sent to your provider as well.   To learn more about what you can do with MyChart, go to [email or web address].    Your next appointment:   1 year(s)  Provider:   Philip Nahser, MD   

## 2023-03-03 ENCOUNTER — Telehealth: Payer: Self-pay | Admitting: Cardiovascular Disease

## 2023-03-03 NOTE — Telephone Encounter (Signed)
Returned call to son Kristina Martin who states patient has a few spots on her RLE that are small that ooze a small amount of liquid. He states it's almost like "sweating." PCP evaluated patient, gave antibiotic, completed around a month ago.States there was much improvement, but now seems worse again. Kristina Martin states that his sister was the primary caregiver for his mom, but she passed suddenly about a month ago. The son is now worried that the patient is depressed and seems that her heart condition is worsening. The swelling is with both BLE. States can still get socks and shoes on. Son has multiple complaints that patient's speech has worsened, she is refusing all activity (most of the time)-sleeps "a lot," and has had multiple falls due to balance issues. Confirms that patient is taking Furosemide 80mg  daily. He doesn't feel like in the last month that she has increased sodium intake and states that she actually goes some days without eating at all. He is coaching her to refrain from that and has purchased Boost supplements. Son would like for her to have an appt as soon as possible so that he can get an updated care plan and have her "looked at" to see if any changes need to be done. He's very concerned about her current state.Scheduled with Eligha Bridegroom, NP on 03/07/23. Son appreciative, no further questions.

## 2023-03-03 NOTE — Telephone Encounter (Signed)
Pt c/o swelling: STAT is pt has developed SOB within 24 hours  If swelling, where is the swelling located?   Both legs  How much weight have you gained and in what time span?    Yes  Have you gained 3 pounds in a day or 5 pounds in a week?   2-3 lbs here and there  Do you have a log of your daily weights (if so, list)?   Yes  Are you currently taking a fluid pill?   Yes  Are you currently SOB?   Yes.  Son stated patient had been a smoker but has stopped  Have you traveled recently?  No  Son stated patient's daughter died recently and is concerned patient's leg is leaking fluid.  Son stated patient's legs sometimes get a darker color and are scaly.  Son stated he has been trying to get her home assistance.

## 2023-03-05 NOTE — Progress Notes (Unsigned)
Cardiology Office Note:    Date:  03/08/2023   ID:  Kristina Martin, DOB Sep 22, 1946, MRN [id number]  PCP:  Kristina Mask, MD   [facility] Providers Cardiologist:  Kristina Miss, MD     Referring MD: Kristina Martin, *   Chief Complaint:   History of Present Illness:    Kristina Martin is a  77 y.o. female with a hx of atrial fibrillation, HTN, tobacco use, mitral regurgitation, and COPD.  Initially diagnosed with atrial fibrillation 12/17/2021 at PCP office and started on Eliquis for CHA2DS2-VASc score of 5.  [facility]21 complaining of dry cough for 2 days, bilateral leg swelling for the past week. She was hypoxic with O2 sats in the 80s on presentation, improved with supplemental oxygen via nasal cannula.  BNP was elevated to 333.8.  CXR showed mild cardiomegaly with mild interstitial edema. EKG showed A-fib with RVR rate 144.  Echo showed preserved EF with moderate to severe MR.  During admission, she was rate controlled and diuresed with plan for cardioversion after 3 weeks of anticoagulation.  She admitted to eating hotdogs many times per week.  Felt to have contributed to acute on chronic diastolic CHF.  Seen in A-fib clinic 01/05/2022 with plan for cardioversion.    Seen in cardiology clinic 01/24/2022 for concern of worsening heart failure at the request of PCP. She underwent cardioversion the following day on 01/25/2022.  TTE repeated on 02/15/2022 revealed significant improvement in mitral valve function  with normal LVEF 60 to 65%, no RWMA, indeterminate diastolic parameters, mildly degenerative mitral valve with trivial regurgitation and mild mitral stenosis.  Seen in cardiology clinic 10/31/2022 at which time she was back in A-fib with HR well-controlled at 80 bpm. Reported dyspnea and weight gain, diuretic increased from 80 daily to 80 twice daily.  Was maintained on spironolactone 12.5 mg, Farxiga 10 mg, and metoprolol 50 mg  twice daily.  Advised to return in 1 week for follow-up.  Seen by Dr. Elease Hashimoto in cardiology clinic 11/07/2022 at which time she was feeling improvement in symptoms.  Felt to have some dehydration. Lasix was decreased to 40  mg daily except 80 mg on Mondays and Thursdays.   Seen in follow-up by Dr. Elease Hashimoto on 12/14/2022. Her shortness of breath was felt to be caused by COPD. She was doing well with no specific cardiac concerns and 1 year follow-up was recommended.  Today, she is here for evaluation of leg swelling   Past Medical History:  Diagnosis Date   A-fib    per pt   Fibroid    Hypertension     Past Surgical History:  Procedure Laterality Date   CARDIOVERSION N/A 01/25/2022   Procedure: CARDIOVERSION;  Surgeon: Little Ishikawa, MD;  Location: [facility] ENDOSCOPY;  Service: Cardiovascular;  Laterality: N/A;   CESAREAN SECTION     CHOLECYSTECTOMY     DILATION AND CURETTAGE OF UTERUS     HYSTEROSCOPY      Current Medications: No outpatient medications have been marked as taking for the 03/07/23 encounter (Office Visit) with Kristina Aland, NP.     Allergies:   Patient has no known allergies.   Social History   Socioeconomic History   Marital status: Married    Spouse name: [name]   Number of children: 1   Years of education: Not on file   Highest education level: Not on file  Occupational History   Occupation:  retired  Tobacco Use   Smoking status: Former    Packs/day: .25    Types: Cigarettes   Smokeless tobacco: Never   Tobacco comments:    Former smoker. 2 cigarettes/day. Started at age 48. Quit smoking jan 2022  Vaping Use   Vaping Use: Never used  Substance and Sexual Activity   Alcohol use: No   Drug use: No   Sexual activity: Not on file  Other Topics Concern   Not on file  Social History Narrative   Not on file   Social Determinants of Health   Financial Resource Strain: Low Risk  (12/28/2021)   Overall Financial Resource Strain (CARDIA)     Difficulty of Paying Living Expenses: Not very hard  Food Insecurity: No Food Insecurity (12/28/2021)   Hunger Vital Sign    Worried About Running Out of Food in the Last Year: Never true    Ran Out of Food in the Last Year: Never true  Transportation Needs: No Transportation Needs (12/28/2021)   PRAPARE - Administrator, Civil Service (Medical): No    Lack of Transportation (Non-Medical): No  Physical Activity: Not on file  Stress: Not on file  Social Connections: Not on file     Family History: The patient's family history includes Diabetes in her brother; Heart disease in her brother; Hypertension in her brother, brother, and father; Osteoporosis in her mother.  ROS:   Please see the history of present illness.     All other systems reviewed and are negative.  Labs/Other Studies Reviewed:    The following studies were reviewed today:  Echo 02/15/22  1. Left ventricular ejection fraction, by estimation, is 60 to 65%. Left  ventricular ejection fraction by 3D volume is 63 %. The left ventricle has  normal function. The left ventricle has no regional wall motion  abnormalities. Left ventricular diastolic   parameters are indeterminate.   2. Right ventricular systolic function is normal. The right ventricular  size is normal. There is normal pulmonary artery systolic pressure.   3. The mitral valve is mildly degenerative. Trivial mitral valve  regurgitation. Mild mitral stenosis: Mitral valve area 1.57 cm2 by  continuity equation btut 2.67 cm2 by 2D planimetry.   4. The aortic valve is tricuspid. Aortic valve regurgitation is not  visualized. No aortic stenosis is present.   5. The inferior vena cava is normal in size with greater than 50%  respiratory variability, suggesting right atrial pressure of 3 mmHg.   Comparison(s): Mitrla regurgitation has greatly improved (prior study done  in AF RVR rate 110 bpm).  Echo 12/23/21 1. Left ventricular ejection fraction, by  estimation, is 55 to 60%. The  left ventricle has normal function. The left ventricle has no regional  wall motion abnormalities. There is mild concentric left ventricular  hypertrophy. Left ventricular diastolic  parameters are indeterminate.   2. Right ventricular systolic function is normal. The right ventricular  size is normal. There is moderately elevated pulmonary artery systolic  pressure.   3. Left atrial size was mildly dilated.   4. Right atrial size was moderately dilated.   5. There are multple jets of MR, central and posteriorly directed. .  Moderate to severe mitral valve regurgitation.   6. Tricuspid valve regurgitation is moderate.   7. The aortic valve is tricuspid. Aortic valve regurgitation is not  visualized. Aortic valve sclerosis/calcification is present, without any  evidence of aortic stenosis.   8. The inferior vena cava is  dilated in size with <50% respiratory  variability, suggesting right atrial pressure of 15 mmHg.    Recent Labs: 03/07/2023: ALT 21; B Natriuretic Peptide 609.4; BUN 24; Creatinine, Ser 1.24; Magnesium 1.9; Potassium 3.3; Sodium 136 03/08/2023: Hemoglobin 14.9; Platelets 220  Recent Lipid Panel No results found for: "CHOL", "TRIG", "HDL", "CHOLHDL", "VLDL", "LDLCALC", "LDLDIRECT"   Risk Assessment/Calculations:    CHA2DS2-VASc Score = 5   This indicates a 7.2% annual risk of stroke. The patient's score is based upon: CHF History: 1 HTN History: 1 Diabetes History: 0 Stroke History: 0 Vascular Disease History: 0 Age Score: 2 Gender Score: 1          Physical Exam:    VS:  There were no vitals taken for this visit.    Wt Readings from Last 3 Encounters:  03/07/23 136 lb 11 oz (62 kg)  12/14/22 137 lb 3.2 oz (62.2 kg)  11/07/22 128 lb (58.1 kg)     GEN:  Well nourished, well developed in no acute distress HEENT: Normal NECK: No JVD; No carotid bruits CARDIAC: RRR, no murmurs, rubs, gallops RESPIRATORY:  Clear to  auscultation without rales, wheezing or rhonchi  ABDOMEN: Soft, non-tender, non-distended MUSCULOSKELETAL:  No edema; No deformity.  pedal pulses, bilaterally SKIN: Warm and dry NEUROLOGIC:  Alert and oriented x 3 PSYCHIATRIC:  Normal affect   EKG:  EKG is  ordered today.  The ekg ordered today demonstrates        Diagnoses:    1. Erroneous encounter - disregard    Assessment and Plan:     Persistent atrial fibrillation on chronic anticoagulation:  Chronic HFpEF:  Hypertension:  Mitral valve regurgitation:  COPD:     Disposition:  Medication Adjustments/Labs and Tests Ordered: Current medicines are reviewed at length with the patient today.  Concerns regarding medicines are outlined above.  No orders of the defined types were placed in this encounter.  No orders of the defined types were placed in this encounter.   There are no Patient Instructions on file for this visit.   Signed, Kristina Aland, NP  03/08/2023 8:22 AM    [facility] This encounter was created in error - please disregard.

## 2023-03-07 ENCOUNTER — Emergency Department (HOSPITAL_COMMUNITY): Payer: PPO

## 2023-03-07 ENCOUNTER — Inpatient Hospital Stay (HOSPITAL_COMMUNITY)
Admission: EM | Admit: 2023-03-07 | Discharge: 2023-03-17 | DRG: 291 | Disposition: A | Discharge status: Hospice | Payer: PPO | Attending: Internal Medicine | Admitting: Internal Medicine

## 2023-03-07 ENCOUNTER — Encounter (HOSPITAL_COMMUNITY): Payer: Self-pay

## 2023-03-07 ENCOUNTER — Other Ambulatory Visit: Payer: Self-pay

## 2023-03-07 ENCOUNTER — Ambulatory Visit: Payer: PPO | Admitting: Nurse Practitioner

## 2023-03-07 DIAGNOSIS — F322 Major depressive disorder, single episode, severe without psychotic features: Secondary | ICD-10-CM | POA: Diagnosis present

## 2023-03-07 DIAGNOSIS — I509 Heart failure, unspecified: Secondary | ICD-10-CM

## 2023-03-07 DIAGNOSIS — N2889 Other specified disorders of kidney and ureter: Secondary | ICD-10-CM | POA: Diagnosis present

## 2023-03-07 DIAGNOSIS — Z8249 Family history of ischemic heart disease and other diseases of the circulatory system: Secondary | ICD-10-CM

## 2023-03-07 DIAGNOSIS — R000 Tachycardia, unspecified: Secondary | ICD-10-CM | POA: Diagnosis present

## 2023-03-07 DIAGNOSIS — F17210 Nicotine dependence, cigarettes, uncomplicated: Secondary | ICD-10-CM | POA: Diagnosis present

## 2023-03-07 DIAGNOSIS — I110 Hypertensive heart disease with heart failure: Principal | ICD-10-CM | POA: Diagnosis present

## 2023-03-07 DIAGNOSIS — Z8262 Family history of osteoporosis: Secondary | ICD-10-CM

## 2023-03-07 DIAGNOSIS — E875 Hyperkalemia: Secondary | ICD-10-CM | POA: Diagnosis present

## 2023-03-07 DIAGNOSIS — I071 Rheumatic tricuspid insufficiency: Secondary | ICD-10-CM | POA: Diagnosis present

## 2023-03-07 DIAGNOSIS — Z66 Do not resuscitate: Secondary | ICD-10-CM | POA: Diagnosis not present

## 2023-03-07 DIAGNOSIS — Z597 Insufficient social insurance and welfare support: Secondary | ICD-10-CM

## 2023-03-07 DIAGNOSIS — R4701 Aphasia: Secondary | ICD-10-CM | POA: Diagnosis present

## 2023-03-07 DIAGNOSIS — E279 Disorder of adrenal gland, unspecified: Secondary | ICD-10-CM | POA: Diagnosis present

## 2023-03-07 DIAGNOSIS — E8809 Other disorders of plasma-protein metabolism, not elsewhere classified: Secondary | ICD-10-CM | POA: Diagnosis present

## 2023-03-07 DIAGNOSIS — I4891 Unspecified atrial fibrillation: Secondary | ICD-10-CM | POA: Diagnosis present

## 2023-03-07 DIAGNOSIS — I4819 Other persistent atrial fibrillation: Secondary | ICD-10-CM | POA: Diagnosis present

## 2023-03-07 DIAGNOSIS — K7682 Hepatic encephalopathy: Secondary | ICD-10-CM | POA: Diagnosis present

## 2023-03-07 DIAGNOSIS — I10 Essential (primary) hypertension: Secondary | ICD-10-CM | POA: Diagnosis present

## 2023-03-07 DIAGNOSIS — I2729 Other secondary pulmonary hypertension: Secondary | ICD-10-CM | POA: Diagnosis present

## 2023-03-07 DIAGNOSIS — E871 Hypo-osmolality and hyponatremia: Secondary | ICD-10-CM | POA: Diagnosis present

## 2023-03-07 DIAGNOSIS — Z634 Disappearance and death of family member: Secondary | ICD-10-CM

## 2023-03-07 DIAGNOSIS — I700 Atherosclerosis of aorta: Secondary | ICD-10-CM | POA: Diagnosis present

## 2023-03-07 DIAGNOSIS — R54 Age-related physical debility: Secondary | ICD-10-CM | POA: Diagnosis present

## 2023-03-07 DIAGNOSIS — E876 Hypokalemia: Secondary | ICD-10-CM | POA: Diagnosis present

## 2023-03-07 DIAGNOSIS — Z833 Family history of diabetes mellitus: Secondary | ICD-10-CM

## 2023-03-07 DIAGNOSIS — Z79899 Other long term (current) drug therapy: Secondary | ICD-10-CM

## 2023-03-07 DIAGNOSIS — I5033 Acute on chronic diastolic (congestive) heart failure: Secondary | ICD-10-CM | POA: Diagnosis present

## 2023-03-07 DIAGNOSIS — R600 Localized edema: Secondary | ICD-10-CM

## 2023-03-07 DIAGNOSIS — K7460 Unspecified cirrhosis of liver: Secondary | ICD-10-CM | POA: Diagnosis present

## 2023-03-07 DIAGNOSIS — I445 Left posterior fascicular block: Secondary | ICD-10-CM | POA: Diagnosis present

## 2023-03-07 DIAGNOSIS — Z751 Person awaiting admission to adequate facility elsewhere: Secondary | ICD-10-CM

## 2023-03-07 DIAGNOSIS — Z7901 Long term (current) use of anticoagulants: Secondary | ICD-10-CM

## 2023-03-07 DIAGNOSIS — Z515 Encounter for palliative care: Secondary | ICD-10-CM

## 2023-03-07 DIAGNOSIS — R627 Adult failure to thrive: Secondary | ICD-10-CM | POA: Diagnosis present

## 2023-03-07 DIAGNOSIS — J432 Centrilobular emphysema: Secondary | ICD-10-CM | POA: Diagnosis present

## 2023-03-07 DIAGNOSIS — Z91128 Patient's intentional underdosing of medication regimen for other reason: Secondary | ICD-10-CM

## 2023-03-07 DIAGNOSIS — F419 Anxiety disorder, unspecified: Secondary | ICD-10-CM | POA: Diagnosis present

## 2023-03-07 DIAGNOSIS — Z1152 Encounter for screening for COVID-19: Secondary | ICD-10-CM

## 2023-03-07 DIAGNOSIS — N179 Acute kidney failure, unspecified: Secondary | ICD-10-CM

## 2023-03-07 DIAGNOSIS — L03116 Cellulitis of left lower limb: Secondary | ICD-10-CM | POA: Diagnosis present

## 2023-03-07 DIAGNOSIS — D849 Immunodeficiency, unspecified: Secondary | ICD-10-CM | POA: Diagnosis present

## 2023-03-07 DIAGNOSIS — D6869 Other thrombophilia: Secondary | ICD-10-CM | POA: Diagnosis present

## 2023-03-07 DIAGNOSIS — R7881 Bacteremia: Secondary | ICD-10-CM | POA: Diagnosis present

## 2023-03-07 DIAGNOSIS — I7122 Aneurysm of the aortic arch, without rupture: Secondary | ICD-10-CM | POA: Diagnosis present

## 2023-03-07 DIAGNOSIS — Z6827 Body mass index (BMI) 27.0-27.9, adult: Secondary | ICD-10-CM

## 2023-03-07 DIAGNOSIS — G9340 Encephalopathy, unspecified: Principal | ICD-10-CM

## 2023-03-07 DIAGNOSIS — I5082 Biventricular heart failure: Secondary | ICD-10-CM | POA: Diagnosis present

## 2023-03-07 LAB — COMPREHENSIVE METABOLIC PANEL
ALT: 21 U/L (ref 0–44)
AST: 34 U/L (ref 15–41)
Albumin: 3.2 g/dL — ABNORMAL LOW (ref 3.5–5.0)
Alkaline Phosphatase: 128 U/L — ABNORMAL HIGH (ref 38–126)
Anion gap: 14 (ref 5–15)
BUN: 24 mg/dL — ABNORMAL HIGH (ref 8–23)
CO2: 28 mmol/L (ref 22–32)
Calcium: 9.2 mg/dL (ref 8.9–10.3)
Chloride: 94 mmol/L — ABNORMAL LOW (ref 98–111)
Creatinine, Ser: 1.24 mg/dL — ABNORMAL HIGH (ref 0.44–1.00)
GFR, Estimated: 45 mL/min — ABNORMAL LOW (ref >60)
Glucose, Bld: 100 mg/dL — ABNORMAL HIGH (ref 70–99)
Potassium: 3.3 mmol/L — ABNORMAL LOW (ref 3.5–5.1)
Sodium: 136 mmol/L (ref 135–145)
Total Bilirubin: 4.3 mg/dL — ABNORMAL HIGH (ref 0.3–1.2)
Total Protein: 6.1 g/dL — ABNORMAL LOW (ref 6.5–8.1)

## 2023-03-07 LAB — CBC WITH DIFFERENTIAL/PLATELET
Abs Immature Granulocytes: 0.04 10*3/uL (ref 0.00–0.07)
Basophils Absolute: 0 10*3/uL (ref 0.0–0.1)
Basophils Relative: 0 %
Eosinophils Absolute: 0 10*3/uL (ref 0.0–0.5)
Eosinophils Relative: 0 %
HCT: 46.6 % — ABNORMAL HIGH (ref 36.0–46.0)
Hemoglobin: 15.1 g/dL — ABNORMAL HIGH (ref 12.0–15.0)
Immature Granulocytes: 1 %
Lymphocytes Relative: 26 %
Lymphs Abs: 2.2 10*3/uL (ref 0.7–4.0)
MCH: 29 pg (ref 26.0–34.0)
MCHC: 32.4 g/dL (ref 30.0–36.0)
MCV: 89.4 fL (ref 80.0–100.0)
Monocytes Absolute: 1.5 10*3/uL — ABNORMAL HIGH (ref 0.1–1.0)
Monocytes Relative: 19 %
Neutro Abs: 4.5 10*3/uL (ref 1.7–7.7)
Neutrophils Relative %: 54 %
Platelets: 201 10*3/uL (ref 150–400)
RBC: 5.21 MIL/uL — ABNORMAL HIGH (ref 3.87–5.11)
RDW: 16.1 % — ABNORMAL HIGH (ref 11.5–15.5)
WBC: 8.3 10*3/uL (ref 4.0–10.5)
nRBC: 0 % (ref 0.0–0.2)

## 2023-03-07 LAB — TROPONIN I (HIGH SENSITIVITY)
Troponin I (High Sensitivity): 15 ng/L (ref <18)
Troponin I (High Sensitivity): 20 ng/L — ABNORMAL HIGH (ref <18)

## 2023-03-07 LAB — MAGNESIUM: Magnesium: 1.9 mg/dL (ref 1.7–2.4)

## 2023-03-07 LAB — BRAIN NATRIURETIC PEPTIDE: B Natriuretic Peptide: 609.4 pg/mL — ABNORMAL HIGH (ref 0.0–100.0)

## 2023-03-07 LAB — LIPASE, BLOOD: Lipase: 39 U/L (ref 11–51)

## 2023-03-07 LAB — LACTIC ACID, PLASMA: Lactic Acid, Venous: 2.2 mmol/L (ref 0.5–1.9)

## 2023-03-07 LAB — SARS CORONAVIRUS 2 BY RT PCR: SARS Coronavirus 2 by RT PCR: NEGATIVE

## 2023-03-07 MED ORDER — POTASSIUM CHLORIDE 20 MEQ PO PACK
40.0000 meq | PACK | Freq: Once | ORAL | Status: AC
  Administered 2023-03-07: 40 meq via ORAL
  Filled 2023-03-07: qty 2

## 2023-03-07 MED ORDER — IOHEXOL 350 MG/ML SOLN
60.0000 mL | Freq: Once | INTRAVENOUS | Status: AC | PRN
  Administered 2023-03-07: 60 mL via INTRAVENOUS

## 2023-03-07 MED ORDER — FUROSEMIDE 10 MG/ML IJ SOLN
60.0000 mg | Freq: Once | INTRAMUSCULAR | Status: AC
  Administered 2023-03-07: 60 mg via INTRAVENOUS
  Filled 2023-03-07: qty 6

## 2023-03-07 MED ORDER — POTASSIUM CHLORIDE CRYS ER 20 MEQ PO TBCR: 40.0000 meq | EXTENDED_RELEASE_TABLET | Freq: Once | ORAL | Status: DC

## 2023-03-07 NOTE — ED Provider Notes (Signed)
[facility] [facility] DEPARTMENT AT [facility] Provider Note   CSN: [id number] Arrival date & time: 03/07/23  1550     History {Add pertinent medical, surgical, social history, OB history to HPI:1} Chief Complaint  Patient presents with   Altered Mental Status    Kristina Martin is a 77 y.o. female.  Level 5 caveat secondary to altered mental status.  Patient is brought in by ambulance from home.  At baseline she is usually awake alert is able to ambulate and feed herself.  Son is giving most of the history.  Said over the last months she has declined in her appetite, increased confusion, increased weakness, increased sleeping increased leg swelling.  There was post to go to cardiology appointment today but she was too weak to even get in the car.  Legs have been weeping fluid.  He says she has been confused and now is answering just yes and no questions and even that is sometimes getting wrong.  Maybe is a little more short of breath.  No cough vomiting diarrhea  The history is provided by the patient and the EMS personnel.  Altered Mental Status Presenting symptoms: confusion and lethargy   Severity:  Severe Episode history:  Continuous Duration:  1 month Timing:  Constant Progression:  Worsening Chronicity:  New Associated symptoms: weakness   Associated symptoms: no vomiting        Home Medications Prior to Admission medications   Medication Sig Start Date End Date Taking? Authorizing Provider  acetaminophen (TYLENOL) 325 MG tablet Take 650-975 mg by mouth every 4 (four) hours as needed (pain).     [provider]  albuterol (VENTOLIN HFA) 108 (90 Base) MCG/ACT inhaler Inhale 2 puffs into the lungs every 6 (six) hours as needed for shortness of breath. 08/07/21   [provider]  Calcium Carbonate-Vitamin D (CALCIUM + D PO) Take 1 tablet by mouth daily.     [provider]  Cholecalciferol (VITAMIN D PO) Take 1 tablet by mouth daily.      [provider]  dapagliflozin propanediol (FARXIGA) 10 MG TABS tablet Take 1 tablet (10 mg total) by mouth daily. 12/29/21   Zannie Cove, MD  ELIQUIS 5 MG TABS tablet Take 5 mg by mouth in the morning and at bedtime. 12/17/21   [provider]  ferrous sulfate 324 MG TBEC Take 324 mg by mouth daily with breakfast.    [provider]  furosemide (LASIX) 40 MG tablet Take 2 tablets by mouth ( ) on Mondays and Thursdays, take 1 tablet ( ) daily all other days 11/07/22   Nahser, Deloris Ping, MD  metoprolol tartrate (LOPRESSOR) 50 MG tablet Take 1 tablet (50 mg total) by mouth 2 (two) times daily. 12/28/21   Zannie Cove, MD  potassium chloride SA (KLOR-CON M) 20 MEQ tablet Take 1 tablet (20 mEq total) by mouth daily. 12/29/21   Zannie Cove, MD  spironolactone (ALDACTONE) 25 MG tablet Take 1/2 tablet (12.5 mg total) by mouth daily. 12/29/21   Zannie Cove, MD      Allergies    Patient has no known allergies.    Review of Systems   Review of Systems  Unable to perform ROS: Mental status change  Gastrointestinal:  Negative for vomiting.  Neurological:  Positive for weakness.  Psychiatric/Behavioral:  Positive for confusion.     Physical Exam Updated Vital Signs BP (!) 143/78 (BP Location: Right Arm)   Pulse 90   Temp 98.7 F (37.1  C) (Oral)   Resp 18   SpO2 100%  Physical Exam Vitals and nursing note reviewed.  Constitutional:      General: She is not in acute distress.    Appearance: She is well-developed. She is ill-appearing.  HENT:     Head: Normocephalic and atraumatic.  Eyes:     General:        Right eye: Discharge present.  Cardiovascular:     Rate and Rhythm: Normal rate. Rhythm irregular.     Heart sounds: No murmur heard. Pulmonary:     Effort: Pulmonary effort is normal. No respiratory distress.     Breath sounds: Normal breath sounds.  Abdominal:     Palpations: Abdomen is soft.     Tenderness: There is no abdominal tenderness.  There is no guarding or rebound.     Comments: She has some bruising and a soft tissue mass just above her right hip.  Musculoskeletal:     Cervical back: Neck supple.     Right lower leg: Edema present.     Left lower leg: Edema present.     Comments: She has erythema and warmth of bilateral lower extremities from mid shin down.  She has edema up to her abdomen.  Skin:    General: Skin is warm.     Capillary Refill: Capillary refill takes less than 2 seconds.     Findings: Erythema present.     Comments: Weaping lower extremities bilat  Neurological:     Mental Status: She is disoriented.     Comments: She is awake and will answer simple questions.  She is moving all extremities.  She is somewhat following commands.  She is not oriented to time place situation.  No slurred speech or obvious facial droop.     ED Results / Procedures / Treatments   Labs (all labs ordered are listed, but only abnormal results are displayed) Labs Reviewed  SARS CORONAVIRUS 2 BY RT PCR  COMPREHENSIVE METABOLIC PANEL  LACTIC ACID, PLASMA  LACTIC ACID, PLASMA  BRAIN NATRIURETIC PEPTIDE  CBC WITH DIFFERENTIAL/PLATELET  URINALYSIS, ROUTINE W REFLEX MICROSCOPIC  LIPASE, BLOOD  TROPONIN I (HIGH SENSITIVITY)    EKG None  Radiology No results found.  Procedures Procedures  {Document cardiac monitor, telemetry assessment procedure when appropriate:1}  Medications Ordered in ED Medications - No data to display  ED Course/ Medical Decision Making/ A&P Clinical Course as of 03/07/23 2012  Tue Mar 07, 2023  1738 Chest x-ray interpreted by me as no gross infiltrate.  Awaiting radiology reading. [MB]    Clinical Course User Index [MB] Terrilee Files, MD   {   Click here for ABCD2, HEART and other calculatorsREFRESH Note before signing :1}                          Medical Decision Making Amount and/or Complexity of Data Reviewed Labs: ordered. Radiology: ordered.  Risk Prescription drug  management.   This patient complains of ***; this involves an extensive number of treatment Options and is a complaint that carries with it a high risk of complications and morbidity. The differential includes ***  I ordered, reviewed and interpreted labs, which included *** I ordered medication *** and reviewed PMP when indicated. I ordered imaging studies which included *** and I independently    visualized and interpreted imaging which showed *** Additional history obtained from *** Previous records obtained and reviewed *** I consulted *** and  discussed lab and imaging findings and discussed disposition.  Cardiac monitoring reviewed, *** Social determinants considered, *** Critical Interventions: ***  After the interventions stated above, I reevaluated the patient and found *** Admission and further testing considered, ***   {Document critical care time when appropriate:1} {Document review of labs and clinical decision tools ie heart score, Chads2Vasc2 etc:1}  {Document your independent review of radiology images, and any outside records:1} {Document your discussion with family members, caretakers, and with consultants:1} {Document social determinants of health affecting pt's care:1} {Document your decision making why or why not admission, treatments were needed:1} Final Clinical Impression(s) / ED Diagnoses Final diagnoses:  None    Rx / DC Orders ED Discharge Orders     None

## 2023-03-07 NOTE — ED Notes (Signed)
Patient transported to X-ray 

## 2023-03-07 NOTE — ED Triage Notes (Signed)
Pt present to ED from home with noted altered mental status noted via the family. Family states pt noted with poor appetite and weakness.

## 2023-03-08 ENCOUNTER — Encounter (HOSPITAL_COMMUNITY): Payer: Self-pay | Admitting: Internal Medicine

## 2023-03-08 ENCOUNTER — Inpatient Hospital Stay (HOSPITAL_COMMUNITY): Payer: PPO

## 2023-03-08 DIAGNOSIS — I4891 Unspecified atrial fibrillation: Secondary | ICD-10-CM

## 2023-03-08 DIAGNOSIS — E875 Hyperkalemia: Secondary | ICD-10-CM | POA: Diagnosis present

## 2023-03-08 DIAGNOSIS — R4701 Aphasia: Secondary | ICD-10-CM | POA: Diagnosis present

## 2023-03-08 DIAGNOSIS — D6869 Other thrombophilia: Secondary | ICD-10-CM

## 2023-03-08 DIAGNOSIS — R627 Adult failure to thrive: Secondary | ICD-10-CM | POA: Diagnosis present

## 2023-03-08 DIAGNOSIS — L03116 Cellulitis of left lower limb: Secondary | ICD-10-CM | POA: Diagnosis present

## 2023-03-08 DIAGNOSIS — K7460 Unspecified cirrhosis of liver: Secondary | ICD-10-CM | POA: Diagnosis present

## 2023-03-08 DIAGNOSIS — F322 Major depressive disorder, single episode, severe without psychotic features: Secondary | ICD-10-CM | POA: Diagnosis present

## 2023-03-08 DIAGNOSIS — K760 Fatty (change of) liver, not elsewhere classified: Secondary | ICD-10-CM

## 2023-03-08 DIAGNOSIS — N2889 Other specified disorders of kidney and ureter: Secondary | ICD-10-CM | POA: Diagnosis not present

## 2023-03-08 DIAGNOSIS — E876 Hypokalemia: Secondary | ICD-10-CM | POA: Diagnosis present

## 2023-03-08 DIAGNOSIS — R600 Localized edema: Secondary | ICD-10-CM | POA: Diagnosis present

## 2023-03-08 DIAGNOSIS — K7682 Hepatic encephalopathy: Secondary | ICD-10-CM | POA: Diagnosis present

## 2023-03-08 DIAGNOSIS — I5033 Acute on chronic diastolic (congestive) heart failure: Secondary | ICD-10-CM

## 2023-03-08 DIAGNOSIS — I110 Hypertensive heart disease with heart failure: Secondary | ICD-10-CM | POA: Diagnosis present

## 2023-03-08 DIAGNOSIS — G9340 Encephalopathy, unspecified: Secondary | ICD-10-CM | POA: Diagnosis not present

## 2023-03-08 DIAGNOSIS — Z1152 Encounter for screening for COVID-19: Secondary | ICD-10-CM | POA: Diagnosis not present

## 2023-03-08 DIAGNOSIS — I10 Essential (primary) hypertension: Secondary | ICD-10-CM | POA: Diagnosis not present

## 2023-03-08 DIAGNOSIS — N179 Acute kidney failure, unspecified: Secondary | ICD-10-CM

## 2023-03-08 DIAGNOSIS — I4819 Other persistent atrial fibrillation: Secondary | ICD-10-CM | POA: Diagnosis present

## 2023-03-08 DIAGNOSIS — R7881 Bacteremia: Secondary | ICD-10-CM | POA: Diagnosis present

## 2023-03-08 DIAGNOSIS — I7122 Aneurysm of the aortic arch, without rupture: Secondary | ICD-10-CM | POA: Diagnosis present

## 2023-03-08 DIAGNOSIS — G9341 Metabolic encephalopathy: Secondary | ICD-10-CM

## 2023-03-08 DIAGNOSIS — J432 Centrilobular emphysema: Secondary | ICD-10-CM | POA: Diagnosis present

## 2023-03-08 DIAGNOSIS — D849 Immunodeficiency, unspecified: Secondary | ICD-10-CM | POA: Diagnosis present

## 2023-03-08 DIAGNOSIS — I2729 Other secondary pulmonary hypertension: Secondary | ICD-10-CM | POA: Diagnosis present

## 2023-03-08 DIAGNOSIS — I509 Heart failure, unspecified: Secondary | ICD-10-CM | POA: Diagnosis not present

## 2023-03-08 DIAGNOSIS — E871 Hypo-osmolality and hyponatremia: Secondary | ICD-10-CM | POA: Diagnosis present

## 2023-03-08 DIAGNOSIS — I071 Rheumatic tricuspid insufficiency: Secondary | ICD-10-CM | POA: Diagnosis present

## 2023-03-08 DIAGNOSIS — E8809 Other disorders of plasma-protein metabolism, not elsewhere classified: Secondary | ICD-10-CM | POA: Diagnosis present

## 2023-03-08 DIAGNOSIS — B9689 Other specified bacterial agents as the cause of diseases classified elsewhere: Secondary | ICD-10-CM | POA: Diagnosis not present

## 2023-03-08 DIAGNOSIS — Z515 Encounter for palliative care: Secondary | ICD-10-CM | POA: Diagnosis not present

## 2023-03-08 DIAGNOSIS — Z7189 Other specified counseling: Secondary | ICD-10-CM

## 2023-03-08 DIAGNOSIS — I5082 Biventricular heart failure: Secondary | ICD-10-CM | POA: Diagnosis present

## 2023-03-08 DIAGNOSIS — Z66 Do not resuscitate: Secondary | ICD-10-CM | POA: Diagnosis not present

## 2023-03-08 DIAGNOSIS — E872 Acidosis, unspecified: Secondary | ICD-10-CM | POA: Diagnosis not present

## 2023-03-08 LAB — CBC WITH DIFFERENTIAL/PLATELET
Abs Immature Granulocytes: 0.05 10*3/uL (ref 0.00–0.07)
Basophils Absolute: 0 10*3/uL (ref 0.0–0.1)
Basophils Relative: 0 %
Eosinophils Absolute: 0 10*3/uL (ref 0.0–0.5)
Eosinophils Relative: 0 %
HCT: 44.6 % (ref 36.0–46.0)
Hemoglobin: 14.9 g/dL (ref 12.0–15.0)
Immature Granulocytes: 0 %
Lymphocytes Relative: 14 %
Lymphs Abs: 1.6 10*3/uL (ref 0.7–4.0)
MCH: 29.3 pg (ref 26.0–34.0)
MCHC: 33.4 g/dL (ref 30.0–36.0)
MCV: 87.6 fL (ref 80.0–100.0)
Monocytes Absolute: 2.1 10*3/uL — ABNORMAL HIGH (ref 0.1–1.0)
Monocytes Relative: 18 %
Neutro Abs: 7.4 10*3/uL (ref 1.7–7.7)
Neutrophils Relative %: 68 %
Platelets: 220 10*3/uL (ref 150–400)
RBC: 5.09 MIL/uL (ref 3.87–5.11)
RDW: 16.1 % — ABNORMAL HIGH (ref 11.5–15.5)
WBC: 11.2 10*3/uL — ABNORMAL HIGH (ref 4.0–10.5)
nRBC: 0 % (ref 0.0–0.2)

## 2023-03-08 LAB — COMPREHENSIVE METABOLIC PANEL
ALT: 20 U/L (ref 0–44)
AST: 29 U/L (ref 15–41)
Albumin: 3.2 g/dL — ABNORMAL LOW (ref 3.5–5.0)
Alkaline Phosphatase: 126 U/L (ref 38–126)
Anion gap: 13 (ref 5–15)
BUN: 27 mg/dL — ABNORMAL HIGH (ref 8–23)
CO2: 25 mmol/L (ref 22–32)
Calcium: 8.7 mg/dL — ABNORMAL LOW (ref 8.9–10.3)
Chloride: 97 mmol/L — ABNORMAL LOW (ref 98–111)
Creatinine, Ser: 1.4 mg/dL — ABNORMAL HIGH (ref 0.44–1.00)
GFR, Estimated: 39 mL/min — ABNORMAL LOW (ref >60)
Glucose, Bld: 126 mg/dL — ABNORMAL HIGH (ref 70–99)
Potassium: 4.1 mmol/L (ref 3.5–5.1)
Sodium: 135 mmol/L (ref 135–145)
Total Bilirubin: 4.4 mg/dL — ABNORMAL HIGH (ref 0.3–1.2)
Total Protein: 6.1 g/dL — ABNORMAL LOW (ref 6.5–8.1)

## 2023-03-08 LAB — ECHOCARDIOGRAM COMPLETE
AR max vel: 1.84 cm2
AV Area VTI: 1.99 cm2
AV Area mean vel: 1.75 cm2
AV Mean grad: 4.3 mmHg
AV Peak grad: 7.5 mmHg
Ao pk vel: 1.37 m/s
Area-P 1/2: 4.83 cm2
Calc EF: 63.5 %
Height: 62 in
MV VTI: 2.04 cm2
S' Lateral: 2.5 cm
Single Plane A2C EF: 64.1 %
Single Plane A4C EF: 63.2 %
Weight: 2186.96 oz

## 2023-03-08 LAB — SEDIMENTATION RATE: Sed Rate: 1 mm/hr (ref 0–22)

## 2023-03-08 LAB — URINALYSIS, ROUTINE W REFLEX MICROSCOPIC
Bilirubin Urine: NEGATIVE
Glucose, UA: >=500 mg/dL — AB
Ketones, ur: NEGATIVE mg/dL
Leukocytes,Ua: NEGATIVE
Nitrite: NEGATIVE
Protein, ur: NEGATIVE mg/dL
Specific Gravity, Urine: 1.017 (ref 1.005–1.030)
pH: 5 (ref 5.0–8.0)

## 2023-03-08 LAB — PROCALCITONIN: Procalcitonin: 0.15 ng/mL

## 2023-03-08 LAB — C-REACTIVE PROTEIN: CRP: 2.9 mg/dL — ABNORMAL HIGH (ref <1.0)

## 2023-03-08 LAB — LACTIC ACID, PLASMA: Lactic Acid, Venous: 2.7 mmol/L (ref 0.5–1.9)

## 2023-03-08 LAB — MAGNESIUM: Magnesium: 2.3 mg/dL (ref 1.7–2.4)

## 2023-03-08 MED ORDER — POTASSIUM CHLORIDE 20 MEQ PO PACK
40.0000 meq | PACK | Freq: Once | ORAL | Status: AC
  Administered 2023-03-08: 40 meq via ORAL
  Filled 2023-03-08: qty 2

## 2023-03-08 MED ORDER — FUROSEMIDE 10 MG/ML IJ SOLN
40.0000 mg | Freq: Two times a day (BID) | INTRAMUSCULAR | Status: DC
  Administered 2023-03-08 – 2023-03-09 (×3): 40 mg via INTRAVENOUS
  Filled 2023-03-08 (×3): qty 4

## 2023-03-08 MED ORDER — ONDANSETRON HCL 4 MG/2ML IJ SOLN: 4.0000 mg | Freq: Four times a day (QID) | INTRAMUSCULAR | Status: DC | PRN

## 2023-03-08 MED ORDER — APIXABAN 5 MG PO TABS
5.0000 mg | ORAL_TABLET | Freq: Two times a day (BID) | ORAL | Status: DC
  Administered 2023-03-08 – 2023-03-16 (×17): 5 mg via ORAL
  Filled 2023-03-08 (×17): qty 1

## 2023-03-08 MED ORDER — ACETAMINOPHEN 325 MG PO TABS
650.0000 mg | ORAL_TABLET | Freq: Four times a day (QID) | ORAL | Status: DC | PRN
  Administered 2023-03-09 – 2023-03-12 (×3): 650 mg via ORAL
  Filled 2023-03-08 (×3): qty 2

## 2023-03-08 MED ORDER — ACETAMINOPHEN 650 MG RE SUPP: 650.0000 mg | Freq: Four times a day (QID) | RECTAL | Status: DC | PRN

## 2023-03-08 MED ORDER — MAGNESIUM SULFATE 2 GM/50ML IV SOLN
2.0000 g | Freq: Once | INTRAVENOUS | Status: AC
  Administered 2023-03-08: 2 g via INTRAVENOUS
  Filled 2023-03-08: qty 50

## 2023-03-08 MED ORDER — ONDANSETRON HCL 4 MG PO TABS: 4.0000 mg | ORAL_TABLET | Freq: Four times a day (QID) | ORAL | Status: DC | PRN

## 2023-03-08 MED ORDER — ALBUTEROL SULFATE (2.5 MG/3ML) 0.083% IN NEBU: 2.5000 mg | INHALATION_SOLUTION | RESPIRATORY_TRACT | Status: DC | PRN

## 2023-03-08 MED ORDER — METOPROLOL TARTRATE 50 MG PO TABS
50.0000 mg | ORAL_TABLET | Freq: Two times a day (BID) | ORAL | Status: DC
  Administered 2023-03-08 – 2023-03-14 (×13): 50 mg via ORAL
  Filled 2023-03-08: qty 1
  Filled 2023-03-08: qty 2
  Filled 2023-03-08 (×11): qty 1

## 2023-03-08 MED ORDER — FUROSEMIDE 10 MG/ML IJ SOLN: 40.0000 mg | Freq: Every day | INTRAMUSCULAR | Status: DC

## 2023-03-08 MED ORDER — ALBUTEROL SULFATE HFA 108 (90 BASE) MCG/ACT IN AERS: 2.0000 | INHALATION_SPRAY | Freq: Four times a day (QID) | RESPIRATORY_TRACT | Status: DC | PRN

## 2023-03-08 MED ORDER — DILTIAZEM HCL-DEXTROSE 125-5 MG/125ML-% IV SOLN (PREMIX)
5.0000 mg/h | INTRAVENOUS | Status: DC
  Administered 2023-03-08: 5 mg/h via INTRAVENOUS
  Filled 2023-03-08: qty 125

## 2023-03-08 MED ORDER — METOPROLOL TARTRATE 25 MG PO TABS: 50.0000 mg | ORAL_TABLET | Freq: Two times a day (BID) | ORAL | Status: DC

## 2023-03-08 NOTE — ED Notes (Signed)
Purewick malfunction. Full bed change completed. Purewick re-adjusted.

## 2023-03-08 NOTE — H&P (Addendum)
History and Physical    Kristina Martin:[id number] DOB: 01-27-1946 DOA: 03/07/2023  DOS: the patient was seen and examined on 03/07/2023  PCP: Kaleen Mask, MD   Patient coming from: Home  I have personally briefly reviewed patient's old medical records in [facility]  CC: left leg edema HPI:  77 year old female history of diastolic heart failure last known EF of 60 to 65% in March 2023, hypertension, history of A-fib on Eliquis and Lopressor, presents to the ER today with worsening left leg edema and swelling.  [name] son is at the bedside.  His name is Hilton Sinclair.  He states that his sister (patient's daughter) who is the patient's primary giver died suddenly 2 months ago.  Patient is still suffering from grief.  She is stopped talking normally since the death of her daughter.  Patient lives at home with her elderly husband.  The son does come by episodically to check on her.  He is not sure if the patient's been taking her medications correctly.  Patient has had increasing swelling of her legs.  She is also has started having drainage from the left leg.  Son states that he came over to her house today and noticed a large pool of liquid below her left leg.  Son also states the patient has been altered in mental status and has had poor appetite.  Son states the patient is supposed to be managing her own medications but he is not sure if the patient's been taking her medications correctly.  There is no pillbox at home.  [name] husband is elderly and cannot manage his own medications let alone hers.  On arrival temp 97.8 heart rate 90 blood pressure 143/78 satting 100% on room air.  Weight was recorded at 62 kg.  White count 8.3, hemoglobin 15.1, platelet 201  BNP of 609  Lactic acid of 2.1  Magnesium 1.9  Sodium 136, potassium 3.3, bicarb 28, BUN of 24, creatinine 1.24  UA showed large hemoglobin, rare bacteria, glucose greater than 500.  CT chest  abdomen pelvis with IV contrast showed cardiomegaly with dilatation of the right atrium and reflux of contrast into the [facility] veins compatible with elevated right heart pressures.  There is a 5 mm penetrating atherosclerotic ulcer in the arch of the aorta just distal to the origin of the left subclavian.  There is also 7 mm atherosclerotic ulcer in the posterior descending thoracic aorta.  Patient has a 3.7 x 3.0 x 4.1 right renal mass. Patient has diffuse mesenteric edema and body wall anasarca.    ED Course: BNP 600s, no pulmonary edema on CT chest. Has right renal mass. Legs are erythematous and draining. No leukocytosis.  Review of Systems:  Review of Systems  Unable to perform ROS: Mental acuity    Past Medical History:  Diagnosis Date   A-fib    per pt   Fibroid    Hypertension     Past Surgical History:  Procedure Laterality Date   CARDIOVERSION N/A 01/25/2022   Procedure: CARDIOVERSION;  Surgeon: Little Ishikawa, MD;  Location: [facility] ENDOSCOPY;  Service: Cardiovascular;  Laterality: N/A;   CESAREAN SECTION     CHOLECYSTECTOMY     DILATION AND CURETTAGE OF UTERUS     HYSTEROSCOPY       reports that she has quit smoking. Her smoking use included cigarettes. She smoked an average of .25 packs per day. She has never used smokeless tobacco. She reports that she does  not drink alcohol and does not use drugs.  No Known Allergies  Family History  Problem Relation Age of Onset   Osteoporosis Mother    Hypertension Father    Diabetes Brother    Hypertension Brother    Hypertension Brother    Heart disease Brother     Prior to Admission medications   Medication Sig Start Date End Date Taking? Authorizing Provider  dapagliflozin propanediol (FARXIGA) 10 MG TABS tablet Take 1 tablet (10 mg total) by mouth daily. 12/29/21  Yes Zannie Cove, MD  ELIQUIS 5 MG TABS tablet Take 5 mg by mouth in the morning and at bedtime. 12/17/21  Yes [provider]  furosemide  (LASIX) 40 MG tablet Take 2 tablets by mouth (80mg ) on Mondays and Thursdays, take 1 tablet (40mg ) daily all other days Patient taking differently: Take 40 mg by mouth daily. 11/07/22  Yes Nahser, Deloris Ping, MD  metoprolol tartrate (LOPRESSOR) 50 MG tablet Take 1 tablet (50 mg total) by mouth 2 (two) times daily. 12/28/21  Yes Zannie Cove, MD  spironolactone (ALDACTONE) 25 MG tablet Take 1/2 tablet (12.5 mg total) by mouth daily. 12/29/21  Yes Zannie Cove, MD  acetaminophen (TYLENOL) 325 MG tablet Take 650-975 mg by mouth every 4 (four) hours as needed (pain).     [provider]  albuterol (VENTOLIN HFA) 108 (90 Base) MCG/ACT inhaler Inhale 2 puffs into the lungs every 6 (six) hours as needed for shortness of breath. 08/07/21   [provider]  Calcium Carbonate-Vitamin D (CALCIUM + D PO) Take 1 tablet by mouth daily.     [provider]  Cholecalciferol (VITAMIN D PO) Take 1 tablet by mouth daily.     [provider]  ferrous sulfate 324 MG TBEC Take 324 mg by mouth daily with breakfast.    [provider]  potassium chloride SA (KLOR-CON M) 20 MEQ tablet Take 1 tablet (20 mEq total) by mouth daily. 12/29/21   Zannie Cove, MD    Physical Exam: Vitals:   03/08/23 0030 03/08/23 0045 03/08/23 0100 03/08/23 0115  BP:      Pulse: (!) 115 (!) 120 (!) 119 (!) 117  Resp: 19 20 (!) 22 17  Temp:      TempSrc:      SpO2: 98% 98% 96% 99%  Weight:      Height:        Physical Exam Vitals and nursing note reviewed.  Constitutional:      Comments: Chronically ill-appearing Caucasian female.  No respiratory distress.  HENT:     Head: Normocephalic and atraumatic.     Nose: Nose normal.  Cardiovascular:     Rate and Rhythm: Tachycardia present. Rhythm irregular.     Heart sounds: Murmur heard.  Pulmonary:     Effort: Pulmonary effort is normal. No respiratory distress.     Breath sounds: Normal breath sounds.  Abdominal:     General: Bowel  sounds are normal. There is no distension.  Musculoskeletal:     Right lower leg: 2+ Edema present.     Left lower leg: 2+ Edema present.     Comments: +2 bilateral hard/indurated edema of both lower legs.  Increased erythema of the left leg compared to the right leg.  Multiple excoriations over the left leg. Dried serous crusted drainage on the left foot.  See pictures.  Skin:    Findings: Erythema present.  Neurological:     Mental Status: She is disoriented.  Psychiatric:  Comments: Almost catatonic like behavior.  Intermittently responds to her name.  Speech output is minimal.  Seems to understand commands but has difficulty with verbal communication.           Labs on Admission: I have personally reviewed following labs and imaging studies  CBC: Recent Labs  Lab 03/07/23 1625  WBC 8.3  NEUTROABS 4.5  HGB 15.1*  HCT 46.6*  MCV 89.4  PLT 201   Basic Metabolic Panel: Recent Labs  Lab 03/07/23 1905  NA 136  K 3.3*  CL 94*  CO2 28  GLUCOSE 100*  BUN 24*  CREATININE 1.24*  CALCIUM 9.2  MG 1.9   GFR: Estimated Creatinine Clearance: 32.9 mL/min (A) (by C-G formula based on SCr of 1.24 mg/dL (H)). Liver Function Tests: Recent Labs  Lab 03/07/23 1905  AST 34  ALT 21  ALKPHOS 128*  BILITOT 4.3*  PROT 6.1*  ALBUMIN 3.2*   Recent Labs  Lab 03/07/23 1905  LIPASE 39   Cardiac Enzymes: Recent Labs  Lab 03/07/23 1625 03/07/23 1905  TROPONINIHS 15 20*   BNP (last 3 results) Recent Labs    03/07/23 1625  BNP 609.4*   Urine analysis:    Component Value Date/Time   COLORURINE AMBER (A) 03/08/2023 0002   APPEARANCEUR HAZY (A) 03/08/2023 0002   LABSPEC 1.017 03/08/2023 0002   PHURINE 5.0 03/08/2023 0002   GLUCOSEU >=500 (A) 03/08/2023 0002   HGBUR LARGE (A) 03/08/2023 0002   BILIRUBINUR NEGATIVE 03/08/2023 0002   KETONESUR NEGATIVE 03/08/2023 0002   PROTEINUR NEGATIVE 03/08/2023 0002   UROBILINOGEN 0.2 05/15/2013 1155   NITRITE NEGATIVE  03/08/2023 0002   LEUKOCYTESUR NEGATIVE 03/08/2023 0002    Radiological Exams on Admission: I have personally reviewed images CT CHEST ABDOMEN PELVIS W CONTRAST  Result Date: 03/07/2023 CLINICAL DATA:  Altered mental status and sepsis. Dry cough for 2 days. Bilateral leg swelling. Hypoxic. EXAM: CT CHEST, ABDOMEN, AND PELVIS WITH CONTRAST TECHNIQUE: Multidetector CT imaging of the chest, abdomen and pelvis was performed following the standard protocol during bolus administration of intravenous contrast. RADIATION DOSE REDUCTION: This exam was performed according to the departmental dose-optimization program which includes automated exposure control, adjustment of the mA and/or kV according to patient size and/or use of iterative reconstruction technique. CONTRAST:  60mL OMNIPAQUE IOHEXOL 350 MG/ML SOLN COMPARISON:  Chest radiograph earlier today FINDINGS: CT CHEST FINDINGS Cardiovascular: Cardiomegaly with dilation of the right atrium and reflux of contrast into the hepatic veins compatible with elevated right heart pressures. Mitral annular and aortic valve calcification. Coronary artery and aortic atherosclerotic calcification. 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 Additional 7 mm penetrating atherosclerotic ulcer in the posterior descending thoracic aorta. No aortic aneurysm. No central pulmonary embolism. Mediastinum/Nodes: Unremarkable esophagus.  No thoracic adenopathy. Lungs/Pleura: Advanced emphysema. No focal consolidation, pleural effusion, or pneumothorax. Musculoskeletal: No acute osseous abnormality. CT ABDOMEN PELVIS FINDINGS Hepatobiliary: Hepatic steatosis. Mild nodularity of the hepatic contour. Cholecystectomy. No biliary dilation. Pancreas: Unremarkable. Spleen: Unremarkable. Adrenals/Urinary Tract: Normal adrenal glands. Bilateral cortical renal scarring. Oval heterogenous enhancing lesion in the lower pole of the  right kidney measuring 3.7 x 3.0 x 4.1 cm consistent with neoplasm. No obstructing urinary calculi or hydronephrosis. Unremarkable bladder. Stomach/Bowel: Normal caliber large and small bowel. No bowel wall thickening. The appendix is not definitively visualized. Stomach is within normal limits. Vascular/Lymphatic: Advanced aortic atherosclerotic calcification. Calcified plaque at the origin of the celiac  axis causes severe stenosis (near occlusion). Plaque at the origin of the SMA causes moderate narrowing. Reproductive: No acute abnormality.  No adnexal mass. Other: Diffuse mesenteric edema. Small volume free fluid in the pelvis, left pericolic gutter, and about the spleen. Musculoskeletal: No acute osseous abnormality.  Body wall anasarca. IMPRESSION: 1. Cardiomegaly with dilation of the right atrium and reflux of contrast into the hepatic veins compatible with elevated right heart pressures. 2. Oval heterogenous enhancing lesion in the lower pole of the right kidney measuring up to 4.1 cm consistent with neoplasm. Recommend Urology consultation. 3. Hepatic steatosis with mild nodularity of the hepatic contour suggesting cirrhosis. 4. Diffuse mesenteric edema and small volume free fluid in the abdomen and pelvis. Body wall anasarca. 5. Aortic Atherosclerosis (ICD10-I70.0) and Emphysema (ICD10-J43.9). 6. 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 No aortic aneurysm or dissection. 7. 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 Moderate narrowing at the origin of the SMA. Electronically Signed   By: Minerva Fester M.D.   On: 03/07/2023 21:23   CT Head Wo Contrast  Result Date: 03/07/2023 CLINICAL DATA:  Mental status change, unknown cause EXAM: CT HEAD WITHOUT CONTRAST TECHNIQUE: Contiguous axial images were obtained from the base of the skull through the vertex without intravenous contrast. RADIATION DOSE REDUCTION: This exam was performed  according to the departmental dose-optimization program which includes automated exposure control, adjustment of the mA and/or kV according to patient size and/or use of iterative reconstruction technique. COMPARISON:  CT head 12/14/2018 FINDINGS: Brain: No evidence of large-territorial acute infarction. No parenchymal hemorrhage. No mass lesion. No extra-axial collection. No mass effect or midline shift. No hydrocephalus. Basilar cisterns are patent. Vascular: No hyperdense vessel. Skull: No acute fracture or focal lesion. Sinuses/Orbits: Paranasal sinuses and mastoid air cells are clear. The orbits are unremarkable. Other: None. IMPRESSION: No acute intracranial abnormality. Electronically Signed   By: Tish Frederickson M.D.   On: 03/07/2023 21:19   DG Chest Port 1 View  Result Date: 03/07/2023 CLINICAL DATA:  Altered mental status EXAM: PORTABLE CHEST 1 VIEW COMPARISON:  X-ray 12/27/2021 FINDINGS: Enlarged cardiopericardial silhouette. No consolidation, pneumothorax or effusion. No edema. Hyperinflation. Calcified aorta. Overlapping cardiac leads IMPRESSION: Hyperinflation.  Enlarged cardiopericardial silhouette. Electronically Signed   By: Karen Kays M.D.   On: 03/07/2023 17:51    EKG: My personal interpretation of EKG shows: afib    Assessment/Plan Principal Problem:   Atrial fibrillation with rapid ventricular response Active Problems:   Acute on chronic diastolic CHF (congestive heart failure)   Left leg cellulitis   AKI (acute kidney injury)   Hypertension   Hypokalemia   Secondary hypercoagulable state   Right renal mass   Hyperbilirubinemia    Assessment and Plan: * Atrial fibrillation with rapid ventricular response Admit to cardiac progressive.  Heart rates were sustained in the 120s during interviewing the patient.  Start IV cardizem gtts. Update echo. Continue Eliquis. Keep serum K >4 and Mg >2.0  AKI (acute kidney injury) Likely due to her heart failure.  Avoid nephrotoxic  agents.  Monitor creatinine during diuresis.  Hold [place] for now.  Left leg cellulitis Left > right leg erythema. Both with woody edema and are indurated/erythematous. I think the drainage is serous rather than purulent. Will cover with po keflex for now given no leukocytosis.  Acute on chronic diastolic CHF (congestive heart failure) BNP is elevated to 609, but surprisingly patient has very to no pulmonary edema.  It is all in her legs.  Given the hepatic reflux seen on CT, this seems like this is more right-sided heart failure.  Update echo.  Continue with diuresis but cautiously given that patients with RV failure are extremely preload dependent.  Secondary hypercoagulable state Continue Eliquis due to history of A-fib.  Hypokalemia Replete with oral K.  Keep serum potassium greater than 4.0.  Hypertension Hold Lopressor while on IV Cardizem.  Hyperbilirubinemia Unclear the cause of her hyperbilirubinemia.  LFTs are not elevated.  Right renal mass Will need outpatient workup.   DVT prophylaxis: Eliquis Code Status: Full Code Family Communication: discussed with pt's son Kianah Harries at bedside  Disposition Plan: return home  Consults called: none  Admission status: Inpatient,  progressive   Carollee Herter, DO [facility] 03/08/2023, 1:39 AM

## 2023-03-08 NOTE — Assessment & Plan Note (Addendum)
Continue blood pressure control with metoprolol.  

## 2023-03-08 NOTE — Assessment & Plan Note (Addendum)
Echocardiogram with preserved LV systolic function with EF 60 to 65%, interventricular septum is flattened in systole and diastole, RV with severe enlargement, moderate reduction in RV systolic function, RVSP 52,5 mmHg. RA with severe dilatation, trivial pericardial effusion, severe TR.   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  Urine output is not documented.  Systolic blood pressure is 120 to 117 mmHg.   Continue heart failure management with metoprolol, spironolactone, dapagliflozin. Resume furosemide 20 mg po daily.

## 2023-03-08 NOTE — Assessment & Plan Note (Deleted)
Unclear the cause of her hyperbilirubinemia.  LFTs are not elevated.

## 2023-03-08 NOTE — Assessment & Plan Note (Deleted)
Admit to cardiac progressive.  Heart rates were sustained in the 120s during interviewing the patient.  Start IV cardizem gtts. Update echo. Continue Eliquis. Keep serum K >4 and Mg >2.0

## 2023-03-08 NOTE — Consult Note (Signed)
Consultation Note Date: 03/08/2023   Patient Name: Kristina Martin  DOB: 08-May-1946  MRN: [id number]  Age / Sex: 77 y.o., female  PCP: Kaleen Mask, MD Referring Physician: David Stall, Darin Engels, MD  Reason for Consultation: "end of life"  HPI/Patient Profile: 77 y.o. female  with past medical history of diastolic heart failure last Echo March 2023 with EF of 55-60%, COPD, A-fib, HTN admitted on 03/07/2023 with complaints of progressive weakness, altered mental status, weeping leg edema. Workup revealed a-fib with RVR- started on cardizem drip with rate now improving, lower extremity cellulitis- being treated with antibiotics, heart failure exacerbation- diuresing, and acute kidney injury with Cr at 1.24. CT head with no acute findings. 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 Palliative consult received for "end of life".   Primary Decision Maker NEXT OF KIN - patient is married, however, spouse defers medical decision making to son Georgana Romain  Discussion: I have reviewed medical records including Care Everywhere, progress notes from this and prior admissions, labs and imaging, discussed with RN.  On evaluation patient is lethargic. She is able to answer her name and that she is in the hospital. Unable to discuss her health history or why she is in the hospital. She is unable to participate in GOC discussion.   I introduced Palliative Medicine as specialized medical care for people living with serious illness. It focuses on providing relief from the symptoms and stress of a serious illness. The goal is to improve quality of life for both the patient and the family.  As far as functional and nutritional status - she has ongoing decrease in the  last few months. She has been unable to bathe or clothe herself. Her son has been assisting with her care. She has been depressed since the death of her daughter two months ago. She has had minimal po intake.   We discussed patient's current illness and what it means in the larger context of patient's on-going co-morbidities. I attempted to elicit values and goals of care important to the patient. [name] states his goal of care is for patient's to have increased strength and ability to care for herself.     Advance directives, and concepts specific to code status were discussed. Patient does not have a living will or HCPOA. Son would want for patient to receive CPR. Encouraged patient/family to consider DNR/DNI status understanding evidenced based poor outcomes in similar hospitalized patients, as the cause of the arrest is likely associated with chronic/terminal disease rather than a reversible acute cardio-pulmonary event.    Discussed with patient/family the importance of continued conversation with family and the medical providers regarding overall plan of care and treatment options, ensuring decisions are within the context of the patient's values and GOCs.    Questions and concerns were addressed. The family was encouraged to call with questions or  concerns.   I called patient's spouse after meeting with son. We discussed code status. Spouse does not think that CPR would be helpful for patient and I agree. However, spouse defers decision making to his and patient's son [name].     SUMMARY OF RECOMMENDATIONS -Continue full scope/full code -[name] son is hopeful for improvement in patient's mental status so that she can participate in discussion and possibly complete HCPOA and advance directives -PMT will continue to follow and discuss GOC and advanced care planning    Code Status/Advance Care Planning: Full code   Prognosis:   Unable to determine  Discharge Planning: To Be  Determined  Primary Diagnoses: Present on Admission:  Atrial fibrillation with rapid ventricular response  Acute on chronic diastolic CHF (congestive heart failure)  Hypokalemia  Hypertension  Secondary hypercoagulable state   Review of Systems  Unable to perform ROS: Mental status change    Physical Exam Vitals and nursing note reviewed.  Constitutional:      Appearance: She is ill-appearing.  Skin:    Coloration: Skin is jaundiced.  Neurological:     Comments: lethargic     Vital Signs: BP (!) 115/58   Pulse 63   Temp 99 F (37.2 C) (Oral)   Resp 16   Ht  (1.575 m)   Wt 62 kg   SpO2 97%   BMI 25.00 kg/m  Pain Scale: 0-10   Pain Score: 0-No pain   SpO2: SpO2: 97 % O2 Device:SpO2: 97 % O2 Flow Rate: .   IO: Intake/output summary:  Intake/Output Summary (Last 24 hours) at 03/08/2023 1247 Last data filed at 03/08/2023 0258 Gross per 24 hour  Intake 50 ml  Output --  Net 50 ml    LBM:   Baseline Weight: Weight: 62 kg Most recent weight: Weight: 62 kg       Thank you for this consult. Palliative medicine will continue to follow and assist as needed.   Greater than 50%  of this time was spent counseling and coordinating care related to the above assessment and plan.  Signed by: Ocie Bob, AGNP-C Palliative Medicine    Please contact Palliative Medicine Team phone at [phone] for questions and concerns.  For individual provider: See Loretha Stapler

## 2023-03-08 NOTE — Assessment & Plan Note (Deleted)
Continue Eliquis due to history of A-fib.

## 2023-03-08 NOTE — Assessment & Plan Note (Addendum)
Patient has been on ceftriaxone, will transition to oral cephalexin. Patient with no fever or leg pain.  Wraps in place today, but noted erythema to be improving.

## 2023-03-08 NOTE — Assessment & Plan Note (Deleted)
Replete with oral K.  Keep serum potassium greater than 4.0.

## 2023-03-08 NOTE — Progress Notes (Signed)
Met Johnny Bridge (patient's sister) at bedside. Patient is alert and oriented to person, place and time but not to situation. Johnny Bridge mentioned that patient had stroke (not on file) in the past. After the stroke, she noticed that patient had difficulty expressing her thoughts. She also added that patient usually says "Yes". Johnny Bridge also left her number [phone].

## 2023-03-08 NOTE — Subjective & Objective (Addendum)
CC: left leg edema HPI:  77 year old female history of diastolic heart failure last known EF of 60 to 65% in March 2023, hypertension, history of A-fib on Eliquis and Lopressor, presents to the ER today with worsening left leg edema and swelling.  [name] son is at the bedside.  His name is Hilton Sinclair.  He states that his sister (patient's daughter) who is the patient's primary giver died suddenly 2 months ago.  Patient is still suffering from grief.  She is stopped talking normally since the death of her daughter.  Patient lives at home with her elderly husband.  The son does come by episodically to check on her.  He is not sure if the patient's been taking her medications correctly.  Patient has had increasing swelling of her legs.  She is also has started having drainage from the left leg.  Son states that he came over to her house today and noticed a large pool of liquid below her left leg.  Son also states the patient has been altered in mental status and has had poor appetite.  Son states the patient is supposed to be managing her own medications but he is not sure if the patient's been taking her medications correctly.  There is no pillbox at home.  [name] husband is elderly and cannot manage his own medications let alone hers.  On arrival temp 97.8 heart rate 90 blood pressure 143/78 satting 100% on room air.  Weight was recorded at 62 kg.  White count 8.3, hemoglobin 15.1, platelet 201  BNP of 609  Lactic acid of 2.1  Magnesium 1.9  Sodium 136, potassium 3.3, bicarb 28, BUN of 24, creatinine 1.24  UA showed large hemoglobin, rare bacteria, glucose greater than 500.  CT chest abdomen pelvis with IV contrast showed cardiomegaly with dilatation of the right atrium and reflux of contrast into the [facility] veins compatible with elevated right heart pressures.  There is a 5 mm penetrating atherosclerotic ulcer in the arch of the aorta just distal to the origin of the left  subclavian.  There is also 7 mm atherosclerotic ulcer in the posterior descending thoracic aorta.  Patient has a 3.7 x 3.0 x 4.1 right renal mass. Patient has diffuse mesenteric edema and body wall anasarca.

## 2023-03-08 NOTE — ED Notes (Signed)
Please call with update [phone]

## 2023-03-08 NOTE — Assessment & Plan Note (Addendum)
I spoke with her son about this incidental finding, will need outpatient follow up with Urology.

## 2023-03-08 NOTE — ED Notes (Signed)
Labs sent on patient. Patient alert oriented,cooperative. Vitals stable. No distress. Call light in reach, bed in lowest position and locked. Updated on care plan.  

## 2023-03-08 NOTE — Consult Note (Signed)
WOC Nurse Consult Note: Reason for Consult:bilateral LEs with erythema, edema. LLE with areas of weeping, dried serous exudate between digits. Photos provided by Provider upon admission. Wound type:cellulitis Pressure Injury POA: N/A Measurement:N/A, pin point areas of weeping on LLE Wound ZOX:[id number] areas of weeping, pink, moist Drainage (amount, consistency, odor) serous Periwound:erythema, edema Dressing procedure/placement/frequency:I have provided guidance for nursing in the care of the LE lesions using a soap and water cleanse (particularly to the digits), rinse and gentle, but thorough drying. Topical care will be to place a single layer of xeroform gauze (antimicrobial, nonadherent) over the areas of weeping, then to top with and ABD pad and secure with Kerlix roll gauze wrapped from just below toes to just below knee. The Kerlix is to be topped with a 6-inch ACE bandage applied in a similar manner. Feet are to be placed into Prevalon boots for elevation and PI prevention. A sacral silicone foam is to be placed for PI prevention in this area.A pressure redistribution chair cushion is provided for in house and downstream use as LE elevation will remain a part of the POC and this increases pressure on the sacral and ischial tuberosities when sitting. Please send with patient at discharge.  WOC nursing team will not follow, but will remain available to this patient, the nursing and medical teams.  Please re-consult if needed.  Thank you for inviting [place] to participate in this patient's Plan of Care.  Ladona Mow, MSN, RN, CNS, GNP, Leda Min, [employer], [employer] phone:  [phone]

## 2023-03-08 NOTE — Progress Notes (Signed)
[facility] PROGRESS NOTE    Progress Note  [name]  [name] DOB: 1946/07/20 DOA: 03/07/2023 PCP: Kaleen Mask, MD     Brief Narrative:   Kristina Martin is an 77 y.o. female past medical history of diastolic heart failure with an EF of 60% in March 2023, past medical history of heavy smoking not on oxygen at home, essential hypertension history of A-fib on Eliquis comes in with worsening lower extremity edema and drainage from the left lower extremity, patient has been noncompliant with his medication.  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 Continue Eliquis monitor electrolytes to keep potassium greater 4 magnesium greater than 2. Now heart rate is controlled continue IV diltiazem drip, will start her home dose metoprolol overnight for 2 hours and wean off diltiazem.  Acute on chronic diastolic CHF (congestive heart failure) BNP of 600, CT showed cardiomegaly.  Previous 2D echo back in 02/15/2022 showed a degenerative valve with mild mitral stenosis. Repeated 2D echo today is pending. Awaiting increase Lasix IV twice daily check basic metabolic panel critical potassium greater than 4. Monitor strict I's and O's and daily weights.  I's and O's have been poorly recorded today.  Acute kidney injury: With a baseline creatinine of less than 1 on admission 1.2 likely due to fluid overload. Hopefully this will improve with IV diuresis.  Acute metabolic encephalopathy: Unclear etiology question due to infectious  etiology, CT of the head was unremarkable. Her son relates that there has been a sudden change in her mental state, her recent caregiver died about 2 months ago she has a history of smoking, significant hypertension and new infectious etiology. Nonfocal on physical exam.  Left lower extremity cellulitis: He has woody edema on physical exam erythematous warm to touch serosanguineous drainage on the left more than on the right. Started on p.o. Keflex Remained afebrile but had mild leukocytosis on admission 11.2. Blood cultures were sent on 03/06/2022.  Anticoagulation: Continue Eliquis for A-fib.  Hypokalemia: He was placed on IV Lasix will replete orally again basic metabolic panels pending. Will repeat tomorrow morning.  Essential hypertension: Her Lopressor was held on admission as she was started on IV diltiazem. Heart rate is improved today.  Hyperbilirubinemia: At 4.0 with normal LFTs borderline elevated alkaline phosphatase repeat tomorrow morning. Check hepatic function panel tomorrow.  Right renal mass: Will need follow-up as an outpatient.  Goals of care: Will need to get palliative care involved.   DVT prophylaxis: lovenox Family Communication:Son Status is: Inpatient Remains inpatient appropriate because: Acute decompensated diastolic heart failure and left lower extremity cellulitis    Code Status:     Code Status Orders  (From admission, onward)           Start     Ordered   03/08/23 0137  Full code  Continuous       Question:  By:  Answer:  Consent: discussion documented in EHR   03/08/23 0137           Code Status History  Date Active Date Inactive Code Status Order ID Comments User Context   03/08/2023 0116 03/08/2023 0137 Full Code [id number]  Carollee Herter, DO ED   12/22/2021 1350 12/28/2021 1933 Full Code [id number]  Orland Mustard, MD Inpatient         IV Access:   Peripheral IV   Procedures and diagnostic studies:   CT CHEST  ABDOMEN PELVIS W CONTRAST  Result Date: 03/07/2023 CLINICAL DATA:  Altered mental status and sepsis. Dry cough for 2 days. Bilateral leg swelling. Hypoxic. EXAM: CT CHEST, ABDOMEN, AND PELVIS WITH CONTRAST TECHNIQUE: Multidetector CT imaging of the chest, abdomen and pelvis was performed following the standard protocol during bolus administration of intravenous contrast. RADIATION DOSE REDUCTION: This exam was performed according to the departmental dose-optimization program which includes automated exposure control, adjustment of the mA and/or kV according to patient size and/or use of iterative reconstruction technique. CONTRAST:  60mL OMNIPAQUE IOHEXOL 350 MG/ML SOLN COMPARISON:  Chest radiograph earlier today FINDINGS: CT CHEST FINDINGS Cardiovascular: Cardiomegaly with dilation of the right atrium and reflux of contrast into the hepatic veins compatible with elevated right heart pressures. Mitral annular and aortic valve calcification. Coronary artery and aortic atherosclerotic calcification. 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 Additional 7 mm penetrating atherosclerotic ulcer in the posterior descending thoracic aorta. No aortic aneurysm. No central pulmonary embolism. Mediastinum/Nodes: Unremarkable esophagus.  No thoracic adenopathy. Lungs/Pleura: Advanced emphysema. No focal consolidation, pleural effusion, or pneumothorax. Musculoskeletal: No acute osseous abnormality. CT ABDOMEN PELVIS FINDINGS Hepatobiliary: Hepatic steatosis. Mild nodularity of the hepatic contour. Cholecystectomy. No biliary dilation. Pancreas: Unremarkable. Spleen: Unremarkable. Adrenals/Urinary Tract: Normal adrenal glands. Bilateral cortical renal scarring. Oval heterogenous enhancing lesion in the lower pole of the right kidney measuring 3.7 x 3.0 x 4.1 cm consistent with neoplasm. No obstructing urinary calculi or hydronephrosis. Unremarkable bladder.  Stomach/Bowel: Normal caliber large and small bowel. No bowel wall thickening. The appendix is not definitively visualized. Stomach is within normal limits. Vascular/Lymphatic: Advanced aortic atherosclerotic calcification. 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 Plaque at the origin of the SMA causes moderate narrowing. Reproductive: No acute abnormality.  No adnexal mass. Other: Diffuse mesenteric edema. Small volume free fluid in the pelvis, left pericolic gutter, and about the spleen. Musculoskeletal: No acute osseous abnormality.  Body wall anasarca. IMPRESSION: 1. Cardiomegaly with dilation of the right atrium and reflux of contrast into the hepatic veins compatible with elevated right heart pressures. 2. Oval heterogenous enhancing lesion in the lower pole of the right kidney measuring up to 4.1 cm consistent with neoplasm. Recommend Urology consultation. 3. Hepatic steatosis with mild nodularity of the hepatic contour suggesting cirrhosis. 4. Diffuse mesenteric edema and small volume free fluid in the abdomen and pelvis. Body wall anasarca. 5. Aortic Atherosclerosis (ICD10-I70.0) and Emphysema (ICD10-J43.9). 6. 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 No aortic aneurysm or dissection. 7. 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 Moderate narrowing at the origin of the SMA. Electronically Signed   By: Minerva Fester M.D.   On: 03/07/2023 21:23   CT Head Wo Contrast  Result Date: 03/07/2023 CLINICAL DATA:  Mental status change, unknown cause EXAM: CT HEAD WITHOUT CONTRAST TECHNIQUE: Contiguous axial images were obtained from the base of the skull through the vertex without intravenous contrast. RADIATION DOSE REDUCTION: This exam was performed according to the departmental dose-optimization program  which includes automated exposure control, adjustment of the mA and/or kV according to  patient size and/or use of iterative reconstruction technique. COMPARISON:  CT head 12/14/2018 FINDINGS: Brain: No evidence of large-territorial acute infarction. No parenchymal hemorrhage. No mass lesion. No extra-axial collection. No mass effect or midline shift. No hydrocephalus. Basilar cisterns are patent. Vascular: No hyperdense vessel. Skull: No acute fracture or focal lesion. Sinuses/Orbits: Paranasal sinuses and mastoid air cells are clear. The orbits are unremarkable. Other: None. IMPRESSION: No acute intracranial abnormality. Electronically Signed   By: Tish Frederickson M.D.   On: 03/07/2023 21:19   DG Chest Port 1 View  Result Date: 03/07/2023 CLINICAL DATA:  Altered mental status EXAM: PORTABLE CHEST 1 VIEW COMPARISON:  X-ray 12/27/2021 FINDINGS: Enlarged cardiopericardial silhouette. No consolidation, pneumothorax or effusion. No edema. Hyperinflation. Calcified aorta. Overlapping cardiac leads IMPRESSION: Hyperinflation.  Enlarged cardiopericardial silhouette. Electronically Signed   By: Karen Kays M.D.   On: 03/07/2023 17:51     Medical Consultants:   None.   Subjective:    Kristina Martin no complaints confused this morning  Objective:    Vitals:   03/08/23 0500 03/08/23 0600 03/08/23 0630 03/08/23 0704  BP: 127/67 (!) 128/104 (!) 102/59   Pulse: 96 90 86   Resp: 18 18 (!) 22   Temp:    99.1 F (37.3 C)  TempSrc:    Oral  SpO2: 97% 91% 98%   Weight:      Height:       SpO2: 98 %   Intake/Output Summary (Last 24 hours) at 03/08/2023 0716 Last data filed at 03/08/2023 0258 Gross per 24 hour  Intake 50 ml  Output --  Net 50 ml   Filed Weights   03/07/23 1613  Weight: 62 kg    Exam: General exam: In no acute distress. Respiratory system: Good air movement and crackles at bases Cardiovascular system: S1 & S2 heard, RRR.  Positive JVD Gastrointestinal system: Abdomen is nondistended, soft and nontender.  Central nervous system: Alert and oriented to person  nonfocal on physical exam Extremities: 3+ edema bilaterally Skin: Warm to touch and tender erythematous and crusting in between her digits of her fingers bilaterally Psychiatry: No judgment and insight of medical condition.   Data Reviewed:    Labs: Basic Metabolic Panel: Recent Labs  Lab 03/07/23 1905  NA 136  K 3.3*  CL 94*  CO2 28  GLUCOSE 100*  BUN 24*  CREATININE 1.24*  CALCIUM 9.2  MG 1.9   GFR Estimated Creatinine Clearance: 32.9 mL/min (A) (by C-G formula based on SCr of 1.24 mg/dL (H)). Liver Function Tests: Recent Labs  Lab 03/07/23 1905  AST 34  ALT 21  ALKPHOS 128*  BILITOT 4.3*  PROT 6.1*  ALBUMIN 3.2*   Recent Labs  Lab 03/07/23 1905  LIPASE 39   No results for input(s): "AMMONIA" in the last 168 hours. Coagulation profile No results for input(s): "INR", "PROTIME" in the last 168 hours. COVID-19 Labs  Recent Labs    03/08/23 0453  CRP 2.9*    Lab Results  Component Value Date   SARSCOV2NAA NEGATIVE 03/07/2023   SARSCOV2NAA NEGATIVE 12/21/2021    CBC: Recent Labs  Lab 03/07/23 1625 03/08/23 0453  WBC 8.3 11.2*  NEUTROABS 4.5 7.4  HGB 15.1* 14.9  HCT 46.6* 44.6  MCV 89.4 87.6  PLT 201 220   Cardiac Enzymes: No results for input(s): "CKTOTAL", "CKMB", "CKMBINDEX", "TROPONINI" in the last 168 hours. BNP (last 3 results)  No results for input(s): "PROBNP" in the last 8760 hours. CBG: No results for input(s): "GLUCAP" in the last 168 hours. D-Dimer: No results for input(s): "DDIMER" in the last 72 hours. Hgb A1c: No results for input(s): "HGBA1C" in the last 72 hours. Lipid Profile: No results for input(s): "CHOL", "HDL", "LDLCALC", "TRIG", "CHOLHDL", "LDLDIRECT" in the last 72 hours. Thyroid function studies: No results for input(s): "TSH", "T4TOTAL", "T3FREE", "THYROIDAB" in the last 72 hours.  Invalid input(s): "FREET3" Anemia work up: No results for input(s): "VITAMINB12", "FOLATE", "FERRITIN", "TIBC", "IRON",  "RETICCTPCT" in the last 72 hours. Sepsis Labs: Recent Labs  Lab 03/07/23 1625 03/08/23 0453  WBC 8.3 11.2*  LATICACIDVEN 2.2*  --    Microbiology Recent Results (from the past 240 hour(s))  SARS Coronavirus 2 by RT PCR (hospital order, performed in [facility] hospital lab) *cepheid single result test* Anterior Nasal Swab     Status: None   Collection Time: 03/07/23  4:10 PM   Specimen: Anterior Nasal Swab  Result Value Ref Range Status   SARS Coronavirus 2 by RT PCR NEGATIVE NEGATIVE Final    Comment: Performed at [facility], [address], [place], [place] [address]  Culture, blood (routine x 2)     Status: None (Preliminary result)   Collection Time: 03/07/23  4:11 PM   Specimen: BLOOD  Result Value Ref Range Status   Specimen Description BLOOD RIGHT ANTECUBITAL  Final   Special Requests   Final    BOTTLES DRAWN AEROBIC AND ANAEROBIC Blood Culture results may not be optimal due to an inadequate volume of blood received in culture bottles   Culture   Final    NO GROWTH < 24 HOURS Performed at [facility], [address], [place], [place] [address]    Report Status PENDING  Incomplete  Culture, blood (routine x 2)     Status: None (Preliminary result)   Collection Time: 03/07/23  4:16 PM   Specimen: BLOOD  Result Value Ref Range Status   Specimen Description BLOOD SITE NOT SPECIFIED  Final   Special Requests   Final    BOTTLES DRAWN AEROBIC AND ANAEROBIC Blood Culture results may not be optimal due to an inadequate volume of blood received in culture bottles   Culture   Final    NO GROWTH < 24 HOURS Performed at [facility], [address] [place], [place] [address]    Report Status PENDING  Incomplete     Medications:    apixaban  5 mg Oral BID   furosemide  40 mg Intravenous Daily   Continuous Infusions:  diltiazem (CARDIZEM) infusion 10 mg/hr (03/08/23 0715)      LOS: 0 days   Marinda Elk  [facility]  03/08/2023,  7:16 AM

## 2023-03-08 NOTE — Assessment & Plan Note (Addendum)
Hypo-Hyperkalemia and hyponatremia.   At the time of her discharge her serum cr is 1,0 with K at 5,3 and serum bicarbonate at 29. Na 137 and BUN 29.Marland Kitchen   Patient will get sodium zirconium one dose of 10 g and follow up K this pm. If K improved, patient will be transferred to SNF. Continue diuresis with furosemide and empagliflozin.  Follow up renal function and electrolytes in 7 days as outpatient.

## 2023-03-08 NOTE — ED Notes (Signed)
ED TO INPATIENT HANDOFF REPORT  ED Nurse Name and Phone #: Delice Bison, RN  S Name/Age/Gender Kristina Martin 77 y.o. female Room/Bed: 041C/041C  Code Status   Code Status: Full Code  Home/SNF/Other Home Patient oriented to: self, place, time, and situation Is this baseline? Yes   Triage Complete: Triage complete  Chief Complaint Atrial fibrillation with rapid ventricular response [I48.91]  Triage Note Pt present to ED from home with noted altered mental status noted via the family. Family states pt noted with poor appetite and weakness.   Allergies No Known Allergies  Level of Care/Admitting Diagnosis ED Disposition     ED Disposition  Admit   Condition  --   Comment  Hospital Area: [facility] [[id number]]  Level of Care: Progressive [102]  Admit to Progressive based on following criteria: CARDIOVASCULAR & THORACIC of moderate stability with acute coronary syndrome symptoms/low risk myocardial infarction/hypertensive urgency/arrhythmias/heart failure potentially compromising stability and stable post cardiovascular intervention patients.  May admit patient to [facility] or [facility] if equivalent level of care is available:: No  Covid Evaluation: Asymptomatic - no recent exposure (last 10 days) testing not required  Diagnosis: Atrial fibrillation with rapid ventricular response [161096]  Admitting Physician: Imogene Burn, ERIC [[id number]]  Attending Physician: Imogene Burn, ERIC [[id number]]  Certification:: I certify this patient will need inpatient services for at least 2 midnights  Estimated Length of Stay: 4          B Medical/Surgery History Past Medical History:  Diagnosis Date   A-fib    per pt   Fibroid    Hypertension    Past Surgical History:  Procedure Laterality Date   CARDIOVERSION N/A 01/25/2022   Procedure: CARDIOVERSION;  Surgeon: Little Ishikawa, MD;  Location: [facility] ENDOSCOPY;  Service: Cardiovascular;  Laterality: N/A;   CESAREAN SECTION      CHOLECYSTECTOMY     DILATION AND CURETTAGE OF UTERUS     HYSTEROSCOPY       A IV Location/Drains/Wounds Patient Lines/Drains/Airways Status     Active Line/Drains/Airways     Name Placement date Placement time Site Days   Peripheral IV 03/07/23 20 G 1" Right Antecubital 03/07/23  1722  Antecubital  1   Peripheral IV 03/07/23 22 G 1" Posterior;Right Hand 03/07/23  1723  Hand  1   Peripheral IV 03/07/23 20 G 1" Left Antecubital 03/07/23  1723  Antecubital  1            Intake/Output Last 24 hours  Intake/Output Summary (Last 24 hours) at 03/08/2023 1506 Last data filed at 03/08/2023 0258 Gross per 24 hour  Intake 50 ml  Output --  Net 50 ml    Labs/Imaging Results for orders placed or performed during the hospital encounter of 03/07/23 (from the past 48 hour(s))  SARS Coronavirus 2 by RT PCR (hospital order, performed in [facility] hospital lab) *cepheid single result test* Anterior Nasal Swab     Status: None   Collection Time: 03/07/23  4:10 PM   Specimen: Anterior Nasal Swab  Result Value Ref Range   SARS Coronavirus 2 by RT PCR NEGATIVE NEGATIVE    Comment: Performed at [facility], [address], [place], [place] [address]  Culture, blood (routine x 2)     Status: None (Preliminary result)   Collection Time: 03/07/23  4:11 PM   Specimen: BLOOD  Result Value Ref Range   Specimen Description BLOOD RIGHT ANTECUBITAL    Special Requests  BOTTLES DRAWN AEROBIC AND ANAEROBIC Blood Culture results may not be optimal due to an inadequate volume of blood received in culture bottles   Culture      NO GROWTH < 24 HOURS Performed at [facility], [address] [place], [place] [address]    Report Status PENDING   Culture, blood (routine x 2)     Status: None (Preliminary result)   Collection Time: 03/07/23  4:16 PM   Specimen: BLOOD  Result Value Ref Range   Specimen Description BLOOD SITE NOT SPECIFIED    Special Requests      BOTTLES DRAWN AEROBIC  AND ANAEROBIC Blood Culture results may not be optimal due to an inadequate volume of blood received in culture bottles   Culture      NO GROWTH < 24 HOURS Performed at [facility], [address] [place], [place] [address]    Report Status PENDING   Lactic acid, plasma     Status: Abnormal   Collection Time: 03/07/23  4:25 PM  Result Value Ref Range   Lactic Acid, Venous 2.2 (HH) 0.5 - 1.9 mmol/L    Comment: CRITICAL RESULT CALLED TO, READ BACK BY AND VERIFIED WITH C BLANKS,RN 1735 03/07/2023 WBOND Performed at [facility], [address], [place], [place] [address]   Troponin I (High Sensitivity)     Status: None   Collection Time: 03/07/23  4:25 PM  Result Value Ref Range   Troponin I (High Sensitivity) 15 <18 ng/L    Comment: (NOTE) Elevated high sensitivity troponin I (hsTnI) values and significant  changes across serial measurements may suggest ACS but many other  chronic and acute conditions are known to elevate hsTnI results.  Refer to the "Links" section for chest pain algorithms and additional  guidance. Performed at [facility], [address], [place], [place] [address]   Brain natriuretic peptide     Status: Abnormal   Collection Time: 03/07/23  4:25 PM  Result Value Ref Range   B Natriuretic Peptide 609.4 (H) 0.0 - 100.0 pg/mL    Comment: Performed at [facility], [address] [place], [place] [address]  CBC with Differential     Status: Abnormal   Collection Time: 03/07/23  4:25 PM  Result Value Ref Range   WBC 8.3 4.0 - 10.5 K/uL   RBC 5.21 (H) 3.87 - 5.11 MIL/uL   Hemoglobin 15.1 (H) 12.0 - 15.0 g/dL   HCT 96.2 (H) 95.2 - 84.1 %   MCV 89.4 80.0 - 100.0 fL   MCH 29.0 26.0 - 34.0 pg   MCHC 32.4 30.0 - 36.0 g/dL   RDW 32.4 (H) 40.1 - 02.7 %   Platelets 201 150 - 400 K/uL   nRBC 0.0 0.0 - 0.2 %   Neutrophils Relative % 54 %   Neutro Abs 4.5 1.7 - 7.7 K/uL   Lymphocytes Relative 26 %   Lymphs Abs 2.2 0.7 - 4.0 K/uL    Monocytes Relative 19 %   Monocytes Absolute 1.5 (H) 0.1 - 1.0 K/uL   Eosinophils Relative 0 %   Eosinophils Absolute 0.0 0.0 - 0.5 K/uL   Basophils Relative 0 %   Basophils Absolute 0.0 0.0 - 0.1 K/uL   Immature Granulocytes 1 %   Abs Immature Granulocytes 0.04 0.00 - 0.07 K/uL    Comment: Performed at [facility], [address], [place], [place] [address]  Troponin I (High Sensitivity)  Status: Abnormal   Collection Time: 03/07/23  7:05 PM  Result Value Ref Range   Troponin I (High Sensitivity) 20 (H) <18 ng/L    Comment: (NOTE) Elevated high sensitivity troponin I (hsTnI) values and significant  changes across serial measurements may suggest ACS but many other  chronic and acute conditions are known to elevate hsTnI results.  Refer to the "Links" section for chest pain algorithms and additional  guidance. Performed at [facility], [address] [place], [place] [address]   Comprehensive metabolic panel     Status: Abnormal   Collection Time: 03/07/23  7:05 PM  Result Value Ref Range   Sodium 136 135 - 145 mmol/L    Comment: DELTA CHECK NOTED   Potassium 3.3 (L) 3.5 - 5.1 mmol/L   Chloride 94 (L) 98 - 111 mmol/L   CO2 28 22 - 32 mmol/L   Glucose, Bld 100 (H) 70 - 99 mg/dL    Comment: Glucose reference range applies only to samples taken after fasting for at least 8 hours.   BUN 24 (H) 8 - 23 mg/dL   Creatinine, Ser 0.10 (H) 0.44 - 1.00 mg/dL   Calcium 9.2 8.9 - 27.2 mg/dL   Total Protein 6.1 (L) 6.5 - 8.1 g/dL   Albumin 3.2 (L) 3.5 - 5.0 g/dL   AST 34 15 - 41 U/L   ALT 21 0 - 44 U/L   Alkaline Phosphatase 128 (H) 38 - 126 U/L   Total Bilirubin 4.3 (H) 0.3 - 1.2 mg/dL   GFR, Estimated 45 (L) >60 mL/min    Comment: (NOTE) Calculated using the CKD-EPI Creatinine Equation (2021)    Anion gap 14 5 - 15    Comment: Performed at [facility], [address], [place], [place] [address]  Lipase, blood     Status: None   Collection Time: 03/07/23   7:05 PM  Result Value Ref Range   Lipase 39 11 - 51 U/L    Comment: Performed at [facility], [address] [place], [place] [address]  Magnesium     Status: None   Collection Time: 03/07/23  7:05 PM  Result Value Ref Range   Magnesium 1.9 1.7 - 2.4 mg/dL    Comment: Performed at [facility], [address], [place], [place] [address]  Urinalysis, Routine w reflex microscopic -Urine, Clean Catch     Status: Abnormal   Collection Time: 03/08/23 12:02 AM  Result Value Ref Range   Color, Urine AMBER (A) YELLOW    Comment: BIOCHEMICALS MAY BE AFFECTED BY COLOR   APPearance HAZY (A) CLEAR   Specific Gravity, Urine 1.017 1.005 - 1.030   pH 5.0 5.0 - 8.0   Glucose, UA >=500 (A) NEGATIVE mg/dL   Hgb urine dipstick LARGE (A) NEGATIVE   Bilirubin Urine NEGATIVE NEGATIVE   Ketones, ur NEGATIVE NEGATIVE mg/dL   Protein, ur NEGATIVE NEGATIVE mg/dL   Nitrite NEGATIVE NEGATIVE   Leukocytes,Ua NEGATIVE NEGATIVE   RBC / HPF 6-10 0 - 5 RBC/hpf   WBC, UA 0-5 0 - 5 WBC/hpf   Bacteria, UA RARE (A) NONE SEEN   Squamous Epithelial / HPF 6-10 0 - 5 /HPF   Hyaline Casts, UA PRESENT     Comment: Performed at [facility], [address] [place], [place] [address]  Sedimentation rate     Status: None   Collection Time: 03/08/23  4:53 AM  Result Value Ref Range  Sed Rate 1 0 - 22 mm/hr    Comment: Performed at [facility], [address], [place], [place] [address]  C-reactive protein     Status: Abnormal   Collection Time: 03/08/23  4:53 AM  Result Value Ref Range   CRP 2.9 (H) <1.0 mg/dL    Comment: Performed at [facility], [address] [place], [place] [address]  CBC with Differential/Platelet     Status: Abnormal   Collection Time: 03/08/23  4:53 AM  Result Value Ref Range   WBC 11.2 (H) 4.0 - 10.5 K/uL   RBC 5.09 3.87 - 5.11 MIL/uL   Hemoglobin 14.9 12.0 - 15.0 g/dL   HCT 09.8 11.9 - 14.7 %   MCV 87.6 80.0 - 100.0 fL   MCH 29.3 26.0 - 34.0 pg   MCHC  33.4 30.0 - 36.0 g/dL   RDW 82.9 (H) 56.2 - 13.0 %   Platelets 220 150 - 400 K/uL   nRBC 0.0 0.0 - 0.2 %   Neutrophils Relative % 68 %   Neutro Abs 7.4 1.7 - 7.7 K/uL   Lymphocytes Relative 14 %   Lymphs Abs 1.6 0.7 - 4.0 K/uL   Monocytes Relative 18 %   Monocytes Absolute 2.1 (H) 0.1 - 1.0 K/uL   Eosinophils Relative 0 %   Eosinophils Absolute 0.0 0.0 - 0.5 K/uL   Basophils Relative 0 %   Basophils Absolute 0.0 0.0 - 0.1 K/uL   Immature Granulocytes 0 %   Abs Immature Granulocytes 0.05 0.00 - 0.07 K/uL    Comment: Performed at [facility], [address], [place], [place] [address]  Comprehensive metabolic panel     Status: Abnormal   Collection Time: 03/08/23 12:20 PM  Result Value Ref Range   Sodium 135 135 - 145 mmol/L   Potassium 4.1 3.5 - 5.1 mmol/L   Chloride 97 (L) 98 - 111 mmol/L   CO2 25 22 - 32 mmol/L   Glucose, Bld 126 (H) 70 - 99 mg/dL    Comment: Glucose reference range applies only to samples taken after fasting for at least 8 hours.   BUN 27 (H) 8 - 23 mg/dL   Creatinine, Ser 4.69 (H) 0.44 - 1.00 mg/dL   Calcium 8.7 (L) 8.9 - 10.3 mg/dL   Total Protein 6.1 (L) 6.5 - 8.1 g/dL   Albumin 3.2 (L) 3.5 - 5.0 g/dL   AST 29 15 - 41 U/L   ALT 20 0 - 44 U/L   Alkaline Phosphatase 126 38 - 126 U/L   Total Bilirubin 4.4 (H) 0.3 - 1.2 mg/dL   GFR, Estimated 39 (L) >60 mL/min    Comment: (NOTE) Calculated using the CKD-EPI Creatinine Equation (2021)    Anion gap 13 5 - 15    Comment: Performed at [facility], [address] [place], [place] [address]  Magnesium     Status: None   Collection Time: 03/08/23 12:20 PM  Result Value Ref Range   Magnesium 2.3 1.7 - 2.4 mg/dL    Comment: Performed at [facility], [address], [place], [place] [address]   ECHOCARDIOGRAM COMPLETE  Result Date: 03/08/2023    ECHOCARDIOGRAM REPORT   Patient Name:   Kristina Martin Date of Exam: 03/08/2023 Medical Rec #:  [id number]     Height:       62.0 in Accession #:     [id number]    Weight:  136.7 lb Date of Birth:  1946-04-08    BSA:          1.626 m Patient Age:    59 years      BP:           127/70 mmHg Patient Gender: F             HR:           75 bpm. Exam Location:  Inpatient Procedure: 2D Echo, Cardiac Doppler and Color Doppler Indications:    CHF  History:        Patient has prior history of Echocardiogram examinations. CHF,                 Arrythmias:Atrial Fibrillation; Risk Factors:Hypertension.  Sonographer:    Wallie Char Referring Phys: [phone] ERIC CHEN IMPRESSIONS  1. Left ventricular ejection fraction, by estimation, is 60 to 65%. The left ventricle has normal function. The left ventricle has no regional wall motion abnormalities. Left ventricular diastolic function could not be evaluated. There is the interventricular septum is flattened in systole and diastole, consistent with right ventricular pressure and volume overload.  2. Right ventricular systolic function is moderately reduced. The right ventricular size is severely enlarged. There is moderately elevated pulmonary artery systolic pressure. The estimated right ventricular systolic pressure is 52.5 mmHg.  3. Right atrial size was severely dilated.  4. The mitral valve is degenerative. Trivial mitral valve regurgitation. Severe mitral annular calcification.  5. The tricuspid valve is abnormal. Tricuspid valve regurgitation is severe.  6. The aortic valve is tricuspid. Aortic valve regurgitation is not visualized. Aortic valve sclerosis/calcification is present, without any evidence of aortic stenosis.  7. The inferior vena cava is dilated in size with <50% respiratory variability, suggesting right atrial pressure of 15 mmHg. Comparison(s): Prior images reviewed side by side. Mitral insufficiency is less severe on the current study. The right ventricle was moderately depressed on the previous study as well. FINDINGS  Left Ventricle: Left ventricular ejection fraction, by estimation, is 60 to 65%. The  left ventricle has normal function. The left ventricle has no regional wall motion abnormalities. The left ventricular internal cavity size was normal in size. There is  no left ventricular hypertrophy. The interventricular septum is flattened in systole and diastole, consistent with right ventricular pressure and volume overload. Left ventricular diastolic function could not be evaluated due to atrial fibrillation. Left ventricular diastolic function could not be evaluated. Right Ventricle: The right ventricular size is severely enlarged. No increase in right ventricular wall thickness. Right ventricular systolic function is moderately reduced. There is moderately elevated pulmonary artery systolic pressure. The tricuspid regurgitant velocity is 3.06 m/s, and with an assumed right atrial pressure of 15 mmHg, the estimated right ventricular systolic pressure is 52.5 mmHg. Left Atrium: Left atrial size was normal in size. Right Atrium: Right atrial size was severely dilated. Prominent Eustachian valve. Pericardium: Trivial pericardial effusion is present. Mitral Valve: The mitral valve is degenerative in appearance. Severe mitral annular calcification. Trivial mitral valve regurgitation. MV peak gradient, 4.8 mmHg. The mean mitral valve gradient is 1.0 mmHg. Tricuspid Valve: The tricuspid valve is abnormal. Tricuspid valve regurgitation is severe. Aortic Valve: The aortic valve is tricuspid. Aortic valve regurgitation is not visualized. Aortic valve sclerosis/calcification is present, without any evidence of aortic stenosis. Aortic valve mean gradient measures 4.2 mmHg. Aortic valve peak gradient measures 7.5 mmHg. Aortic valve area, by VTI measures 1.99 cm. Pulmonic Valve: The pulmonic valve was normal in  structure. Pulmonic valve regurgitation is trivial. Aorta: The aortic root and ascending aorta are structurally normal, with no evidence of dilitation. Venous: The inferior vena cava is dilated in size with less  than 50% respiratory variability, suggesting right atrial pressure of 15 mmHg. IAS/Shunts: No atrial level shunt detected by color flow Doppler.  LEFT VENTRICLE PLAX 2D LVIDd:         3.80 cm     Diastology LVIDs:         2.50 cm     LV e' medial:    6.27 cm/s LV PW:         0.90 cm     LV E/e' medial:  16.1 LV IVS:        0.70 cm     LV e' lateral:   10.10 cm/s LVOT diam:     1.70 cm     LV E/e' lateral: 10.0 LV SV:         50 LV SV Index:   31 LVOT Area:     2.27 cm  LV Volumes (MOD) LV vol d, MOD A2C: 55.2 ml LV vol d, MOD A4C: 39.4 ml LV vol s, MOD A2C: 19.8 ml LV vol s, MOD A4C: 14.5 ml LV SV MOD A2C:     35.4 ml LV SV MOD A4C:     39.4 ml LV SV MOD BP:      30.3 ml RIGHT VENTRICLE            IVC RV Basal diam:  3.60 cm    IVC diam: 2.60 cm RV S prime:     8.82 cm/s TAPSE (M-mode): 1.3 cm LEFT ATRIUM             Index        RIGHT ATRIUM           Index LA diam:        3.10 cm 1.91 cm/m   RA Area:     25.90 cm LA Vol (A2C):   32.0 ml 19.68 ml/m  RA Volume:   89.70 ml  55.16 ml/m LA Vol (A4C):   32.6 ml 20.05 ml/m LA Biplane Vol: 32.6 ml 20.05 ml/m  AORTIC VALVE AV Area (Vmax):    1.84 cm AV Area (Vmean):   1.75 cm AV Area (VTI):     1.99 cm AV Vmax:           136.50 cm/s AV Vmean:          94.450 cm/s AV VTI:            0.254 m AV Peak Grad:      7.5 mmHg AV Mean Grad:      4.2 mmHg LVOT Vmax:         110.40 cm/s LVOT Vmean:        72.900 cm/s LVOT VTI:          0.222 m LVOT/AV VTI ratio: 0.87  AORTA Ao Root diam: 3.00 cm Ao Asc diam:  3.10 cm MITRAL VALVE                TRICUSPID VALVE MV Area (PHT): 4.83 cm     TR Peak grad:   37.5 mmHg MV Area VTI:   2.04 cm     TR Mean grad:   25.0 mmHg MV Peak grad:  4.8 mmHg     TR Vmax:        306.00 cm/s MV Mean grad:  1.0 mmHg  TR Vmean:       241.0 cm/s MV Vmax:       1.10 m/s MV Vmean:      41.2 cm/s    SHUNTS MV Decel Time: 157 msec     Systemic VTI:  0.22 m MV E velocity: 101.00 cm/s  Systemic Diam: 1.70 cm Rachelle Hora Croitoru MD Electronically signed by  Thurmon Fair MD Signature Date/Time: 03/08/2023/11:52:24 AM    Final    CT CHEST ABDOMEN PELVIS W CONTRAST  Result Date: 03/07/2023 CLINICAL DATA:  Altered mental status and sepsis. Dry cough for 2 days. Bilateral leg swelling. Hypoxic. EXAM: CT CHEST, ABDOMEN, AND PELVIS WITH CONTRAST TECHNIQUE: Multidetector CT imaging of the chest, abdomen and pelvis was performed following the standard protocol during bolus administration of intravenous contrast. RADIATION DOSE REDUCTION: This exam was performed according to the departmental dose-optimization program which includes automated exposure control, adjustment of the mA and/or kV according to patient size and/or use of iterative reconstruction technique. CONTRAST:  60mL OMNIPAQUE IOHEXOL 350 MG/ML SOLN COMPARISON:  Chest radiograph earlier today FINDINGS: CT CHEST FINDINGS Cardiovascular: Cardiomegaly with dilation of the right atrium and reflux of contrast into the hepatic veins compatible with elevated right heart pressures. Mitral annular and aortic valve calcification. Coronary artery and aortic atherosclerotic calcification. 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 Additional 7 mm penetrating atherosclerotic ulcer in the posterior descending thoracic aorta. No aortic aneurysm. No central pulmonary embolism. Mediastinum/Nodes: Unremarkable esophagus.  No thoracic adenopathy. Lungs/Pleura: Advanced emphysema. No focal consolidation, pleural effusion, or pneumothorax. Musculoskeletal: No acute osseous abnormality. CT ABDOMEN PELVIS FINDINGS Hepatobiliary: Hepatic steatosis. Mild nodularity of the hepatic contour. Cholecystectomy. No biliary dilation. Pancreas: Unremarkable. Spleen: Unremarkable. Adrenals/Urinary Tract: Normal adrenal glands. Bilateral cortical renal scarring. Oval heterogenous enhancing lesion in the lower pole of the right kidney measuring 3.7 x 3.0 x 4.1 cm consistent  with neoplasm. No obstructing urinary calculi or hydronephrosis. Unremarkable bladder. Stomach/Bowel: Normal caliber large and small bowel. No bowel wall thickening. The appendix is not definitively visualized. Stomach is within normal limits. Vascular/Lymphatic: Advanced aortic atherosclerotic calcification. 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 Plaque at the origin of the SMA causes moderate narrowing. Reproductive: No acute abnormality.  No adnexal mass. Other: Diffuse mesenteric edema. Small volume free fluid in the pelvis, left pericolic gutter, and about the spleen. Musculoskeletal: No acute osseous abnormality.  Body wall anasarca. IMPRESSION: 1. Cardiomegaly with dilation of the right atrium and reflux of contrast into the hepatic veins compatible with elevated right heart pressures. 2. Oval heterogenous enhancing lesion in the lower pole of the right kidney measuring up to 4.1 cm consistent with neoplasm. Recommend Urology consultation. 3. Hepatic steatosis with mild nodularity of the hepatic contour suggesting cirrhosis. 4. Diffuse mesenteric edema and small volume free fluid in the abdomen and pelvis. Body wall anasarca. 5. Aortic Atherosclerosis (ICD10-I70.0) and Emphysema (ICD10-J43.9). 6. 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 No aortic aneurysm or dissection. 7. 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 Moderate narrowing at the origin of the SMA. Electronically Signed   By: Minerva Fester M.D.   On: 03/07/2023 21:23   CT Head Wo Contrast  Result Date: 03/07/2023 CLINICAL DATA:  Mental status change, unknown cause EXAM: CT HEAD WITHOUT CONTRAST TECHNIQUE: Contiguous axial images were obtained from the base of the skull through the vertex  without intravenous contrast. RADIATION DOSE REDUCTION: This exam was performed according to the departmental dose-optimization program  which includes automated exposure control, adjustment of the mA and/or kV according to patient size and/or use of iterative reconstruction technique. COMPARISON:  CT head 12/14/2018 FINDINGS: Brain: No evidence of large-territorial acute infarction. No parenchymal hemorrhage. No mass lesion. No extra-axial collection. No mass effect or midline shift. No hydrocephalus. Basilar cisterns are patent. Vascular: No hyperdense vessel. Skull: No acute fracture or focal lesion. Sinuses/Orbits: Paranasal sinuses and mastoid air cells are clear. The orbits are unremarkable. Other: None. IMPRESSION: No acute intracranial abnormality. Electronically Signed   By: Tish Frederickson M.D.   On: 03/07/2023 21:19   DG Chest Port 1 View  Result Date: 03/07/2023 CLINICAL DATA:  Altered mental status EXAM: PORTABLE CHEST 1 VIEW COMPARISON:  X-ray 12/27/2021 FINDINGS: Enlarged cardiopericardial silhouette. No consolidation, pneumothorax or effusion. No edema. Hyperinflation. Calcified aorta. Overlapping cardiac leads IMPRESSION: Hyperinflation.  Enlarged cardiopericardial silhouette. Electronically Signed   By: Karen Kays M.D.   On: 03/07/2023 17:51    Pending Labs Unresulted Labs (From admission, onward)     Start     Ordered   03/09/23 0500  Basic metabolic panel  Daily,   R      03/08/23 0733   03/09/23 0500  Hepatic function panel  Tomorrow morning,   R        03/08/23 0748   03/08/23 0811  Procalcitonin  Once,   STAT       References:    Procalcitonin Lower Respiratory Tract Infection AND Sepsis Procalcitonin Algorithm   03/08/23 0811   03/07/23 1610  Lactic acid, plasma  Now then every 2 hours,   R (with STAT occurrences)      03/07/23 1611            Vitals/Pain Today's Vitals   03/08/23 1233 03/08/23 1234 03/08/23 1235 03/08/23 1300  BP:    104/79  Pulse: 63 68 63 (!) 101  Resp: 16 16 16 18   Temp:      TempSrc:      SpO2: 98% 98% 97% 98%  Weight:      Height:      PainSc:        Isolation  Precautions No active isolations  Medications Medications  diltiazem (CARDIZEM) 125 mg in dextrose 5% 125 mL (1 mg/mL) infusion (0 mg/hr Intravenous Stopped 03/08/23 1218)  apixaban (ELIQUIS) tablet 5 mg (5 mg Oral Given 03/08/23 0903)  acetaminophen (TYLENOL) tablet 650 mg (has no administration in time range)    Or  acetaminophen (TYLENOL) suppository 650 mg (has no administration in time range)  ondansetron (ZOFRAN) tablet 4 mg (has no administration in time range)    Or  ondansetron (ZOFRAN) injection 4 mg (has no administration in time range)  albuterol (PROVENTIL) (2.5 MG/3ML) 0.083% nebulizer solution 2.5 mg (has no administration in time range)  furosemide (LASIX) injection 40 mg (40 mg Intravenous Given 03/08/23 0903)  metoprolol tartrate (LOPRESSOR) tablet 50 mg (50 mg Oral Given 03/08/23 0902)  furosemide (LASIX) injection 60 mg (60 mg Intravenous Given 03/07/23 2025)  potassium chloride (KLOR-CON) packet 40 mEq (40 mEq Oral Given 03/07/23 2059)  iohexol (OMNIPAQUE) 350 MG/ML injection 60 mL (60 mLs Intravenous Contrast Given 03/07/23 2102)  magnesium sulfate IVPB 2 g 50 mL (0 g Intravenous Stopped 03/08/23 0258)  potassium chloride (KLOR-CON) packet 40 mEq (40 mEq Oral Given 03/08/23 0902)    Mobility walks with device  Focused Assessments Cardiac Assessment Handoff:  Cardiac Rhythm: Atrial fibrillation No results found for: "CKTOTAL", "CKMB", "CKMBINDEX", "TROPONINI" No results found for: "DDIMER" Does the Patient currently have chest pain? No    R Recommendations: See Admitting Provider Note  Report given to:   Additional Notes:

## 2023-03-08 NOTE — Plan of Care (Signed)

## 2023-03-09 ENCOUNTER — Inpatient Hospital Stay (HOSPITAL_COMMUNITY): Payer: PPO

## 2023-03-09 DIAGNOSIS — F322 Major depressive disorder, single episode, severe without psychotic features: Secondary | ICD-10-CM

## 2023-03-09 DIAGNOSIS — R600 Localized edema: Secondary | ICD-10-CM

## 2023-03-09 DIAGNOSIS — R627 Adult failure to thrive: Secondary | ICD-10-CM | POA: Diagnosis not present

## 2023-03-09 DIAGNOSIS — G9340 Encephalopathy, unspecified: Secondary | ICD-10-CM

## 2023-03-09 DIAGNOSIS — E872 Acidosis, unspecified: Secondary | ICD-10-CM | POA: Diagnosis not present

## 2023-03-09 DIAGNOSIS — I509 Heart failure, unspecified: Secondary | ICD-10-CM | POA: Diagnosis not present

## 2023-03-09 DIAGNOSIS — I4891 Unspecified atrial fibrillation: Secondary | ICD-10-CM | POA: Diagnosis not present

## 2023-03-09 DIAGNOSIS — N2889 Other specified disorders of kidney and ureter: Secondary | ICD-10-CM | POA: Diagnosis not present

## 2023-03-09 LAB — MAGNESIUM: Magnesium: 2.1 mg/dL (ref 1.7–2.4)

## 2023-03-09 LAB — BASIC METABOLIC PANEL
Anion gap: 13 (ref 5–15)
BUN: 30 mg/dL — ABNORMAL HIGH (ref 8–23)
CO2: 26 mmol/L (ref 22–32)
Calcium: 8.8 mg/dL — ABNORMAL LOW (ref 8.9–10.3)
Chloride: 98 mmol/L (ref 98–111)
Creatinine, Ser: 1.42 mg/dL — ABNORMAL HIGH (ref 0.44–1.00)
GFR, Estimated: 38 mL/min — ABNORMAL LOW (ref >60)
Glucose, Bld: 121 mg/dL — ABNORMAL HIGH (ref 70–99)
Potassium: 4 mmol/L (ref 3.5–5.1)
Sodium: 137 mmol/L (ref 135–145)

## 2023-03-09 LAB — CBC
HCT: 43.4 % (ref 36.0–46.0)
Hemoglobin: 13.9 g/dL (ref 12.0–15.0)
MCH: 28.5 pg (ref 26.0–34.0)
MCHC: 32 g/dL (ref 30.0–36.0)
MCV: 89.1 fL (ref 80.0–100.0)
Platelets: 196 10*3/uL (ref 150–400)
RBC: 4.87 MIL/uL (ref 3.87–5.11)
RDW: 16.1 % — ABNORMAL HIGH (ref 11.5–15.5)
WBC: 10.1 10*3/uL (ref 4.0–10.5)
nRBC: 0 % (ref 0.0–0.2)

## 2023-03-09 LAB — HEPATIC FUNCTION PANEL
ALT: 17 U/L (ref 0–44)
AST: 26 U/L (ref 15–41)
Albumin: 2.9 g/dL — ABNORMAL LOW (ref 3.5–5.0)
Alkaline Phosphatase: 116 U/L (ref 38–126)
Bilirubin, Direct: 2 mg/dL — ABNORMAL HIGH (ref 0.0–0.2)
Indirect Bilirubin: 2.2 mg/dL — ABNORMAL HIGH (ref 0.3–0.9)
Total Bilirubin: 4.2 mg/dL — ABNORMAL HIGH (ref 0.3–1.2)
Total Protein: 5.8 g/dL — ABNORMAL LOW (ref 6.5–8.1)

## 2023-03-09 LAB — MRSA NEXT GEN BY PCR, NASAL: MRSA by PCR Next Gen: NOT DETECTED

## 2023-03-09 LAB — LACTIC ACID, PLASMA: Lactic Acid, Venous: 2 mmol/L (ref 0.5–1.9)

## 2023-03-09 MED ORDER — SODIUM CHLORIDE 0.9 % IV SOLN
2.0000 g | INTRAVENOUS | Status: DC
  Administered 2023-03-09 – 2023-03-10 (×2): 2 g via INTRAVENOUS
  Filled 2023-03-09 (×2): qty 20

## 2023-03-09 MED ORDER — SODIUM CHLORIDE 0.9 % IV BOLUS
500.0000 mL | Freq: Once | INTRAVENOUS | Status: AC
  Administered 2023-03-09: 500 mL via INTRAVENOUS

## 2023-03-09 MED ORDER — SERTRALINE HCL 50 MG PO TABS
25.0000 mg | ORAL_TABLET | Freq: Every day | ORAL | Status: DC
  Administered 2023-03-09 – 2023-03-16 (×8): 25 mg via ORAL
  Filled 2023-03-09 (×8): qty 1

## 2023-03-09 NOTE — Progress Notes (Signed)
OT Cancellation Note  Patient Details Name: Kristina Martin MRN: [id number] DOB: [birth year]   Cancelled Treatment:    Reason Eval/Treat Not Completed: Patient declined, no reason specified (Family requesting OT come back another day d/t patient being fast asleep and fatigued. OT will f/u as able)  03/09/2023  AB, OTR/L  Acute Rehabilitation Services  Office: [phone]   Kristina Martin 03/09/2023, 5:00 PM

## 2023-03-09 NOTE — Progress Notes (Signed)
Daily Progress Note   Patient Name: Kristina Martin       Date: 03/09/2023 DOB: [birth year]  Age: 77 y.o. MRN#: [id number] Attending Physician: Marinda Elk, MD Primary Care Physician: Kaleen Mask, MD Admit Date: 03/07/2023  Reason for Consultation/Follow-up: Establishing goals of care  Patient Profile/HPI:  77 y.o. female  with past medical history of diastolic heart failure last Echo March 2023 with EF of 55-60%, COPD, A-fib, HTN admitted on 03/07/2023 with complaints of progressive weakness, altered mental status, weeping leg edema. Workup revealed a-fib with RVR- started on cardizem drip with rate now improving, lower extremity cellulitis- being treated with antibiotics, heart failure exacerbation- diuresing, and acute kidney injury with Cr at 1.24. CT head with no acute findings. 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 Palliative consult received for "end of life".   Subjective: Chart reviewed including labs, progress notes, imaging from this and previous encounters. Cr is up today. Bili remains elevated. Lactic acid down from 2.7 to 2.0 today but still elevated. Patient sleeping heavily. Per son at bedside she was awake and fidgeting earlier. Continues to be aphasic. She is not eating or drinking. Discussed her history further. She has had ongoing changes in her speech and ability to communicate in the last few months. She was able to communicate her needs by writing things down. The only word she speaks is "yeah".   She was able to ambulate, but only minimally, with a shuffling step.  Has been very depressed since the  death of her daughter. Very poor po intake. We discussed the possibility of underlying dementia/cognitive disorder that has been exacerbated by this hospitalization. We also discussed that current interventions may keep her alive and be able to stabilize her but unfortunately, she is unlikely to return to fully functional state and will be at less level of functioning then she was prior to admission.  I also expressed concern that she exhibited changes in three major domains of life- functional status, nutrition, and cognition- over the last several months and this can sometimes indicate that someone is declining towards end of life. I discussed that patient would likely be eligible for hospice services at  home if they did not choose for her to go to a SNF for rehab after hospitalization.  [name] son and sister were understandably emotional- emotional support provided.  We discussed hope that diuresis and treating infection will improve her state, but also concern that she may get worse or fail to improve.  Code status was discussed. Encouraged patient/family to consider DNR/DNI status understanding evidenced based poor outcomes in similar hospitalized patients, as the cause of the arrest is likely associated with chronic/terminal disease rather than a reversible acute cardio-pulmonary event.  [name] son not yet ready to make any decisions regarding code status. He is working on processing the reality of the severity of his mother's illness.  We also discussed her likely untreated depression. They would be interested in starting her on an antidepressant.   Review of Systems  Unable to perform ROS: Mental status change     Physical Exam Vitals and nursing note reviewed.  Constitutional:      Appearance: She is ill-appearing.  Cardiovascular:     Rate and Rhythm: Normal rate.  Neurological:     Comments: lethargic             Vital Signs: BP (!) 73/48 (BP Location: Right Arm)   Pulse 96    Temp 98.5 F (36.9 C) (Oral)   Resp 18   Ht  (1.575 m)   Wt 68.4 kg   SpO2 99%   BMI 27.58 kg/m  SpO2: SpO2: 99 % O2 Device: O2 Device: Nasal Cannula O2 Flow Rate: O2 Flow Rate (L/min): 2 L/min  Intake/output summary:  Intake/Output Summary (Last 24 hours) at 03/09/2023 1741 Last data filed at 03/09/2023 1257 Gross per 24 hour  Intake 720 ml  Output 850 ml  Net -130 ml   LBM: Last BM Date :  (TBD) Baseline Weight: Weight: 62 kg Most recent weight: Weight: 68.4 kg       Palliative Assessment/Data: PPS: 20%      Patient Active Problem List   Diagnosis Date Noted   Atrial fibrillation with rapid ventricular response 03/08/2023   Left leg cellulitis 03/08/2023   AKI (acute kidney injury) 03/08/2023   Right renal mass 03/08/2023   Hyperbilirubinemia 03/08/2023   Mitral regurgitation 01/24/2022   Secondary hypercoagulable state 01/05/2022   Acute on chronic diastolic CHF (congestive heart failure)    Persistent atrial fibrillation 12/22/2021   Hypokalemia 12/22/2021   Anemia 12/22/2021   Tobacco abuse 12/22/2021   Fibroid    Hypertension     Palliative Care Assessment & Plan    Assessment/Recommendations/Plan  CHF exacerbation, leg cellulitis, new finding renal mass, failure to thrive in the setting of prior poor functional status and what sounds like potential underlying dementia- continue full code, full scope Major depression- would benefit from SSRI - also has some history of agitation/anxiety- will start low dose sertraline 25 mg po daily PMT will continue to follow   Code Status: Full code  Prognosis:  Unable to determine  Discharge Planning: To Be Determined  Care plan was discussed with patient's son and sister.   Thank you for allowing the Palliative Medicine Team to assist in the care of this patient.  Total time:  80 mins  Greater than 50%  of this time was spent counseling and coordinating care related to the above assessment and  plan.  Ocie Bob, AGNP-C Palliative Medicine   Please contact Palliative Medicine Team phone at [phone] for questions and concerns.

## 2023-03-09 NOTE — Progress Notes (Addendum)
[facility] PROGRESS NOTE    Progress Note  Kristina Martin  WUJ:[id number] DOB: 18-Nov-1946 DOA: 03/07/2023 PCP: Kaleen Mask, MD     Brief Narrative:   Kristina Martin is an 77 y.o. female past medical history of diastolic heart failure with an EF of 60% in March 2023, past medical history of heavy smoking not on oxygen at home, essential hypertension history of A-fib on Eliquis comes in with worsening lower extremity edema and drainage from the left lower extremity, patient has been noncompliant with his medication.  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 Continue Eliquis monitor electrolytes to keep potassium greater 4 magnesium greater than 2.  Chronic diastolic CHF (congestive heart failure)/right-sided heart failure BNP of 600, CT showed cardiomegaly.  Previous 2D echo back in 02/15/2022 showed a degenerative valve with mild mitral stenosis. Repeated 2D echo an EF of 60% no wall motion abnormality, show right ventricular systolic function moderately reduced, right ventricle severely dilated and elevated pulmonary pressures. With IV diuresis, creatinine is slowly trending up. Will hold on diuresis.  Given normal saline bolus. Continue strict I's and O's and daily weights Recheck b-met in am. Pt eval and OT evaluation  Acute kidney injury: With a baseline creatinine of less than 1 on admission 1.2 . Cr rising with IV diureses. Stop IV diuresis, repeat a basic metabolic panel in the  morning.  Acute metabolic encephalopathy: Question due to infectious etiology, CT of the head was unremarkable. I am concerned that she has underlying dementia that has been going on unnoticed. Her son relates that there has been a sudden change in her mental state, her recent caregiver died about 2 months ago she has a history of smoking, significant hypertension and new infectious etiology. Nonfocal on physical exam.  Left lower extremity cellulitis: He has woody edema on physical exam erythematous warm to touch serosanguineous drainage on the left more than on the right. On IV Rocephin, Tmax of 98.1 continue IV Rocephin Leukocytosis has resolved. Blood cultures remain negative to date.  Anticoagulation: Continue Eliquis for A-fib.  Hypokalemia: Resolved with oral repletion.  Essential hypertension: Her Lopressor was held on admission as she was started on IV diltiazem. Heart rate is improved today.  Hyperbilirubinemia: Billi  still elevated check an abdominal ultrasound. Question Gilbert's disease  Right renal mass: Will need follow-up as an outpatient.  Goals of care: Will need to get palliative care involved.   DVT prophylaxis: lovenox Family Communication:Son Status is: Inpatient Remains inpatient appropriate because:  left lower extremity cellulitis    Code Status:     Code Status Orders  (From admission, onward)           Start     Ordered   03/08/23 0137  Full code  Continuous       Question:  By:  Answer:  Consent: discussion documented in EHR   03/08/23 0137           Code Status History     Date Active Date  Inactive Code Status Order ID Comments User Context   03/08/2023 0116 03/08/2023 0137 Full Code [id number]  Carollee Herter, DO ED   12/22/2021 1350 12/28/2021 1933 Full Code [id number]  Orland Mustard, MD Inpatient         IV Access:   Peripheral IV   Procedures and diagnostic studies:   ECHOCARDIOGRAM COMPLETE  Result Date:  03/08/2023    ECHOCARDIOGRAM REPORT   Patient Name:   Kristina Martin Date of Exam: 03/08/2023 Medical Rec #:  [id number]     Height:       62.0 in Accession #:    [id number]    Weight:       136.7 lb Date of Birth:  October 06, 1946    BSA:          1.626 m Patient Age:    76 years      BP:           127/70 mmHg Patient Gender: F             HR:           75 bpm. Exam Location:  Inpatient Procedure: 2D Echo, Cardiac Doppler and Color Doppler Indications:    CHF  History:        Patient has prior history of Echocardiogram examinations. CHF,                 Arrythmias:Atrial Fibrillation; Risk Factors:Hypertension.  Sonographer:    Wallie Char Referring Phys: [phone] ERIC CHEN IMPRESSIONS  1. Left ventricular ejection fraction, by estimation, is 60 to 65%. The left ventricle has normal function. The left ventricle has no regional wall motion abnormalities. Left ventricular diastolic function could not be evaluated. There is the interventricular septum is flattened in systole and diastole, consistent with right ventricular pressure and volume overload.  2. Right ventricular systolic function is moderately reduced. The right ventricular size is severely enlarged. There is moderately elevated pulmonary artery systolic pressure. The estimated right ventricular systolic pressure is 52.5 mmHg.  3. Right atrial size was severely dilated.  4. The mitral valve is degenerative. Trivial mitral valve regurgitation. Severe mitral annular calcification.  5. The tricuspid valve is abnormal. Tricuspid valve regurgitation is severe.  6. The aortic valve is tricuspid. Aortic valve regurgitation is not visualized. Aortic valve sclerosis/calcification is present, without any evidence of aortic stenosis.  7. The inferior vena cava is dilated in size with <50% respiratory variability, suggesting right atrial pressure of 15 mmHg. Comparison(s): Prior images reviewed side by side. Mitral insufficiency is less severe on the current study. The right  ventricle was moderately depressed on the previous study as well. FINDINGS  Left Ventricle: Left ventricular ejection fraction, by estimation, is 60 to 65%. The left ventricle has normal function. The left ventricle has no regional wall motion abnormalities. The left ventricular internal cavity size was normal in size. There is  no left ventricular hypertrophy. The interventricular septum is flattened in systole and diastole, consistent with right ventricular pressure and volume overload. Left ventricular diastolic function could not be evaluated due to atrial fibrillation. Left ventricular diastolic function could not be evaluated. Right Ventricle: The right ventricular size is severely enlarged. No increase in right ventricular wall thickness. Right ventricular systolic function is moderately reduced. There is moderately elevated pulmonary artery systolic pressure. The tricuspid regurgitant velocity is 3.06 m/s, and with an assumed right atrial pressure of 15 mmHg, the estimated right ventricular systolic pressure is 52.5 mmHg. Left Atrium: Left atrial size  was normal in size. Right Atrium: Right atrial size was severely dilated. Prominent Eustachian valve. Pericardium: Trivial pericardial effusion is present. Mitral Valve: The mitral valve is degenerative in appearance. Severe mitral annular calcification. Trivial mitral valve regurgitation. MV peak gradient, 4.8 mmHg. The mean mitral valve gradient is 1.0 mmHg. Tricuspid Valve: The tricuspid valve is abnormal. Tricuspid valve regurgitation is severe. Aortic Valve: The aortic valve is tricuspid. Aortic valve regurgitation is not visualized. Aortic valve sclerosis/calcification is present, without any evidence of aortic stenosis. Aortic valve mean gradient measures 4.2 mmHg. Aortic valve peak gradient measures 7.5 mmHg. Aortic valve area, by VTI measures 1.99 cm. Pulmonic Valve: The pulmonic valve was normal in structure. Pulmonic valve regurgitation is trivial.  Aorta: The aortic root and ascending aorta are structurally normal, with no evidence of dilitation. Venous: The inferior vena cava is dilated in size with less than 50% respiratory variability, suggesting right atrial pressure of 15 mmHg. IAS/Shunts: No atrial level shunt detected by color flow Doppler.  LEFT VENTRICLE PLAX 2D LVIDd:         3.80 cm     Diastology LVIDs:         2.50 cm     LV e' medial:    6.27 cm/s LV PW:         0.90 cm     LV E/e' medial:  16.1 LV IVS:        0.70 cm     LV e' lateral:   10.10 cm/s LVOT diam:     1.70 cm     LV E/e' lateral: 10.0 LV SV:         50 LV SV Index:   31 LVOT Area:     2.27 cm  LV Volumes (MOD) LV vol d, MOD A2C: 55.2 ml LV vol d, MOD A4C: 39.4 ml LV vol s, MOD A2C: 19.8 ml LV vol s, MOD A4C: 14.5 ml LV SV MOD A2C:     35.4 ml LV SV MOD A4C:     39.4 ml LV SV MOD BP:      30.3 ml RIGHT VENTRICLE            IVC RV Basal diam:  3.60 cm    IVC diam: 2.60 cm RV S prime:     8.82 cm/s TAPSE (M-mode): 1.3 cm LEFT ATRIUM             Index        RIGHT ATRIUM           Index LA diam:        3.10 cm 1.91 cm/m   RA Area:     25.90 cm LA Vol (A2C):   32.0 ml 19.68 ml/m  RA Volume:   89.70 ml  55.16 ml/m LA Vol (A4C):   32.6 ml 20.05 ml/m LA Biplane Vol: 32.6 ml 20.05 ml/m  AORTIC VALVE AV Area (Vmax):    1.84 cm AV Area (Vmean):   1.75 cm AV Area (VTI):     1.99 cm AV Vmax:           136.50 cm/s AV Vmean:          94.450 cm/s AV VTI:            0.254 m AV Peak Grad:      7.5 mmHg AV Mean Grad:      4.2 mmHg LVOT Vmax:         110.40 cm/s LVOT Vmean:  72.900 cm/s LVOT VTI:          0.222 m LVOT/AV VTI ratio: 0.87  AORTA Ao Root diam: 3.00 cm Ao Asc diam:  3.10 cm MITRAL VALVE                TRICUSPID VALVE MV Area (PHT): 4.83 cm     TR Peak grad:   37.5 mmHg MV Area VTI:   2.04 cm     TR Mean grad:   25.0 mmHg MV Peak grad:  4.8 mmHg     TR Vmax:        306.00 cm/s MV Mean grad:  1.0 mmHg     TR Vmean:       241.0 cm/s MV Vmax:       1.10 m/s MV Vmean:      41.2  cm/s    SHUNTS MV Decel Time: 157 msec     Systemic VTI:  0.22 m MV E velocity: 101.00 cm/s  Systemic Diam: 1.70 cm Rachelle Hora Croitoru MD Electronically signed by Thurmon Fair MD Signature Date/Time: 03/08/2023/11:52:24 AM    Final    CT CHEST ABDOMEN PELVIS W CONTRAST  Result Date: 03/07/2023 CLINICAL DATA:  Altered mental status and sepsis. Dry cough for 2 days. Bilateral leg swelling. Hypoxic. EXAM: CT CHEST, ABDOMEN, AND PELVIS WITH CONTRAST TECHNIQUE: Multidetector CT imaging of the chest, abdomen and pelvis was performed following the standard protocol during bolus administration of intravenous contrast. RADIATION DOSE REDUCTION: This exam was performed according to the departmental dose-optimization program which includes automated exposure control, adjustment of the mA and/or kV according to patient size and/or use of iterative reconstruction technique. CONTRAST:  60mL OMNIPAQUE IOHEXOL 350 MG/ML SOLN COMPARISON:  Chest radiograph earlier today FINDINGS: CT CHEST FINDINGS Cardiovascular: Cardiomegaly with dilation of the right atrium and reflux of contrast into the hepatic veins compatible with elevated right heart pressures. Mitral annular and aortic valve calcification. Coronary artery and aortic atherosclerotic calcification. 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 Additional 7 mm penetrating atherosclerotic ulcer in the posterior descending thoracic aorta. No aortic aneurysm. No central pulmonary embolism. Mediastinum/Nodes: Unremarkable esophagus.  No thoracic adenopathy. Lungs/Pleura: Advanced emphysema. No focal consolidation, pleural effusion, or pneumothorax. Musculoskeletal: No acute osseous abnormality. CT ABDOMEN PELVIS FINDINGS Hepatobiliary: Hepatic steatosis. Mild nodularity of the hepatic contour. Cholecystectomy. No biliary dilation. Pancreas: Unremarkable. Spleen: Unremarkable. Adrenals/Urinary Tract: Normal adrenal  glands. Bilateral cortical renal scarring. Oval heterogenous enhancing lesion in the lower pole of the right kidney measuring 3.7 x 3.0 x 4.1 cm consistent with neoplasm. No obstructing urinary calculi or hydronephrosis. Unremarkable bladder. Stomach/Bowel: Normal caliber large and small bowel. No bowel wall thickening. The appendix is not definitively visualized. Stomach is within normal limits. Vascular/Lymphatic: Advanced aortic atherosclerotic calcification. 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 Plaque at the origin of the SMA causes moderate narrowing. Reproductive: No acute abnormality.  No adnexal mass. Other: Diffuse mesenteric edema. Small volume free fluid in the pelvis, left pericolic gutter, and about the spleen. Musculoskeletal: No acute osseous abnormality.  Body wall anasarca. IMPRESSION: 1. Cardiomegaly with dilation of the right atrium and reflux of contrast into the hepatic veins compatible with elevated right heart pressures. 2. Oval heterogenous enhancing lesion in the lower pole of the right kidney measuring up to 4.1 cm consistent with neoplasm. Recommend Urology consultation. 3. Hepatic steatosis with mild nodularity of the hepatic  contour suggesting cirrhosis. 4. Diffuse mesenteric edema and small volume free fluid in the abdomen and pelvis. Body wall anasarca. 5. Aortic Atherosclerosis (ICD10-I70.0) and Emphysema (ICD10-J43.9). 6. 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 No aortic aneurysm or dissection. 7. 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 Moderate narrowing at the origin of the SMA. Electronically Signed   By: Minerva Fester M.D.   On: 03/07/2023 21:23   CT Head Wo Contrast  Result Date: 03/07/2023 CLINICAL DATA:  Mental status change, unknown cause EXAM: CT HEAD WITHOUT CONTRAST TECHNIQUE: Contiguous axial images were obtained from the base of the skull  through the vertex without intravenous contrast. RADIATION DOSE REDUCTION: This exam was performed according to the departmental dose-optimization program which includes automated exposure control, adjustment of the mA and/or kV according to patient size and/or use of iterative reconstruction technique. COMPARISON:  CT head 12/14/2018 FINDINGS: Brain: No evidence of large-territorial acute infarction. No parenchymal hemorrhage. No mass lesion. No extra-axial collection. No mass effect or midline shift. No hydrocephalus. Basilar cisterns are patent. Vascular: No hyperdense vessel. Skull: No acute fracture or focal lesion. Sinuses/Orbits: Paranasal sinuses and mastoid air cells are clear. The orbits are unremarkable. Other: None. IMPRESSION: No acute intracranial abnormality. Electronically Signed   By: Tish Frederickson M.D.   On: 03/07/2023 21:19   DG Chest Port 1 View  Result Date: 03/07/2023 CLINICAL DATA:  Altered mental status EXAM: PORTABLE CHEST 1 VIEW COMPARISON:  X-ray 12/27/2021 FINDINGS: Enlarged cardiopericardial silhouette. No consolidation, pneumothorax or effusion. No edema. Hyperinflation. Calcified aorta. Overlapping cardiac leads IMPRESSION: Hyperinflation.  Enlarged cardiopericardial silhouette. Electronically Signed   By: Karen Kays M.D.   On: 03/07/2023 17:51     Medical Consultants:   None.   Subjective:    Kristina Martin has no new this morning.  Objective:    Vitals:   03/09/23 0015 03/09/23 0412 03/09/23 0419 03/09/23 0811  BP: 117/69 130/68  129/79  Pulse: 79 91  91  Resp: 18 19  18   Temp: 98.1 F (36.7 C) 97.6 F (36.4 C)  98.2 F (36.8 C)  TempSrc: Oral Oral  Oral  SpO2: 97% 98%  98%  Weight:   68.4 kg   Height:       SpO2: 98 % O2 Flow Rate (L/min): 2 L/min   Intake/Output Summary (Last 24 hours) at 03/09/2023 0954 Last data filed at 03/09/2023 0600 Gross per 24 hour  Intake 815.79 ml  Output 250 ml  Net 565.79 ml    Filed Weights   03/07/23  1613 03/09/23 0419  Weight: 62 kg 68.4 kg    Exam: General exam: In no acute distress. Respiratory system: Good air movement and clear to auscultation. Cardiovascular system: S1 & S2 heard, RRR. No JVD. Gastrointestinal system: Abdomen is nondistended, soft and nontender.  Skin: Warm to touch and tender erythematous and crusting in between her digits of her fingers bilaterally Psychiatry: No judgment and insight of medical condition.   Data Reviewed:    Labs: Basic Metabolic Panel: Recent Labs  Lab 03/07/23 1905 03/08/23 1220 03/09/23 0213  NA 136 135 137  K 3.3* 4.1 4.0  CL 94* 97* 98  CO2 28 25 26   GLUCOSE 100* 126* 121*  BUN 24* 27* 30*  CREATININE 1.24* 1.40* 1.42*  CALCIUM 9.2 8.7* 8.8*  MG 1.9 2.3 2.1    GFR Estimated Creatinine Clearance: 30.1 mL/min (A) (by C-G formula based on SCr of 1.42  mg/dL (H)). Liver Function Tests: Recent Labs  Lab 03/07/23 1905 03/08/23 1220 03/09/23 0213  AST 34 29 26  ALT ALKPHOS 128* 126 116  BILITOT 4.3* 4.4* 4.2*  PROT 6.1* 6.1* 5.8*  ALBUMIN 3.2* 3.2* 2.9*    Recent Labs  Lab 03/07/23 1905  LIPASE 39    No results for input(s): "AMMONIA" in the last 168 hours. Coagulation profile No results for input(s): "INR", "PROTIME" in the last 168 hours. COVID-19 Labs  Recent Labs    03/08/23 0453  CRP 2.9*     Lab Results  Component Value Date   SARSCOV2NAA NEGATIVE 03/07/2023   SARSCOV2NAA NEGATIVE 12/21/2021    CBC: Recent Labs  Lab 03/07/23 1625 03/08/23 0453 03/09/23 0213  WBC 8.3 11.2* 10.1  NEUTROABS 4.5 7.4  --   HGB 15.1* 14.9 13.9  HCT 46.6* 44.6 43.4  MCV 89.4 87.6 89.1  PLT 201 220 196    Cardiac Enzymes: No results for input(s): "CKTOTAL", "CKMB", "CKMBINDEX", "TROPONINI" in the last 168 hours. BNP (last 3 results) No results for input(s): "PROBNP" in the last 8760 hours. CBG: No results for input(s): "GLUCAP" in the last 168 hours. D-Dimer: No results for input(s):  "DDIMER" in the last 72 hours. Hgb A1c: No results for input(s): "HGBA1C" in the last 72 hours. Lipid Profile: No results for input(s): "CHOL", "HDL", "LDLCALC", "TRIG", "CHOLHDL", "LDLDIRECT" in the last 72 hours. Thyroid function studies: No results for input(s): "TSH", "T4TOTAL", "T3FREE", "THYROIDAB" in the last 72 hours.  Invalid input(s): "FREET3" Anemia work up: No results for input(s): "VITAMINB12", "FOLATE", "FERRITIN", "TIBC", "IRON", "RETICCTPCT" in the last 72 hours. Sepsis Labs: Recent Labs  Lab 03/07/23 1625 03/08/23 0453 03/08/23 1220 03/08/23 1801 03/09/23 0213  PROCALCITON  --   --  0.15  --   --   WBC 8.3 11.2*  --   --  10.1  LATICACIDVEN 2.2*  --   --  2.7* 2.0*    Microbiology Recent Results (from the past 240 hour(s))  SARS Coronavirus 2 by RT PCR (hospital order, performed in [facility] hospital lab) *cepheid single result test* Anterior Nasal Swab     Status: None   Collection Time: 03/07/23  4:10 PM   Specimen: Anterior Nasal Swab  Result Value Ref Range Status   SARS Coronavirus 2 by RT PCR NEGATIVE NEGATIVE Final    Comment: Performed at [facility], [address], [place], [place] [address]  Culture, blood (routine x 2)     Status: None (Preliminary result)   Collection Time: 03/07/23  4:11 PM   Specimen: BLOOD  Result Value Ref Range Status   Specimen Description BLOOD RIGHT ANTECUBITAL  Final   Special Requests   Final    BOTTLES DRAWN AEROBIC AND ANAEROBIC Blood Culture results may not be optimal due to an inadequate volume of blood received in culture bottles   Culture   Final    NO GROWTH 2 DAYS Performed at [facility], [address] [place], [place] [address]    Report Status PENDING  Incomplete  Culture, blood (routine x 2)     Status: None (Preliminary result)   Collection Time: 03/07/23  4:16 PM   Specimen: BLOOD  Result Value Ref Range Status   Specimen Description BLOOD SITE NOT SPECIFIED  Final   Special  Requests   Final    BOTTLES DRAWN AEROBIC AND ANAEROBIC Blood Culture results may not be optimal due to an  inadequate volume of blood received in culture bottles   Culture   Final    NO GROWTH 2 DAYS Performed at [facility], [address], [place], [place] [address]    Report Status PENDING  Incomplete  MRSA Next Gen by PCR, Nasal     Status: None   Collection Time: 03/09/23  1:57 AM   Specimen: Nasal Mucosa; Nasal Swab  Result Value Ref Range Status   MRSA by PCR Next Gen NOT DETECTED NOT DETECTED Final    Comment: (NOTE) The GeneXpert MRSA Assay (FDA approved for NASAL specimens only), is one component of a comprehensive MRSA colonization surveillance program. It is not intended to diagnose MRSA infection nor to guide or monitor treatment for MRSA infections. Test performance is not FDA approved in patients less than 45 years old. Performed at [facility], [address] [place], [place] [address]      Medications:    apixaban  5 mg Oral BID   furosemide  40 mg Intravenous Q12H   metoprolol tartrate  50 mg Oral BID   Continuous Infusions:  cefTRIAXone (ROCEPHIN)  IV 2 g (03/09/23 0230)   diltiazem (CARDIZEM) infusion Stopped (03/08/23 1218)      LOS: 1 day   Marinda Elk  [facility]  03/09/2023, 9:54 AM

## 2023-03-09 NOTE — Progress Notes (Addendum)
Consulted C. Hall, DO regarding lactic acid level and antibiotics PO for leg cellulitis per notes. She ordered to repeat Lactic acid, MRSA PCR and to start IV Abx. Patient is more alert and oriented to self and time. BP on soft side (SBP 100's), not in distress. Afebrile.   0230H - Fully awake. Oriented to person and time. Refused to eat dinner. Offered Ensure and consumed the whole carton. Plan of care ongoing.

## 2023-03-09 NOTE — Progress Notes (Signed)
Date and time results received: 03/09/23 0303  (use smartphrase ".now" to insert current time)  Test: Lactic Acid  Critical Value: 2  Name of Provider Notified: Delma Post, DO  Orders Received? Or Actions Taken?: Continue antibiotics

## 2023-03-10 DIAGNOSIS — L03116 Cellulitis of left lower limb: Secondary | ICD-10-CM | POA: Diagnosis not present

## 2023-03-10 DIAGNOSIS — I4891 Unspecified atrial fibrillation: Secondary | ICD-10-CM | POA: Diagnosis not present

## 2023-03-10 DIAGNOSIS — Z515 Encounter for palliative care: Secondary | ICD-10-CM

## 2023-03-10 DIAGNOSIS — I5033 Acute on chronic diastolic (congestive) heart failure: Secondary | ICD-10-CM | POA: Diagnosis not present

## 2023-03-10 DIAGNOSIS — R600 Localized edema: Secondary | ICD-10-CM | POA: Diagnosis not present

## 2023-03-10 DIAGNOSIS — N179 Acute kidney failure, unspecified: Secondary | ICD-10-CM | POA: Diagnosis not present

## 2023-03-10 DIAGNOSIS — I10 Essential (primary) hypertension: Secondary | ICD-10-CM

## 2023-03-10 DIAGNOSIS — Z789 Other specified health status: Secondary | ICD-10-CM

## 2023-03-10 DIAGNOSIS — G9340 Encephalopathy, unspecified: Secondary | ICD-10-CM | POA: Diagnosis not present

## 2023-03-10 DIAGNOSIS — I509 Heart failure, unspecified: Secondary | ICD-10-CM | POA: Diagnosis not present

## 2023-03-10 DIAGNOSIS — Z711 Person with feared health complaint in whom no diagnosis is made: Secondary | ICD-10-CM

## 2023-03-10 DIAGNOSIS — R638 Other symptoms and signs concerning food and fluid intake: Secondary | ICD-10-CM

## 2023-03-10 LAB — BASIC METABOLIC PANEL
Anion gap: 11 (ref 5–15)
BUN: 29 mg/dL — ABNORMAL HIGH (ref 8–23)
CO2: 28 mmol/L (ref 22–32)
Calcium: 8.4 mg/dL — ABNORMAL LOW (ref 8.9–10.3)
Chloride: 96 mmol/L — ABNORMAL LOW (ref 98–111)
Creatinine, Ser: 1.09 mg/dL — ABNORMAL HIGH (ref 0.44–1.00)
GFR, Estimated: 52 mL/min — ABNORMAL LOW (ref >60)
Glucose, Bld: 112 mg/dL — ABNORMAL HIGH (ref 70–99)
Potassium: 3.4 mmol/L — ABNORMAL LOW (ref 3.5–5.1)
Sodium: 135 mmol/L (ref 135–145)

## 2023-03-10 LAB — CBC
HCT: 39.7 % (ref 36.0–46.0)
Hemoglobin: 13.1 g/dL (ref 12.0–15.0)
MCH: 29.2 pg (ref 26.0–34.0)
MCHC: 33 g/dL (ref 30.0–36.0)
MCV: 88.6 fL (ref 80.0–100.0)
Platelets: 159 10*3/uL (ref 150–400)
RBC: 4.48 MIL/uL (ref 3.87–5.11)
RDW: 15.9 % — ABNORMAL HIGH (ref 11.5–15.5)
WBC: 9.1 10*3/uL (ref 4.0–10.5)
nRBC: 0 % (ref 0.0–0.2)

## 2023-03-10 LAB — GLUCOSE, CAPILLARY: Glucose-Capillary: 86 mg/dL (ref 70–99)

## 2023-03-10 MED ORDER — TRAZODONE HCL 50 MG PO TABS
50.0000 mg | ORAL_TABLET | Freq: Every day | ORAL | Status: DC
  Administered 2023-03-10: 50 mg via ORAL
  Filled 2023-03-10: qty 1

## 2023-03-10 MED ORDER — POTASSIUM CHLORIDE CRYS ER 20 MEQ PO TBCR
40.0000 meq | EXTENDED_RELEASE_TABLET | ORAL | Status: AC
  Administered 2023-03-10 (×2): 40 meq via ORAL
  Filled 2023-03-10 (×2): qty 2

## 2023-03-10 MED ORDER — SPIRONOLACTONE 12.5 MG HALF TABLET
12.5000 mg | ORAL_TABLET | Freq: Every day | ORAL | Status: DC
  Administered 2023-03-10 – 2023-03-13 (×4): 12.5 mg via ORAL
  Filled 2023-03-10 (×4): qty 1

## 2023-03-10 MED ORDER — DAPAGLIFLOZIN PROPANEDIOL 10 MG PO TABS
10.0000 mg | ORAL_TABLET | Freq: Every day | ORAL | Status: DC
  Administered 2023-03-10 – 2023-03-16 (×7): 10 mg via ORAL
  Filled 2023-03-10 (×7): qty 1

## 2023-03-10 MED ORDER — CEPHALEXIN 250 MG PO CAPS
250.0000 mg | ORAL_CAPSULE | Freq: Three times a day (TID) | ORAL | Status: AC
  Administered 2023-03-10 – 2023-03-13 (×9): 250 mg via ORAL
  Filled 2023-03-10 (×9): qty 1

## 2023-03-10 NOTE — Care Management Important Message (Signed)
Important Message  Patient Details  Name: Kristina Martin MRN: [id number] Date of Birth: Mar 17, 1946   Medicare Important Message Given:  Yes     [name] 03/10/2023, 3:14 PM

## 2023-03-10 NOTE — Progress Notes (Signed)
This chaplain responded to PMT NP-Amber consult for providing the Pt. son-William emotional and spiritual support.  The Pt. slept through the chaplain visit.   The chaplain listened reflectively to [name] as he talked about his personal faith journey and how he is leaning on his faith as a source of strength to make decisions for the Pt. [name] is hopeful there will be a place in the Pt. admission he can learn more about the Pt. choices from the Pt.   [name] describes the Pt. as the caregiver in the family. The chaplain understands from [name] the Pt. grief from the death of her daughter may have impacted the Pt. health decline. At this time [name] is sorting his thoughts and gathering information for the next steps. [name] shares his  appreciation for the ongoing support from the medical team.  Theodosia Paling [phone]

## 2023-03-10 NOTE — Progress Notes (Signed)
Daily Progress Note   Patient Name: Kristina Martin       Date: 03/10/2023 DOB: 05/17/1946  Age: 77 y.o. MRN#: [id number] Attending Physician: Coralie Keens Primary Care Physician: Kaleen Mask, MD Admit Date: 03/07/2023  Reason for Consultation/Follow-up: Establishing goals of care  Subjective: I have reviewed medical records including EPIC notes, MAR, and labs. Received report from primary RN - no acute concerns.   Went to visit patient at bedside - son/Kristina Martin present. Patient was lying in bed awake, alert, only responds with "yeah" so difficult to assess orientation. She falls asleep and stays asleep for majority of my visit. No signs or non-verbal gestures of pain or discomfort noted. No respiratory distress, increased work of breathing, or secretions noted.   Emotional support provided to son. Therapeutic listening provided as he reflects on the course of patient's physical and mental health over the last several months. [name] daughter has recently passed and he notes patient has been depressed. Reviewed sertraline that was started for patient - medication education provided. [name] expresses how difficult it has been trying to navigate decisions on top of grieving the loss of family.   Detailed discussion was had around anticipatory care planning. [name] hopes patient will "be able to get the mobility back she had" but he is also realistic this may not happen and understands the importance of advanced care planning. He tells me "a part of me wonders if she wants it (PT/OT/hospitalizations)." Encouraged him to continue to make decisions based on her goals/thoughts/wishes. [name] does express feeling "lost" when it comes to decisions.   Discussed that the goal of rehab  is improvement/stabalization of functional status,  which can be a difficult goal to meet for patients with advanced illness and multiple medical conditions. Reviewed what is needed for someone to have a positive rehabilitation experience to include adequate nutritional intake as well as willingness/ability to participate. [name] notes that patient has a poor appetite and may not want to work with therapies; though, he is hopeful for her to try. We discussed a trial period of rehab with outpatient Palliative Care to follow. Pending rehab trajectory, family can make stepwise decisions. If patient is not finding benefit and/or continues to decline, would recommend hospice care at that time - [name] agrees. He is open to outpatient Palliative Care following  at discharge. This plan would also allow time for SSRI to begin benefit and/or time to make dose adjustments if needed.   MOST form was introduced and reviewed in detail. He would like to discuss it with his wife tonight and is open for PMT follow up tomorrow to finalize.   [name] indicates he may need grief counseling. Provided information around hospice grief counseling community services.   Offered visit from chaplain - he accepts.   [name] expresses appreciation for visit today.  All questions and concerns addressed. Encouraged to call with questions and/or concerns. PMT card provided.  Length of Stay: 2  Current Medications: Scheduled Meds:   apixaban  5 mg Oral BID   metoprolol tartrate  50 mg Oral BID   sertraline  25 mg Oral Daily    Continuous Infusions:  cefTRIAXone (ROCEPHIN)  IV Stopped (03/10/23 0249)   diltiazem (CARDIZEM) infusion Stopped (03/08/23 1218)    PRN Meds: acetaminophen **OR** acetaminophen, albuterol, ondansetron **OR** ondansetron (ZOFRAN) IV  Physical Exam Vitals and nursing note reviewed.  Constitutional:      General: She is not in acute distress.    Appearance: She is ill-appearing.  Pulmonary:      Effort: No respiratory distress.  Skin:    General: Skin is warm and dry.  Neurological:     Mental Status: She is alert.     Motor: Weakness present.  Psychiatric:        Behavior: Behavior is cooperative.             Vital Signs: BP 130/83 (BP Location: Right Arm)   Pulse 90   Temp 98 F (36.7 C) (Oral)   Resp 17   Ht 5\' 2"  (1.575 m)   Wt 67.9 kg   SpO2 94%   BMI 27.38 kg/m  SpO2: SpO2: 94 % O2 Device: O2 Device: Room Air O2 Flow Rate: O2 Flow Rate (L/min): 2 L/min  Intake/output summary:  Intake/Output Summary (Last 24 hours) at 03/10/2023 1338 Last data filed at 03/10/2023 4098 Gross per 24 hour  Intake 200 ml  Output 300 ml  Net -100 ml   LBM: Last BM Date : 03/10/23 Baseline Weight: Weight: 62 kg Most recent weight: Weight: 67.9 kg       Palliative Assessment/Data: PPS 40%      Patient Active Problem List   Diagnosis Date Noted   Depression, major, single episode, severe 03/09/2023   Atrial fibrillation with rapid ventricular response 03/08/2023   Left leg cellulitis 03/08/2023   AKI (acute kidney injury) 03/08/2023   Right renal mass 03/08/2023   Hyperbilirubinemia 03/08/2023   Mitral regurgitation 01/24/2022   Secondary hypercoagulable state 01/05/2022   Acute on chronic diastolic CHF (congestive heart failure)    Persistent atrial fibrillation 12/22/2021   Hypokalemia 12/22/2021   Anemia 12/22/2021   Tobacco abuse 12/22/2021   Fibroid    Hypertension     Palliative Care Assessment & Plan   Patient Profile: 77 y.o. female  with past medical history of diastolic heart failure last Echo March 2023 with EF of 55-60%, COPD, A-fib, HTN admitted on 03/07/2023 with complaints of progressive weakness, altered mental status, weeping leg edema. Workup revealed a-fib with RVR- started on cardizem drip with rate now improving, lower extremity cellulitis- being treated with antibiotics, heart failure exacerbation- diuresing, and acute kidney injury with Cr  at 1.24. CT head with no acute findings. 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 Palliative consult received for "end of life".   Assessment: Principal Problem:   Atrial fibrillation with rapid ventricular response Active Problems:   Hypertension   Hypokalemia   Acute on chronic diastolic CHF (congestive heart failure)   Secondary hypercoagulable state   Left leg cellulitis   AKI (acute kidney injury)   Right renal mass   Hyperbilirubinemia   Depression, major, single episode, severe   Concern about end of life  Recommendations/Plan: Continue full code/full scope Goal is for patient's discharge to rehab with outpatient Palliative Care to follow. If patient does not find benefit and/or continues to decline, family open for her transition to hospice care [facility] consulted for: outpatient Palliative Care referral Chaplain consulted for: spiritual and emotional support for patient and her son/Kristina Martin MOST form introduced - son will review tonight and would like to finalize with PMT tomorrow. Meeting scheduled for 11am PMT will continue to follow and support holistically  Goals of Care and Additional Recommendations: Limitations on Scope of Treatment: Full Scope Treatment  Code Status:    Code Status Orders  (From admission, onward)           Start     Ordered   03/08/23 0137  Full code  Continuous       Question:  By:  Answer:  Consent: discussion documented in EHR   03/08/23 0137           Code Status History     Date Active Date Inactive Code Status Order ID Comments User Context   03/08/2023 0116 03/08/2023 0137 Full Code [id number]  Carollee Herter, DO ED   12/22/2021 1350 12/28/2021 1933 Full Code [id number]  Orland Mustard, MD Inpatient       Prognosis:  Unable to determine  Discharge Planning: To Be  Determined  Care plan was discussed with primary RN, patient's son, Dr. Ella Jubilee  Thank you for allowing the Palliative Medicine Team to assist in the care of this patient.  Haskel Khan, NP  Please contact Palliative Medicine Team phone at [phone] for questions and concerns.   *Portions of this note are a verbal dictation therefore any spelling and/or grammatical errors are due to the "[employer]agon [employer] One" system interpretation.

## 2023-03-10 NOTE — Assessment & Plan Note (Signed)
Patient required IV diltiazem for rate control.  She was transitioned to oral metoprolol with good toleration.   Currently she continue in atrial fibrillation rhythm with rate in the 100's. Continue anticoagulation with apixaban.

## 2023-03-10 NOTE — Assessment & Plan Note (Signed)
Continue with sertraline. Patient very debilitated, plan for SNF at the time of her discharge. Follow up with palliative care as outpatient.  Will hold on trazodone for now.

## 2023-03-10 NOTE — [place] FL2 (Signed)
[place] MEDICAID FL2 LEVEL OF CARE FORM     IDENTIFICATION  Patient Name: Kristina Martin Birthdate: July 26, 1946 Sex: female Admission Date (Current Location): 03/07/2023  [facility] and [place] Number:  Producer, television/film/video and Address:  [facility], [address] [place], [place] [address]      Provider Number: [id number]  Attending Physician Name and Address:  Coralie Keens,*  Relative Name and Phone Number:  Verlin Dike) [phone]    Current Level of Care: Hospital Recommended Level of Care: Skilled Nursing Facility Prior Approval Number:    Date Approved/Denied:   PASRR Number: PASRR under review  Discharge Plan: SNF    Current Diagnoses: Patient Active Problem List   Diagnosis Date Noted   Depression, major, single episode, severe 03/09/2023   Atrial fibrillation with rapid ventricular response 03/08/2023   Left leg cellulitis 03/08/2023   AKI (acute kidney injury) 03/08/2023   Right renal mass 03/08/2023   Hyperbilirubinemia 03/08/2023   Mitral regurgitation 01/24/2022   Secondary hypercoagulable state 01/05/2022   Acute on chronic diastolic CHF (congestive heart failure)    Persistent atrial fibrillation 12/22/2021   Hypokalemia 12/22/2021   Anemia 12/22/2021   Tobacco abuse 12/22/2021   Fibroid    Hypertension     Orientation RESPIRATION BLADDER Height & Weight     Self, Place, Time  Normal Incontinent, External catheter (External Urinary Catheter) Weight: 149 lb 11.1 oz (67.9 kg) Height:   (157.5 cm)  BEHAVIORAL SYMPTOMS/MOOD NEUROLOGICAL BOWEL NUTRITION STATUS      Incontinent Diet (Please see discharge summary)  AMBULATORY STATUS COMMUNICATION OF NEEDS Skin   Extensive Assist Verbally Other (Comment) (Appropriate for ethnicity,dry,hematoma,Abrasion,Bil.,Ecchymosis,foot,hip,Leg,Flank,Bil.,lower,Erythema,abdomen,arm,groin,Bil.,petechiae,hand,finger,R,L,weeping leg,L,lower,Wound/Inc. LDAs)                        Personal Care Assistance Level of Assistance  Bathing, Feeding, Dressing Bathing Assistance: Maximum assistance Feeding assistance: Limited assistance (Needs assist with set up) Dressing Assistance: Maximum assistance     Functional Limitations Info  Sight, Hearing, Speech Sight Info:  (eyeglasses) Hearing Info:  (WDL) Speech Info:  (difficulty speaking)    SPECIAL CARE FACTORS FREQUENCY  PT (By licensed PT), OT (By licensed OT)     PT Frequency: 5x min weekly OT Frequency: 5x min weekly            Contractures Contractures Info: Not present    Additional Factors Info  Code Status, Allergies, Psychotropic Code Status Info: FULL Allergies Info: NKA Psychotropic Info: sertraline (ZOLOFT) tablet 25 mg daily         Current Medications (03/10/2023):  This is the current hospital active medication list Current Facility-Administered Medications  Medication Dose Route Frequency Provider Last Rate Last Admin   acetaminophen (TYLENOL) tablet 650 mg  650 mg Oral Q6H PRN Carollee Herter, DO   650 mg at 03/10/23 5621   Or   acetaminophen (TYLENOL) suppository 650 mg  650 mg Rectal Q6H PRN Carollee Herter, DO       albuterol (PROVENTIL) (2.5 MG/3ML) 0.083% nebulizer solution 2.5 mg  2.5 mg Nebulization Q4H PRN Carollee Herter, DO       apixaban Everlene Balls) tablet 5 mg  5 mg Oral BID Carollee Herter, DO   5 mg at 03/10/23 1101   cefTRIAXone (ROCEPHIN) 2 g in sodium chloride 0.9 % 100 mL IVPB  2 g Intravenous Q24H Dow Adolph N, DO   Stopped at 03/10/23 0249   diltiazem (CARDIZEM) 125 mg in  dextrose 5% 125 mL (1 mg/mL) infusion  5-15 mg/hr Intravenous Titrated Carollee Herter, DO   Stopped at 03/08/23 1218   metoprolol tartrate (LOPRESSOR) tablet 50 mg  50 mg Oral BID Marinda Elk, MD   50 mg at 03/10/23 1101   ondansetron (ZOFRAN) tablet 4 mg  4 mg Oral Q6H PRN Carollee Herter, DO       Or   ondansetron [facility]) injection 4 mg  4 mg Intravenous Q6H PRN Carollee Herter, DO       sertraline (ZOLOFT)  tablet 25 mg  25 mg Oral Daily Barbara Cower, NP   25 mg at 03/10/23 1101     Discharge Medications: Please see discharge summary for a list of discharge medications.  Relevant Imaging Results:  Relevant Lab Results:   Additional Information [id number]  Delilah Shan, LCSWA

## 2023-03-10 NOTE — Hospital Course (Signed)
Kristina Martin was admitted to the hospital with the working diagnosis of heart failure exacerbation.   77 yo female with the past medical history of heart failure, atrial fibrillation, and hypertension who presented with worsening lower extremity edema. Apparently patient stopped taking her medication 2 months ago (at that time her husband died). Reported worsening lower extremity edema, and lately with altered mental status and poor oral intake. On her initial physical examination her blood pressure was 143/78, HR 90, RR 22, and 02 saturation 96%, lungs with no wheezing or rales, heart with S1 and S2 present, irregularly irregular with no gallops, but tachycardic, abdomen with no distention and positive lower extremity edema. Bilateral lower extremity erythema.  Na 136, K 3,3 Cl 94, bicarbonate 28, glucose 100, bun 24 cr 1,24  BNP 604  CRP 2,9, lactic acid 2,2  Wbc 8,3 hgb 15,1 plt 201   Urine analysis SG 1,017, negative protein, negative leukocytes, glucose >500   CT head with no acute changes.  CT chest and abdomen/ pelvis, with centrilobular emphysema, with faint bilateral ground glass opacities. Right kidney lower pole 4,1 cm heterogenous enhancing lesion, consistent with neoplasm (recommendation to consult Urology).  Diffuse mesenteric edema.  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   Chest radiograph with hyperinflation with cardiomegaly, no infiltrates or effusions.   EKG 94 bpm, normal axis, normal qtc, low voltage, atrial fibrillation rhythm with no significant ST segment or T wave changes.   Patient developed RVR and was placed on diltiazem infusion.  Furosemide IV for diuresis.   04/20 patient with improvement in rate control and volume overload. Plan to transfer to SNF when bed available.  04/21: Continue to wean off oxygen as patient; diuresis dose has been adjusted and following discussion with patient's  sister CT scan of the head to rule out acute CVA has been ordered. 04/22. Patient is more awake and alert today, continue very weak and deconditioned. Head CT negative for acute changes.  Stable for SNF, pending placement.

## 2023-03-10 NOTE — Progress Notes (Signed)
Progress Note   Patient: Kristina Martin VQQ:[id number] DOB: Oct 20, 1946 DOA: 03/07/2023     2 DOS: the patient was seen and examined on 03/10/2023   Brief hospital course: Kristina Martin was admitted to the hospital with the working diagnosis of heart failure exacerbation.   77 yo female with the past medical history of heart failure, atrial fibrillation, and hypertension who presented with worsening lower extremity edema. Apparently patient stopped taking her medication 2 months ago (at that time her husband died). Reported worsening lower extremity edema, and lately with altered mental status and poor oral intake. On her initial physical examination her blood pressure was 143/78, HR 90, RR 22, and 02 saturation 96%, lungs with no wheezing or rales, heart with S1 and S2 present, irregularly irregular with no gallops, but tachycardic, abdomen with no distention and positive lower extremity edema. Bilateral lower extremity erythema.  Na 136, K 3,3 Cl 94, bicarbonate 28, glucose 100, bun 24 cr 1,24  BNP 604  CRP 2,9, lactic acid 2,2  Wbc 8,3 hgb 15,1 plt 201   Urine analysis SG 1,017, negative protein, negative leukocytes, glucose >500   CT head with no acute changes.  CT chest and abdomen/ pelvis, with centrilobular emphysema, with faint bilateral ground glass opacities. Right kidney lower pole 4,1 cm heterogenous enhancing lesion, consistent with neoplasm (recommendation to consult Urology).  Diffuse mesenteric edema.  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   Chest radiograph with hyperinflation with cardiomegaly, no infiltrates or effusions.   EKG 94 bpm, normal axis, normal qtc, low voltage, atrial fibrillation rhythm with no significant ST segment or T wave changes.   Patient developed RVR and was placed on diltiazem infusion.  Furosemide IV for diuresis.   Assessment and Plan: * Acute on chronic diastolic CHF  (congestive heart failure) Echocardiogram with preserved LV systolic function with EF 60 to 65%, interventricular septum is flattened in systole and diastole, RV with severe enlargement, moderate reduction in RV systolic function, RVSP 52,5 mmHg. RA with severe dilatation, trivial pericardial effusion, severe TR.   Acute on chronic core pulmonale. Pulmonary hypertension with RV failure.   Systolic blood pressure 102 to 126 mmHg.   Plan to continue diuresis with furosemide starting tomorrow, she had IV fluids yesterday. Resume spironolactone and dapagliflozin On metoprolol 50 mg po bid.   Secondary hypercoagulable state Continue Eliquis due to history of A-fib.  Hypokalemia Replete with oral K.  Keep serum potassium greater than 4.0.  Atrial fibrillation with rapid ventricular response Patient off diltiazem drip, and had been transitioned to metoprolol 50 mg po bid. Plan to continue anticoagulation with apixaban. Continue telemetry monitoring.   AKI (acute kidney injury) Renal function has improved with serum cr at 1,0 with K at 3,4 and serum bicarbonate at 28, Na is 135 and Mg 2,1   Plan to add Kcl 40 meq x 2 doses and follow up renal function in am. Holding on furosemide for now, will add spironolactone for now.  Hypertension Continue blood pressure control with metoprolol.   Left leg cellulitis Patient has been on ceftriaxone, will transition to oral cephalexin. Patient with no fever or leg pain.  Wraps in place today, but noted erythema to be improving.  Right renal mass I spoke with her son about this incidental finding, will need outpatient follow up with Urology.   Depression, major, single episode, severe Continue with sertraline. Patient very debilitated, plan for SNF  at the time of her discharge. Follow up with palliative care as outpatient.         Subjective: Patient does not want to interact, her son is at the bedside, she is able to answer questions and  follow commands.   Physical Exam: Vitals:   03/10/23 0548 03/10/23 0748 03/10/23 0900 03/10/23 1156  BP: 116/75 117/70  130/83  Pulse: 82 87 96 90  Resp: Temp: 97.6 F (36.4 C) 97.8 F (36.6 C)  98 F (36.7 C)  TempSrc: Oral Oral  Oral  SpO2: 98% 90% 93% 94%  Weight: 67.9 kg     Height:       Neurology awake and alert ENT with pallor Cardiovascular with S1 and  S2 present, irregularly irregular with no gallops, rubs or murmurs No JVD Trace lower extremity edema Respiratory with no rales or wheezing Abdomen with no distention  Data Reviewed:    Family Communication: I spoke with patient's son at the bedside, we talked in detail about patient's condition, plan of care and prognosis and all questions were addressed.   Disposition: Status is: Inpatient Remains inpatient appropriate because: pending placement   Planned Discharge Destination: Skilled nursing facility      Author: Coralie Keens, MD 03/10/2023 3:07 PM  For on call review [email or web address].

## 2023-03-10 NOTE — Evaluation (Signed)
Occupational Therapy Evaluation Patient Details Name: Kristina Martin MRN: [id number] DOB: [birth year] Today's Date: 03/10/2023   History of Present Illness Patient is a 77 year old female with complaints of progressive weakness, AMS, weeping leg edema. Found to have A-fib with RVR, LE cellulitis.   Clinical Impression   Patient admitted for the diagnosis above.  PTA, per the son, patient lives alone and was independent with all aspects of ADL, iADL and mobility.  Currently presents with lethargy, decreased LOA, and fair static sitting balance.  Patient was able to stand at bedside with Mod A and RW.  OT is indicated in the acute setting to address deficits, and Patient will benefit from continued inpatient follow up therapy, <3 hours/day, as she does not have the 24 hour assist needed to transition directly home.         Recommendations for follow up therapy are one component of a multi-disciplinary discharge planning process, led by the attending physician.  Recommendations may be updated based on patient status, additional functional criteria and insurance authorization.   Assistance Recommended at Discharge Frequent or constant Supervision/Assistance  Patient can return home with the following Help with stairs or ramp for entrance;Assist for transportation;Assistance with cooking/housework;A lot of help with walking and/or transfers;A lot of help with bathing/dressing/bathroom    Functional Status Assessment  Patient has had a recent decline in their functional status and demonstrates the ability to make significant improvements in function in a reasonable and predictable amount of time.  Equipment Recommendations  None recommended by OT    Recommendations for Other Services       Precautions / Restrictions Precautions Precautions: Fall Restrictions Weight Bearing Restrictions: No      Mobility Bed Mobility Overal bed mobility: Needs Assistance Bed Mobility: Supine to Sit, Sit  to Supine     Supine to sit: Mod assist Sit to supine: Max assist        Transfers Overall transfer level: Needs assistance Equipment used: Rolling walker (2 wheels) Transfers: Sit to/from Stand Sit to Stand: Mod assist                  Balance Overall balance assessment: Needs assistance Sitting-balance support: Feet supported Sitting balance-Leahy Scale: Fair     Standing balance support: Reliant on assistive device for balance Standing balance-Leahy Scale: Poor                             ADL either performed or assessed with clinical judgement   ADL Overall ADL's : Needs assistance/impaired Eating/Feeding: Minimal assistance;Bed level   Grooming: Wash/dry hands;Wash/dry face;Sitting;Bed level   Upper Body Bathing: Minimal assistance;Bed level   Lower Body Bathing: Maximal assistance;Bed level   Upper Body Dressing : Minimal assistance;Sitting   Lower Body Dressing: Maximal assistance;Bed level   Toilet Transfer: Maximal assistance;Squat-pivot;BSC/3in1                   Vision   Vision Assessment?: No apparent visual deficits     Perception     Praxis      Pertinent Vitals/Pain Pain Assessment Pain Assessment: No/denies pain     Hand Dominance Right   Extremity/Trunk Assessment Upper Extremity Assessment Upper Extremity Assessment: Generalized weakness   Lower Extremity Assessment Lower Extremity Assessment: Defer to PT evaluation   Cervical / Trunk Assessment Cervical / Trunk Assessment: Kyphotic   Communication Communication Communication: Expressive difficulties   Cognition Arousal/Alertness: Lethargic Behavior During  Therapy: WFL for tasks assessed/performed Overall Cognitive Status: Difficult to assess                                       General Comments  son present throughout session and assisted with mobility as needed. he is very supportive. 02 removed during mobility efforts with Sp02  in the mid 90's. replaced 02 at end of session    Exercises     Shoulder Instructions      Home Living Family/patient expects to be discharged to:: Private residence Living Arrangements: Spouse/significant other Available Help at Discharge: Family Type of Home: House Home Access: Stairs to enter Secretary/administrator of Steps: 3 Entrance Stairs-Rails: Left;Right Home Layout: One level     Bathroom Shower/Tub: Producer, television/film/video: Standard Bathroom Accessibility: Yes How Accessible: Accessible via walker Home Equipment: Agricultural consultant (2 wheels);Cane - single point   Additional Comments: son has plans to build a ramp and remodel the bathroom to make it more handicapped accessible      Prior Functioning/Environment Prior Level of Function : Needs assist             Mobility Comments: recently using a rolling walker for mobility due to progressive weakness. had been failry independent before acute medical issues ADLs Comments: recently has required assistance for bathing (son helps), needs cueing and intermittent assistance for eating, dressing        OT Problem List: Decreased strength;Decreased activity tolerance;Impaired balance (sitting and/or standing);Decreased knowledge of use of DME or AE;Decreased safety awareness;Decreased cognition;Increased edema      OT Treatment/Interventions: Self-care/ADL training    OT Goals(Current goals can be found in the care plan section) Acute Rehab OT Goals Patient Stated Goal: Return home OT Goal Formulation: With patient/family Time For Goal Achievement: 03/24/23 Potential to Achieve Goals: Fair ADL Goals Pt Will Perform Grooming: with min guard assist;standing Pt Will Perform Upper Body Dressing: with set-up;sitting Pt Will Perform Lower Body Dressing: with min assist;sit to/from stand Pt Will Transfer to Toilet: with min assist;ambulating;regular height toilet  OT Frequency: Min 2X/week     Co-evaluation              AM-PAC OT "6 Clicks" Daily Activity     Outcome Measure Help from another person eating meals?: A Little Help from another person taking care of personal grooming?: A Little Help from another person toileting, which includes using toliet, bedpan, or urinal?: A Lot Help from another person bathing (including washing, rinsing, drying)?: A Lot Help from another person to put on and taking off regular upper body clothing?: A Lot Help from another person to put on and taking off regular lower body clothing?: A Lot 6 Click Score: 14   End of Session Equipment Utilized During Treatment: Rolling walker (2 wheels) Nurse Communication: Mobility status  Activity Tolerance: Patient limited by lethargy Patient left: in bed;with call bell/phone within reach;with bed alarm set  OT Visit Diagnosis: Unsteadiness on feet (R26.81);Muscle weakness (generalized) (M62.81);Other symptoms and signs involving cognitive function                Time: 1329-1350 OT Time Calculation (min): 21 min Charges:  OT General Charges $OT Visit: 1 Visit OT Evaluation $OT Eval Moderate Complexity: 1 Mod  03/10/2023  RP, OTR/L  Acute Rehabilitation Services  Office:  [phone]   Suzanna Obey 03/10/2023, 3:12 PM

## 2023-03-10 NOTE — Evaluation (Signed)
Physical Therapy Evaluation Patient Details Name: Kristina Martin MRN: [id number] DOB: 07/02/1946 Today's Date: 03/10/2023  History of Present Illness  Patient is a 77 year old female with complaints of progressive weakness, AMS, weeping leg edema. Found to have A-fib with RVR, LE cellulitis.   Clinical Impression  Patient agreeable to PT evaluation with son present throughout session. The son reports patient lives at home with her spouse and has required increased physical assistance at home recently with mobility. She has steps to enter the home.  Today, the patient required increased time and multi-modal cues in order to follow single step commands. She appears fatigued with minimal activity. Physical assistance is required with all mobility. The patient was able to stand x  2 bouts with +2 person assistance. Standing tolerance limited to around 30 seconds. The son reports patient's spouse will likely be unable to physically assist the patient at home with mobility. Recommend to continue PT to maximize independence and decrease caregiver burden. Anticipate the need for frequent physical assistance at discharge and recommend to continue PT after this hospital discharge.      Recommendations for follow up therapy are one component of a multi-disciplinary discharge planning process, led by the attending physician.  Recommendations may be updated based on patient status, additional functional criteria and insurance authorization.  Follow Up Recommendations Can patient physically be transported by private vehicle: No     Assistance Recommended at Discharge Frequent or constant Supervision/Assistance  Patient can return home with the following  Two people to help with walking and/or transfers;A lot of help with bathing/dressing/bathroom;Assistance with cooking/housework;Help with stairs or ramp for entrance;Assist for transportation    Equipment Recommendations  (to be determined at next level of  care. would need hospital bed if going home)  Recommendations for Other Services       Functional Status Assessment Patient has had a recent decline in their functional status and demonstrates the ability to make significant improvements in function in a reasonable and predictable amount of time.     Precautions / Restrictions Precautions Precautions: Fall Restrictions Weight Bearing Restrictions: No      Mobility  Bed Mobility Overal bed mobility: Needs Assistance Bed Mobility: Supine to Sit, Sit to Supine     Supine to sit: Mod assist Sit to supine: Max assist   General bed mobility comments: assistance for trunk and BLE support. increased assistance required with return to bed.    Transfers Overall transfer level: Needs assistance Equipment used: Rolling walker (2 wheels) Transfers: Sit to/from Stand Sit to Stand: Mod assist, +2 physical assistance           General transfer comment: 2 bouts of standing performed. verbal cues for task initiation. slow processing with multi modal cues required with all mobility efforts    Ambulation/Gait               General Gait Details: patient deferred attempting to walk despite encouragement. limited activity toelrance in standing with fatigue with minimal activity. standing tolerance limited to around 30 seconds x 2 bouts  Stairs            Wheelchair Mobility    Modified Rankin (Stroke Patients Only)       Balance Overall balance assessment: Needs assistance Sitting-balance support: Feet supported Sitting balance-Leahy Scale:  (poor to fair) Sitting balance - Comments: patient intermittently will flex forward, placing arms on rolling walker for support. appears fatigued with minimal activity. unable to elaborate if feeling dizzy,etc. vitals  stable throughout   Standing balance support: Bilateral upper extremity supported Standing balance-Leahy Scale: Poor Standing balance comment: Min A required for  safety and heavy reliance on rolling walker for support                             Pertinent Vitals/Pain Pain Assessment Pain Assessment: No/denies pain    Home Living Family/patient expects to be discharged to:: Private residence Living Arrangements: Spouse/significant other Available Help at Discharge: Family Type of Home: House Home Access: Stairs to enter Entrance Stairs-Rails: Lawyer of Steps: 3   Home Layout: One level Home Equipment: Agricultural consultant (2 wheels);Cane - single point Additional Comments: son has plans to build a ramp and remodel the bathroom to make it more handicapped accessible    Prior Function Prior Level of Function : Needs assist             Mobility Comments: recently using a rolling walker for mobility due to progressive weakness. had been failry independent before acute medical issues ADLs Comments: recently has required assistance for bathing (son helps), needs cueing and intermittent assistance for eating, dressing     Hand Dominance        Extremity/Trunk Assessment   Upper Extremity Assessment Upper Extremity Assessment: Generalized weakness    Lower Extremity Assessment Lower Extremity Assessment: Generalized weakness (decreased weight acceptance on LLE with standing)       Communication   Communication: Expressive difficulties (states mostly "yeah" when asked questions (son reports she has been like this since her stroke))  Cognition Arousal/Alertness: Awake/alert Behavior During Therapy: Flat affect Overall Cognitive Status: Impaired/Different from baseline Area of Impairment: Following commands, Safety/judgement, Problem solving                       Following Commands: Follows one step commands with increased time (and repetition) Safety/Judgement: Decreased awareness of safety, Decreased awareness of deficits   Problem Solving: Slow processing, Decreased initiation,  Difficulty sequencing, Requires verbal cues, Requires tactile cues General Comments: patient states mostly "yeah" but occasionally will respond with other short phrases or no.        General Comments General comments (skin integrity, edema, etc.): son present throughout session and assisted with mobility as needed. he is very supportive. 02 removed during mobility efforts with Sp02 in the mid 90's. replaced 02 at end of session    Exercises     Assessment/Plan    PT Assessment Patient needs continued PT services  PT Problem List Decreased strength;Decreased range of motion;Decreased activity tolerance;Decreased mobility;Decreased balance;Decreased cognition;Decreased safety awareness;Decreased knowledge of precautions       PT Treatment Interventions DME instruction;Gait training;Stair training;Functional mobility training;Therapeutic activities;Therapeutic exercise;Balance training;Neuromuscular re-education;Cognitive remediation;Patient/family education;Wheelchair mobility training    PT Goals (Current goals can be found in the Care Plan section)  Acute Rehab PT Goals Patient Stated Goal: son's goal is for short term rehab PT Goal Formulation: With family Time For Goal Achievement: 03/24/23 Potential to Achieve Goals: Fair    Frequency Min 2X/week     Co-evaluation               AM-PAC PT "6 Clicks" Mobility  Outcome Measure Help needed turning from your back to your side while in a flat bed without using bedrails?: A Lot Help needed moving from lying on your back to sitting on the side of a flat bed without using bedrails?: A Lot Help  needed moving to and from a bed to a chair (including a wheelchair)?: A Lot Help needed standing up from a chair using your arms (e.g., wheelchair or bedside chair)?: Total Help needed to walk in hospital room?: Total Help needed climbing 3-5 steps with a railing? : Total 6 Click Score: 9    End of Session   Activity Tolerance:  Patient tolerated treatment well Patient left: in bed;with call bell/phone within reach;with bed alarm set;with family/visitor present Nurse Communication: Mobility status PT Visit Diagnosis: Muscle weakness (generalized) (M62.81);Unsteadiness on feet (R26.81)    Time: 1000-1028 PT Time Calculation (min) (ACUTE ONLY): 28 min   Charges:   PT Evaluation $PT Eval Moderate Complexity: 1 Mod          Donna Bernard, PT, MPT   Ina Homes 03/10/2023, 12:26 PM

## 2023-03-10 NOTE — TOC Initial Note (Addendum)
Transition of Care [facility]) - Initial/Assessment Note    Patient Details  Name: Kristina Martin MRN: [id number] Date of Birth: 1946-04-22  [facility] CM/SW Contact:    Delilah Shan, LCSWA Phone Number: 03/10/2023, 12:42 PM  Clinical Narrative:                  CSW received consult for possible SNF placement at time of discharge. Due to patients current orientation CSW spoke with patients son regarding PT recommendation of SNF placement at time of discharge. Patients son reports PTA patient comes from home with spouse and Lucila [place].Patients son expressed understanding of PT recommendation for patient and is agreeable to SNF placement at time of discharge. Patients son gave CSW permission to fax out initial referral near the [place] area.CSW discussed insurance authorization process and will provide Medicare SNF ratings list with accepted SNF bed offers when available. Patients passr pending. CSW will submit clinicals requested by [place] must for review.   No further questions reported at this time. CSW to continue to follow and assist with discharge planning needs.    Barriers to Discharge: Continued Medical Work up   Patient Goals and CMS Choice   CMS Medicare.gov Compare Post Acute Care list provided to:: Patient Represenative (must comment) ([name]) Choice offered to / list presented to : Adult Children (Patients son)      Expected Discharge Plan and Services In-house Referral: Clinical Social Work     Living arrangements for the past 2 months: Single Family Home                                      Prior Living Arrangements/Services Living arrangements for the past 2 months: Single Family Home Lives with:: Spouse, Other (Comment) (Spouse and Grandson) Patient language and need for interpreter reviewed:: Yes Do you feel safe going back to the place where you live?: No   SNF  Need for Family Participation in Patient Care: Yes (Comment) Care  giver support system in place?: Yes (comment)   Criminal Activity/Legal Involvement Pertinent to Current Situation/Hospitalization: No - Comment as needed  Activities of Daily Living      Permission Sought/Granted Permission sought to share information with : Case Manager, Family Supports, Magazine features editor Permission granted to share information with : No  Share Information with NAME: CSW spoke with [name] due to current orientation  Permission granted to share info w AGENCY: CSW spoke with [name] due to current orientation/SNF  Permission granted to share info w Relationship: CSW spoke with [name] due to current orientation  Permission granted to share info w Contact Information: CSW spoke with [name] due to current orientation [phone]  Emotional Assessment Appearance:: Appears stated age Attitude/Demeanor/Rapport: Gracious Affect (typically observed): Calm Orientation: : Oriented to Self, Oriented to Place, Oriented to  Time Alcohol / Substance Use: Not Applicable Psych Involvement: No (comment)  Admission diagnosis:  Peripheral edema [R60.0] Atrial fibrillation with rapid ventricular response [I48.91] Acute encephalopathy [G93.40] Acute on chronic congestive heart failure, unspecified heart failure type [I50.9] Patient Active Problem List   Diagnosis Date Noted   Depression, major, single episode, severe 03/09/2023   Atrial fibrillation with rapid ventricular response 03/08/2023   Left leg cellulitis 03/08/2023   AKI (acute kidney injury) 03/08/2023   Right renal mass 03/08/2023   Hyperbilirubinemia 03/08/2023   Mitral regurgitation 01/24/2022  Secondary hypercoagulable state 01/05/2022   Acute on chronic diastolic CHF (congestive heart failure)    Persistent atrial fibrillation 12/22/2021   Hypokalemia 12/22/2021   Anemia 12/22/2021   Tobacco abuse 12/22/2021   Fibroid    Hypertension     PCP:  Kaleen Mask, MD Pharmacy:   [facility] [phone] - [place], [place] - [address] [place] [address] Phone: [phone] Fax: [phone]     Social Determinants of Health (SDOH) Social History: SDOH Screenings   Food Insecurity: No Food Insecurity (12/28/2021)  Housing: Low Risk  (12/28/2021)  Transportation Needs: No Transportation Needs (12/28/2021)  Alcohol Screen: Low Risk  (12/28/2021)  Financial Resource Strain: Low Risk  (12/28/2021)  Tobacco Use: Medium Risk (03/08/2023)   SDOH Interventions:     Readmission Risk Interventions     No data to display

## 2023-03-10 NOTE — Progress Notes (Addendum)
RE: Kristina Martin   Date of Birth: [birth year]  Date: 03/10/2023  To Whom It May Concern:  Please be advised that the above-named patient will require a short-term nursing home stay - anticipated 30 days or less for rehabilitation and strengthening. The plan is for return home.

## 2023-03-11 DIAGNOSIS — I4891 Unspecified atrial fibrillation: Secondary | ICD-10-CM | POA: Diagnosis not present

## 2023-03-11 DIAGNOSIS — G9340 Encephalopathy, unspecified: Secondary | ICD-10-CM | POA: Diagnosis not present

## 2023-03-11 DIAGNOSIS — I509 Heart failure, unspecified: Secondary | ICD-10-CM | POA: Diagnosis not present

## 2023-03-11 DIAGNOSIS — I10 Essential (primary) hypertension: Secondary | ICD-10-CM | POA: Diagnosis not present

## 2023-03-11 DIAGNOSIS — R600 Localized edema: Secondary | ICD-10-CM | POA: Diagnosis not present

## 2023-03-11 DIAGNOSIS — I5033 Acute on chronic diastolic (congestive) heart failure: Secondary | ICD-10-CM | POA: Diagnosis not present

## 2023-03-11 DIAGNOSIS — N179 Acute kidney failure, unspecified: Secondary | ICD-10-CM | POA: Diagnosis not present

## 2023-03-11 LAB — BASIC METABOLIC PANEL
Anion gap: 7 (ref 5–15)
BUN: 23 mg/dL (ref 8–23)
CO2: 28 mmol/L (ref 22–32)
Calcium: 8.6 mg/dL — ABNORMAL LOW (ref 8.9–10.3)
Chloride: 99 mmol/L (ref 98–111)
Creatinine, Ser: 0.92 mg/dL (ref 0.44–1.00)
GFR, Estimated: >60 mL/min (ref >60)
Glucose, Bld: 91 mg/dL (ref 70–99)
Potassium: 4.6 mmol/L (ref 3.5–5.1)
Sodium: 134 mmol/L — ABNORMAL LOW (ref 135–145)

## 2023-03-11 MED ORDER — FUROSEMIDE 20 MG PO TABS
20.0000 mg | ORAL_TABLET | Freq: Every day | ORAL | Status: DC
  Administered 2023-03-11 – 2023-03-12 (×2): 20 mg via ORAL
  Filled 2023-03-11 (×2): qty 1

## 2023-03-11 NOTE — Progress Notes (Addendum)
Progress Note   Patient: Kristina Martin DOB: 07/16/1946 DOA: 03/07/2023     3 DOS: the patient was seen and examined on 03/11/2023   Brief hospital course: Kristina Martin was admitted to the hospital with the working diagnosis of heart failure exacerbation.   77 yo female with the past medical history of heart failure, atrial fibrillation, and hypertension who presented with worsening lower extremity edema. Apparently patient stopped taking her medication 2 months ago (at that time her husband died). Reported worsening lower extremity edema, and lately with altered mental status and poor oral intake. On her initial physical examination her blood pressure was 143/78, HR 90, RR 22, and 02 saturation 96%, lungs with no wheezing or rales, heart with S1 and S2 present, irregularly irregular with no gallops, but tachycardic, abdomen with no distention and positive lower extremity edema. Bilateral lower extremity erythema.  Na 136, K 3,3 Cl 94, bicarbonate 28, glucose 100, bun 24 cr 1,24  BNP 604  CRP 2,9, lactic acid 2,2  Wbc 8,3 hgb 15,1 plt 201   Urine analysis SG 1,017, negative protein, negative leukocytes, glucose >500   CT head with no acute changes.  CT chest and abdomen/ pelvis, with centrilobular emphysema, with faint bilateral ground glass opacities. Right kidney lower pole 4,1 cm heterogenous enhancing lesion, consistent with neoplasm (recommendation to consult Urology).  Diffuse mesenteric edema.  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   Chest radiograph with hyperinflation with cardiomegaly, no infiltrates or effusions.   EKG 94 bpm, normal axis, normal qtc, low voltage, atrial fibrillation rhythm with no significant ST segment or T wave changes.   Patient developed RVR and was placed on diltiazem infusion.  Furosemide IV for diuresis.   04/20 patient with improvement in rate control and volume  overload. Plan to transfer to SNF when bed available.  Assessment and Plan: * Acute on chronic diastolic CHF (congestive heart failure) Echocardiogram with preserved LV systolic function with EF 60 to 65%, interventricular septum is flattened in systole and diastole, RV with severe enlargement, moderate reduction in RV systolic function, RVSP 52,5 mmHg. RA with severe dilatation, trivial pericardial effusion, severe TR.   Acute on chronic core pulmonale. Pulmonary hypertension with RV failure.   Systolic blood pressure 108 to 125 mmHg.   Continue heart failure management with metoprolol, spironolactone, dapagliflozin and resume loop diuretic today with furosemide 20 mg po daily.  Patient very weak and deconditioned, poor functional physical status.   Secondary hypercoagulable state Continue Eliquis due to history of A-fib.  Hypokalemia Replete with oral K.  Keep serum potassium greater than 4.0.  Atrial fibrillation with rapid ventricular response Continue metoprolol for rate control.  Anticoagulation with apixaban. Continue telemetry monitoring.   AKI (acute kidney injury) Hyponatremia, hypokalemia.   Renal function has recovered, with serum cr at 0,92 with K at 4,6 and serum bicarbonate at 28, Na is 134 and Mg 2,1.    Hypertension Continue blood pressure control with metoprolol.   Left leg cellulitis Patient has been on ceftriaxone, will transition to oral cephalexin. Patient with no fever or leg pain.  Wraps in place today, but noted erythema to be improving.  Right renal mass I spoke with her son about this incidental finding, will need outpatient follow up with Urology.   Depression, major, single episode, severe Continue with sertraline. Patient very debilitated, plan for SNF at the time of her discharge. Follow up  with palliative care as outpatient.  Will hold on trazodone for now.         Subjective: Patient with eyes closed and not willing to talk much,  when asked she denies chest pain or dyspnea, no leg pain.   Physical Exam: Vitals:   03/10/23 1600 03/10/23 2000 03/10/23 2142 03/11/23 0610  BP: 111/69 127/77 108/62 125/88  Pulse: 82 95 97 91  Resp:  16  18  Temp:  (!) 97.5 F (36.4 C)  97.8 F (36.6 C)  TempSrc:  Oral  Axillary  SpO2: 99% 95%  98%  Weight:    68.6 kg  Height:       Neurology eyes closed, opens to voice and responds simple questions.  ENT with mild pallor Cardiovascular with S1 and S2 present, irregularly irregular with no gallops or rubs No JVD Trace to + lower extremity edema  Respiratory with no rales or rhonchi, no wheezing Abdomen with no distention  Data Reviewed:    Family Communication: no family at the bedside   Disposition: Status is: Inpatient Remains inpatient appropriate because: pending placement SNF   Planned Discharge Destination: Skilled nursing facility      Author: Coralie Keens, MD 03/11/2023 10:01 AM  For on call review [email or web address].

## 2023-03-11 NOTE — Progress Notes (Signed)
Daily Progress Note   Kristina Martin       Date: 03/11/2023 DOB: 1946/05/21  Age: 77 y.o. MRN#: [id number] Attending Physician: Kristina Martin Primary Care Physician: Kristina Mask, MD Admit Date: 03/07/2023  Reason for Consultation/Follow-up: Establishing goals of care  Subjective: I have reviewed medical records including EPIC notes, MAR, and labs. Received report from primary RN - no acute concerns. RN reports patient's oral intake remains poor and son had questions regarding diet - RD consult was previously placed. RN also notes patient's sleep-wake cycle is becoming reversed - trazodone was started last night.  Went to visit patient at bedside for scheduled meeting with son to finalize MOST form per his request. Patient was lying in bed asleep - son requested we not wake her. No signs or non-verbal gestures of pain or discomfort noted. No respiratory distress, increased work of breathing, or secretions noted. She is on 2L O2 [place].   Kristina Martin had an urgent matter with his son and was leaving patient's room upon my arrival - he requests to reschedule and meet tomorrow. He will call PMT number with a time he would like to meet. Kristina Martin did express appreciation for chaplain visit yesterday as well as wonderful nursing care patient has received.  All questions and concerns addressed. Encouraged to call with questions and/or concerns. PMT card provided.  Length of Stay: 3  Current Medications: Scheduled Meds:   apixaban  5 mg Oral BID   cephALEXin  250 mg Oral Q8H   dapagliflozin propanediol  10 mg Oral Daily   furosemide  20 mg Oral Daily   metoprolol tartrate  50 mg Oral BID   sertraline  25 mg Oral Daily   spironolactone  12.5 mg Oral Daily    Continuous  Infusions:   PRN Meds: acetaminophen **OR** acetaminophen, albuterol, ondansetron **OR** ondansetron (ZOFRAN) IV  Physical Exam Vitals and nursing note reviewed.  Constitutional:      General: She is not in acute distress.    Appearance: She is ill-appearing.  Pulmonary:     Effort: No respiratory distress.  Skin:    General: Skin is warm and dry.  Neurological:     Mental Status: She is alert.     Motor: Weakness present.  Psychiatric:        Behavior:  Behavior is cooperative.             Vital Signs: BP 125/88 (BP Location: Right Arm)   Pulse 91   Temp 97.8 F (36.6 C) (Axillary)   Resp 18   Ht  (1.575 m)   Wt 68.6 kg   SpO2 98%   BMI 27.66 kg/m  SpO2: SpO2: 98 % O2 Device: O2 Device: Nasal Cannula O2 Flow Rate: O2 Flow Rate (L/min): 1 L/min  Intake/output summary:  Intake/Output Summary (Last 24 hours) at 03/11/2023 1038 Last data filed at 03/11/2023 9562 Gross per 24 hour  Intake --  Output 580 ml  Net -580 ml   LBM: Last BM Date : 03/10/23 Baseline Weight: Weight: 62 kg Most recent weight: Weight: 68.6 kg       Palliative Assessment/Data: PPS 40%      Patient Active Problem List   Diagnosis Date Noted   Depression, major, single episode, severe 03/09/2023   Atrial fibrillation with rapid ventricular response 03/08/2023   Left leg cellulitis 03/08/2023   AKI (acute kidney injury) 03/08/2023   Right renal mass 03/08/2023   Hyperbilirubinemia 03/08/2023   Mitral regurgitation 01/24/2022   Secondary hypercoagulable state 01/05/2022   Acute on chronic diastolic CHF (congestive heart failure)    Persistent atrial fibrillation 12/22/2021   Hypokalemia 12/22/2021   Anemia 12/22/2021   Tobacco abuse 12/22/2021   Fibroid    Hypertension     Palliative Care Assessment & Plan   Patient Profile: 77 y.o. female with past medical history of diastolic heart failure last Echo March 2023 with EF of 55-60%, COPD, A-fib, HTN admitted on 03/07/2023 with  complaints of progressive weakness, altered mental status, weeping leg edema. Workup revealed a-fib with RVR- started on cardizem drip with rate now improving, lower extremity cellulitis- being treated with antibiotics, heart failure exacerbation- diuresing, and acute kidney injury with Cr at 1.24. CT head with no acute findings. 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 Palliative consult received for "end of life".   Assessment: Principal Problem:   Acute on chronic diastolic CHF (congestive heart failure) Active Problems:   Hypertension   Atrial fibrillation with rapid ventricular response   Left leg cellulitis   AKI (acute kidney injury)   Right renal mass   Depression, major, single episode, severe   Concern about end of life  Recommendations/Plan: Continue full code/full scope Not able to meet with son today for finalization of MOST form - he requests to reschedule meeting for tomorrow 4/21 and will call PMT with a time Goal continues to be for patient's discharge to rehab with outpatient Palliative Care to follow. If patient does not find benefit and/or continues to decline, family open for her transition to hospice care  PMT will continue to follow and support holistically  Goals of Care and Additional Recommendations: Limitations on Scope of Treatment: Full Scope Treatment  Code Status:    Code Status Orders  (From admission, onward)           Start     Ordered   03/08/23 0137  Full code  Continuous       Question:  By:  Answer:  Consent: discussion documented in EHR   03/08/23 0137           Code Status History     Date Active Date Inactive  Code Status Order ID Comments User Context   03/08/2023 0116 03/08/2023 0137 Full Code [id number]  Carollee Herter, DO ED   12/22/2021 1350 12/28/2021 1933 Full Code [id number]   Orland Mustard, MD Inpatient       Prognosis:  Unable to determine  Discharge Planning: Skilled Nursing Facility for rehab with Palliative care service follow-up  Care plan was discussed with primary RN, patient's son  Thank you for allowing the Palliative Medicine Team to assist in the care of this patient.   Total Time 25 minutes Prolonged Time Billed  no       Greater than 50%  of this time was spent counseling and coordinating care related to the above assessment and plan.  Haskel Khan, NP  Please contact Palliative Medicine Team phone at [phone] for questions and concerns.   *Portions of this note are a verbal dictation therefore any spelling and/or grammatical errors are due to the "[employer]" system interpretation.

## 2023-03-11 NOTE — TOC Progression Note (Signed)
Transition of Care (TOC) - Initial/Assessment Note    Patient Details  Name:Kristina Martin MRN: [id number] Date of Birth: [birth year]  [facility] of [facility]) CM/SW Contact:    Ralene Bathe, LCSWA Phone Number: 03/11/2023, 11:14 AM  Clinical Narrative:                 LCSW contacted the patient's son and presented be offers.  The family will review and inform LCSW of facility choice.  PASRR number- [id number]  TOC following.    Barriers to Discharge: Continued Medical Work up   Patient Goals and CMS Choice   CMS Medicare.gov Compare Post Acute Care list provided to:: Patient Represenative (must comment) ([name]) Choice offered to / list presented to : Adult Children (Patients son)      Expected Discharge Plan and Services In-house Referral: Clinical Social Work     Living arrangements for the past 2 months: Single Family Home                                      Prior Living Arrangements/Services Living arrangements for the past 2 months: Single Family Home Lives with:: Spouse, Other (Comment) (Spouse and Grandson) Patient language and need for interpreter reviewed:: Yes Do you feel safe going back to the place where you live?: No   SNF  Need for Family Participation in Patient Care: Yes (Comment) Care giver support system in place?: Yes (comment)   Criminal Activity/Legal Involvement Pertinent to Current Situation/Hospitalization: No - Comment as needed  Activities of Daily Living      Permission Sought/Granted Permission sought to share information with : Case Manager, Family Supports, Magazine features editor Permission granted to share information with : No  Share Information with NAME: CSW spoke with [name] due to current orientation  Permission granted to share info w AGENCY: CSW spoke with [name] due to current orientation/SNF  Permission granted to share info w Relationship: CSW spoke with  [name] due to current orientation  Permission granted to share info w Contact Information: CSW spoke with [name] due to current orientation [phone]  Emotional Assessment Appearance:: Appears stated age Attitude/Demeanor/Rapport: Gracious Affect (typically observed): Calm Orientation: : Oriented to Self, Oriented to Place, Oriented to  Time Alcohol / Substance Use: Not Applicable Psych Involvement: No (comment)  Admission diagnosis:  Peripheral edema [R60.0] Atrial fibrillation with rapid ventricular response [I48.91] Acute encephalopathy [G93.40] Acute on chronic congestive heart failure, unspecified heart failure type [I50.9] Patient Active Problem List   Diagnosis Date Noted   Depression, major, single episode, severe 03/09/2023   Atrial fibrillation with rapid ventricular response 03/08/2023   Left leg cellulitis 03/08/2023   AKI (acute kidney injury) 03/08/2023   Right renal mass 03/08/2023   Hyperbilirubinemia 03/08/2023   Mitral regurgitation 01/24/2022   Secondary hypercoagulable state 01/05/2022   Acute on chronic diastolic CHF (congestive heart failure)    Persistent atrial fibrillation 12/22/2021   Hypokalemia 12/22/2021   Anemia 12/22/2021   Tobacco abuse 12/22/2021   Fibroid    Hypertension    PCP:  Kaleen Mask, MD Pharmacy:   [facility] [phone] - [place], [place] - [address] [facility] [address] Phone: [phone] Fax: [phone]     Social Determinants of Health (SDOH) Social History: SDOH Screenings   Food Insecurity: No Food Insecurity (12/28/2021)  Housing:  Low Risk  (12/28/2021)  Transportation Needs: No Transportation Needs (12/28/2021)  Alcohol Screen: Low Risk  (12/28/2021)  Financial Resource Strain: Low Risk  (12/28/2021)  Tobacco Use: Medium Risk (03/08/2023)   SDOH Interventions:     Readmission Risk Interventions     No data to display

## 2023-03-12 ENCOUNTER — Inpatient Hospital Stay (HOSPITAL_COMMUNITY): Payer: PPO

## 2023-03-12 DIAGNOSIS — I10 Essential (primary) hypertension: Secondary | ICD-10-CM | POA: Diagnosis not present

## 2023-03-12 DIAGNOSIS — N179 Acute kidney failure, unspecified: Secondary | ICD-10-CM | POA: Diagnosis not present

## 2023-03-12 DIAGNOSIS — F322 Major depressive disorder, single episode, severe without psychotic features: Secondary | ICD-10-CM | POA: Diagnosis not present

## 2023-03-12 DIAGNOSIS — I5033 Acute on chronic diastolic (congestive) heart failure: Secondary | ICD-10-CM | POA: Diagnosis not present

## 2023-03-12 LAB — BASIC METABOLIC PANEL
Anion gap: 6 (ref 5–15)
BUN: 21 mg/dL (ref 8–23)
CO2: 29 mmol/L (ref 22–32)
Calcium: 8.5 mg/dL — ABNORMAL LOW (ref 8.9–10.3)
Chloride: 100 mmol/L (ref 98–111)
Creatinine, Ser: 0.82 mg/dL (ref 0.44–1.00)
GFR, Estimated: >60 mL/min (ref >60)
Glucose, Bld: 97 mg/dL (ref 70–99)
Potassium: 4.6 mmol/L (ref 3.5–5.1)
Sodium: 135 mmol/L (ref 135–145)

## 2023-03-12 LAB — CULTURE, BLOOD (ROUTINE X 2): Culture: NO GROWTH

## 2023-03-12 MED ORDER — ADULT MULTIVITAMIN W/MINERALS CH
1.0000 | ORAL_TABLET | Freq: Every day | ORAL | Status: DC
  Administered 2023-03-12 – 2023-03-16 (×4): 1 via ORAL
  Filled 2023-03-12 (×5): qty 1

## 2023-03-12 MED ORDER — FUROSEMIDE 20 MG PO TABS
20.0000 mg | ORAL_TABLET | Freq: Two times a day (BID) | ORAL | Status: DC
  Administered 2023-03-12 – 2023-03-13 (×2): 20 mg via ORAL
  Filled 2023-03-12 (×2): qty 1

## 2023-03-12 MED ORDER — ENSURE ENLIVE PO LIQD
237.0000 mL | Freq: Two times a day (BID) | ORAL | Status: DC
  Administered 2023-03-12 – 2023-03-14 (×5): 237 mL via ORAL

## 2023-03-12 NOTE — Progress Notes (Signed)
Brief Palliative Medicine Progress Note:  Did not receive call from patient's son/William for time to meet and finalize MOST form today. PMT will follow up to see if he would like to complete prior to patient's discharge.   Thank you for allowing PMT to assist in the care of this patient.  Zsazsa Bahena M. Katrinka Blazing [facility] Palliative Medicine Team Team Phone: [phone] NO CHARGE

## 2023-03-12 NOTE — Progress Notes (Signed)
Initial Nutrition Assessment RD working remotely.  DOCUMENTATION CODES:   Not applicable  INTERVENTION:  - ordered Ensure Plus High Protein BID, each supplement provides 350 kcal and 20 grams of protein.  - ordered 1 tablet multivitamin with minerals/day.  - liberalized diet order from [facility], thin liquids, 1.5 L fluid restriction to Regular, thin liquids, 1.5 L fluid restriction due to poor oral intake.   NUTRITION DIAGNOSIS:   Inadequate oral intake related to acute illness, lethargy/confusion as evidenced by other (comment) (information documented within H&P).  GOAL:   Patient will meet greater than or equal to 90% of their needs  MONITOR:   PO intake, Supplement acceptance, Labs, Weight trends, I & O's, Skin  REASON FOR ASSESSMENT:   Consult Assessment of nutrition requirement/status  ASSESSMENT:   77 yo female with medical history of heart failure, afib, and HTN. She presented to the ED due to worsening lower extremity edema, AMS, and poor oral intake. It was reported that patient stopped taking her medication 2 months ago (when her husband died).  Patient noted to be a/o to self only and have delayed responses. No meal completion percentages documented in the flow sheet. RD able to talk with RN via secure chat who shares that patient has not had anything PO this shift though patient's sister is currently visiting and is planning to encourage patient in an attempt to get her to take something in this afternoon.  She has not been assessed by a [place] RD at any time in the past. Weight today is 149 lb and weight has been stable since 03/09/23 though up compared to PTA weights; most recently: 137 lb on 12/14/22.  Per notes: - acute on chronic CHF - hypokalemia on admission s/p repletion - afib with RVR -AKI - L leg cellulitis - R renal mass with recommendation for outpatient follow-up with Urology - Palliative Care following with plan as of 4/20 for patient's  son to meet with Palliative Care at his convenience on 4/21   Labs reviewed; Ca: 8.5 mg/dl.  Medications reviewed; 20 mg oral lasix/day, 12.5 mg aldactone/day.    NUTRITION - FOCUSED PHYSICAL EXAM:  RD working remotely.  Diet Order:   Diet Order             Diet regular Room service appropriate? Yes; Fluid consistency: Thin; Fluid restriction: 1500 mL Fluid  Diet effective now                   EDUCATION NEEDS:   Not appropriate for education at this time  Skin:  Skin Assessment: Skin Integrity Issues: Skin Integrity Issues:: Stage II Stage II: L toe  Last BM:  4/20 (type 6 x2, smear and a small amount)  Height:   Ht Readings from Last 1 Encounters:  03/07/23  (1.575 m)    Weight:   Wt Readings from Last 1 Encounters:  03/12/23 67.7 kg    BMI:  Body mass index is 27.3 kg/m.  Estimated Nutritional Needs:  Kcal:  1600-1800 kcal Protein:  75-85 grams Fluid:  >/= 1.5 L/day     Trenton Gammon, MS, RD, LDN, CNSC Clinical Dietitian PRN/Relief staff On-call/weekend pager # available in [facility]

## 2023-03-12 NOTE — Progress Notes (Signed)
Patient having minimal tea colored urine output during the shift- see flowsheets. This RN notified Gwenlyn Perking, MD- see new orders. Will continue to encourage hydration per provider.

## 2023-03-12 NOTE — Progress Notes (Signed)
PHARMACY - PHYSICIAN COMMUNICATION CRITICAL VALUE ALERT - BLOOD CULTURE IDENTIFICATION (BCID)  Kristina Martin is an 77 y.o. female who presented to [facility] on 03/07/2023 with a chief complaint of HF exacerbation  Assessment:  Pt with 1/4 blood culture bottles growing GPR - likely contaminant  Name of physician (or Provider) Contacted: Dr. Gwenlyn Perking  Current antibiotics: Cephalexin  po q8h (for cellulitis)  Changes to prescribed antibiotics recommended:  No additional abx needed  No results found for this or any previous visit.  Christoper Fabian, PharmD, BCPS Please see amion for complete clinical pharmacist phone list 03/12/2023  4:45 PM

## 2023-03-12 NOTE — Progress Notes (Signed)
Progress Note   Kristina Martin: Kristina Martin [name] DOB: [birth year] DOA: 03/07/2023     4 DOS: the Kristina Martin was seen and examined on 03/12/2023   Brief hospital course: Kristina Martin was admitted to the hospital with the working diagnosis of heart failure exacerbation.   77 yo female with the past medical history of heart failure, atrial fibrillation, and hypertension who presented with worsening lower extremity edema. Apparently Kristina Martin stopped taking her medication 2 months ago (at that time her husband died). Reported worsening lower extremity edema, and lately with altered mental status and poor oral intake. On her initial physical examination her blood pressure was 143/78, HR 90, RR 22, and 02 saturation 96%, lungs with no wheezing or rales, heart with S1 and S2 present, irregularly irregular with no gallops, but tachycardic, abdomen with no distention and positive lower extremity edema. Bilateral lower extremity erythema.  Na 136, K 3,3 Cl 94, bicarbonate 28, glucose 100, bun 24 cr 1,24  BNP 604  CRP 2,9, lactic acid 2,2  Wbc 8,3 hgb 15,1 plt 201   Urine analysis SG 1,017, negative protein, negative leukocytes, glucose >500   CT head with no acute changes.  CT chest and abdomen/ pelvis, with centrilobular emphysema, with faint bilateral ground glass opacities. Right kidney lower pole 4,1 cm heterogenous enhancing lesion, consistent with neoplasm (recommendation to consult Urology).  Diffuse mesenteric edema.  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   Chest radiograph with hyperinflation with cardiomegaly, no infiltrates or effusions.   EKG 94 bpm, normal axis, normal qtc, low voltage, atrial fibrillation rhythm with no significant ST segment or T wave changes.   Kristina Martin developed RVR and was placed on diltiazem infusion.  Furosemide IV for diuresis.   04/20 Kristina Martin with improvement in rate control and volume  overload. Plan to transfer to SNF when bed available.  04/21: Continue to wean off oxygen as Kristina Martin; diuresis dose has been adjusted and following discussion with Kristina Martin's sister CT scan of the head to rule out acute CVA has been ordered.  Assessment and Plan: * Acute on chronic diastolic CHF (congestive heart failure) -Echocardiogram with preserved LV systolic function with EF 60 to 65%, interventricular septum is flattened in systole and diastole, RV with severe enlargement, moderate reduction in RV systolic function, RVSP 52,5 mmHg. RA with severe dilatation, trivial pericardial effusion, severe TR.  -Acute on chronic core pulmonale/RV failure. -continue to follow daily weight, low sodium diet and diuretics. -wean off oxygen supplementation as able. -Continue heart failure management with metoprolol, spironolactone, dapagliflozin. -Continue to follow daily weights and strict intake and output.  Secondary hypercoagulable state Continue Eliquis due to history of A-fib.  Hypokalemia Replete with oral K.  Keep serum potassium greater than 4.0.  Atrial fibrillation with rapid ventricular response -Continue metoprolol for rate control.  -Continue anticoagulation with apixaban. -Continue telemetry monitoring.  -Follow electrolytes with goal to keep potassium above 4 and magnesium above 2.  AKI (acute kidney injury) -With hyponatremia, hypokalemia.  -Continue to maintain adequate hydration -Follow-up renal function trend/stability -Replete electrolytes as needed -Kristina Martin advised to maintain adequate oral intake.  Hypertension -Continue blood pressure control with metoprolol.  -Continue to follow vital signs.  Left leg cellulitis -Kristina Martin has been afebrile, with normal WBCs -Leg wraps in place demonstrated improvement in all vital erythematosus changes. -Continue oral Keflex about of antibiotic therapy for cellulitis.  Right renal mass -Continue outpatient follow-up with  urology service.  Depression, major, single episode, severe -Continue with sertraline. Kristina Martin very debilitated, weak and deconditioned. -Planning for SNF at the time of her discharge. -continue holding trazodone.   Subjective: Per family member at bedside Kristina Martin having difficulty finding words and having concerns for underlying stroke given her hx of A. Fib and med non-compliance prior to admission.  Chronically ill in appearance, weak/deconditioned and reporting poor appetite.  Physical Exam: Vitals:   03/11/23 2207 03/12/23 0524 03/12/23 0900 03/12/23 1549  BP: 136/80 123/80 (!) 133/94 (!) 120/90  Pulse: 93 (!) 101 (!) 106 88  Resp:  Temp:  98 F (36.7 C) 98.2 F (36.8 C) 98.4 F (36.9 C)  TempSrc:  Axillary Axillary Oral  SpO2:  96% 100% 97%  Weight:  67.7 kg    Height:       General exam: Alert, awake, oriented x 2 (person and place); no chest pain, no nausea, no vomiting.  Decreased oral intake reported. Respiratory system: Scattered rhonchi appreciated bilaterally; no using accessory muscles and demonstrating good saturation on 1.5 L nasal cannula supplementation. Cardiovascular system: No rubs, no gallops; Irregular rhythm appreciated on exam. Gastrointestinal system: Abdomen is nondistended, soft and nontender. No organomegaly or masses felt. Normal bowel sounds heard. Central nervous system: Able to do simple commands; Kristina Martin's sister at bedside expressing some incoherence, difficulty following conversation and Finding the right word. Extremities: No cyanosis; trace edema appreciated bilaterally. Skin: No petechiae. Psychiatry: Flat affect appreciated; judgment and insight appear to be appropriate.  Data Reviewed: Basic metabolic panel: Sodium 135, potassium 4.6, chloride 100, bicarb 29, BUN 21 and creatinine 0.82.  GFR more than 60.   Family Communication: no family at the bedside   Disposition: Status is: Inpatient  Remains inpatient appropriate  because: pending placement SNF    Planned Discharge Destination: Skilled nursing facility    Author: Vassie Loll, MD 03/12/2023 4:27 PM  For on call review [email or web address].

## 2023-03-13 ENCOUNTER — Ambulatory Visit: Payer: PPO | Admitting: Physician Assistant

## 2023-03-13 DIAGNOSIS — Z515 Encounter for palliative care: Secondary | ICD-10-CM | POA: Diagnosis not present

## 2023-03-13 DIAGNOSIS — I10 Essential (primary) hypertension: Secondary | ICD-10-CM | POA: Diagnosis not present

## 2023-03-13 DIAGNOSIS — I5033 Acute on chronic diastolic (congestive) heart failure: Secondary | ICD-10-CM | POA: Diagnosis not present

## 2023-03-13 DIAGNOSIS — G9340 Encephalopathy, unspecified: Secondary | ICD-10-CM | POA: Diagnosis not present

## 2023-03-13 DIAGNOSIS — I4891 Unspecified atrial fibrillation: Secondary | ICD-10-CM | POA: Diagnosis not present

## 2023-03-13 DIAGNOSIS — N179 Acute kidney failure, unspecified: Secondary | ICD-10-CM | POA: Diagnosis not present

## 2023-03-13 LAB — BASIC METABOLIC PANEL
Anion gap: 9 (ref 5–15)
BUN: 23 mg/dL (ref 8–23)
CO2: 30 mmol/L (ref 22–32)
Calcium: 8.9 mg/dL (ref 8.9–10.3)
Chloride: 98 mmol/L (ref 98–111)
Creatinine, Ser: 1.04 mg/dL — ABNORMAL HIGH (ref 0.44–1.00)
GFR, Estimated: 55 mL/min — ABNORMAL LOW (ref >60)
Glucose, Bld: 96 mg/dL (ref 70–99)
Potassium: 4.7 mmol/L (ref 3.5–5.1)
Sodium: 137 mmol/L (ref 135–145)

## 2023-03-13 MED ORDER — FUROSEMIDE 20 MG PO TABS
20.0000 mg | ORAL_TABLET | Freq: Every day | ORAL | Status: DC
  Administered 2023-03-14: 20 mg via ORAL
  Filled 2023-03-13: qty 1

## 2023-03-13 NOTE — Plan of Care (Signed)

## 2023-03-13 NOTE — Progress Notes (Signed)
Progress Note   Patient: Kristina Martin [name] DOB: 03-22-1946 DOA: 03/07/2023     5 DOS: the patient was seen and examined on 03/13/2023   Brief hospital course: Mrs. Kristina Martin was admitted to the hospital with the working diagnosis of heart failure exacerbation.   77 yo female with the past medical history of heart failure, atrial fibrillation, and hypertension who presented with worsening lower extremity edema. Apparently patient stopped taking her medication 2 months ago (at that time her husband died). Reported worsening lower extremity edema, and lately with altered mental status and poor oral intake. On her initial physical examination her blood pressure was 143/78, HR 90, RR 22, and 02 saturation 96%, lungs with no wheezing or rales, heart with S1 and S2 present, irregularly irregular with no gallops, but tachycardic, abdomen with no distention and positive lower extremity edema. Bilateral lower extremity erythema.  Na 136, K 3,3 Cl 94, bicarbonate 28, glucose 100, bun 24 cr 1,24  BNP 604  CRP 2,9, lactic acid 2,2  Wbc 8,3 hgb 15,1 plt 201   Urine analysis SG 1,017, negative protein, negative leukocytes, glucose >500   CT head with no acute changes.  CT chest and abdomen/ pelvis, with centrilobular emphysema, with faint bilateral ground glass opacities. Right kidney lower pole 4,1 cm heterogenous enhancing lesion, consistent with neoplasm (recommendation to consult Urology).  Diffuse mesenteric edema.  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   Chest radiograph with hyperinflation with cardiomegaly, no infiltrates or effusions.   EKG 94 bpm, normal axis, normal qtc, low voltage, atrial fibrillation rhythm with no significant ST segment or T wave changes.   Patient developed RVR and was placed on diltiazem infusion.  Furosemide IV for diuresis.   04/20 patient with improvement in rate control and volume  overload. Plan to transfer to SNF when bed available.  04/21: Continue to wean off oxygen as patient; diuresis dose has been adjusted and following discussion with patient's sister CT scan of the head to rule out acute CVA has been ordered. 04/22. Patient is more awake and alert today, continue very weak and deconditioned. Head CT negative for acute changes.  Stable for SNF, pending placement.   Assessment and Plan: * Acute on chronic diastolic CHF (congestive heart failure) Echocardiogram with preserved LV systolic function with EF 60 to 65%, interventricular septum is flattened in systole and diastole, RV with severe enlargement, moderate reduction in RV systolic function, RVSP 52,5 mmHg. RA with severe dilatation, trivial pericardial effusion, severe TR.   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  Urine output is not documented.  Systolic blood pressure is 120 to 117 mmHg.   Continue heart failure management with metoprolol, spironolactone, dapagliflozin. Resume furosemide 20 mg po daily.    Secondary hypercoagulable state Continue Eliquis due to history of A-fib.  Hypokalemia Replete with oral K.  Keep serum potassium greater than 4.0.  Atrial fibrillation with rapid ventricular response -Continue metoprolol for rate control.  -Continue anticoagulation with apixaban. -Continue telemetry monitoring.  -Follow electrolytes with goal to keep potassium above 4 and magnesium above 2.  AKI (acute kidney injury) Clinically with improved volume status.   Renal function with serum cr at 1,0 with K at 4,7 and serum bicarbonate at 30, Na 137,   Plan to continue diuresis, decreased furosemide to once daily. Follow up renal function in am.     Hypertension -Continue blood pressure control with metoprolol.  Continue diuresis.   Left leg cellulitis -Patient has been afebrile, with normal WBCs -Leg wraps in place demonstrated improvement in all vital erythematosus  changes. -Continue oral Keflex about of antibiotic therapy for cellulitis.  Right renal mass -Continue outpatient follow-up with urology service. Her son is aware of potential malignancy, and need to follow up as outpatient. Currently patient very deconditioned for further work up.   Depression, major, single episode, severe -Continue with sertraline. Today more awake and reactive.  Patient very debilitated, weak and deconditioned. -Planning for SNF at the time of her discharge.        Subjective: Patient with no chest pain or dyspnea, she is more awake and alert.   Physical Exam: Vitals:   03/13/23 0009 03/13/23 0348 03/13/23 0458 03/13/23 0911  BP: (!) 137/95 120/82  117/80  Pulse: (!) 108 (!) 108  (!) 105  Resp: Temp: (!) 97.2 F (36.2 C) 98.1 F (36.7 C)    TempSrc: Oral Axillary    SpO2: 95% 97%  90%  Weight:   67.1 kg   Height:       Neurology awake and alert ENT with mild pallor Cardiovascular with S1 and S2 present, irregularly irregular with no gallops, rubs or murmurs\ No JVD Trace lower extremity edema Respiratory with prolonged expiratory phase with scattered rhonchi with no wheezing,  Abdomen soft and non tender  Data Reviewed:    Family Communication: I spoke with patient's son at the bedside, we talked in detail about patient's condition, plan of care and prognosis and all questions were addressed.   Disposition: Status is: Inpatient Remains inpatient appropriate because: heart failure and cellulitis, patient stable for SNF   Planned Discharge Destination: Skilled nursing facility      Author: Coralie Keens, MD 03/13/2023 11:36 AM  For on call review [email or web address].

## 2023-03-13 NOTE — TOC Progression Note (Addendum)
Transition of Care [facility]) - Progression Note    Patient Details  Name: Kristina Martin MRN: [id number] Date of Birth: 06/25/1946  [name] CM/SW Contact  Delilah Shan, LCSWA Phone Number: 03/13/2023, 11:03 AM  Clinical Narrative:     CSW spoke with patients son regarding SNF bed offers. Patients son accepted SNF bed offer with [facility] CSW received consult/palliaitve referral  for palliative services to follow patient at SNF. CSW spoke with patients son. Patients son would like someone from palliative team to speak with him to assist with questions regarding palliative services for patient. CSW LVM for palliative team. CSW awaiting call back to see if they can give patients son a call to help assist with his palliative questions. CSW spoke with [place] with [facility] who confirmed SNF bed for patient.CSW called Junious Dresser with [facility] and started insurance authorization for SNF and PTAR. CSW will continue to follow and assist with patients dc planning needs.  Update- CSW spoke with Joselyn with Palliative who informed CSW all questions answered for patients son. CSW spoke with patients son who gave CSW permission to make referral to [facility] for palliative services to follow patient at SNF . CSW LVM for [facility]. CSW awaiting call back to make referral for patient.All questions answered. No further questions reported at this time.  Update- CSW made referral to [facility] with [facility] for palliative services to follow patient at SNF.    Barriers to Discharge: Continued Medical Work up  Expected Discharge Plan and Services In-house Referral: Clinical Social Work     Living arrangements for the past 2 months: Single Family Home                                       Social Determinants of Health (SDOH) Interventions SDOH Screenings   Food Insecurity: No Food Insecurity (12/28/2021)  Housing: Low Risk  (12/28/2021)  Transportation Needs: No Transportation Needs  (12/28/2021)  Alcohol Screen: Low Risk  (12/28/2021)  Financial Resource Strain: Low Risk  (12/28/2021)  Tobacco Use: Medium Risk (03/08/2023)    Readmission Risk Interventions     No data to display

## 2023-03-13 NOTE — Progress Notes (Signed)
Daily Progress Note   Patient Name: Kristina Martin       Date: 03/13/2023 DOB: [birth year]  Age: 77 y.o. MRN#: [id number] Attending Physician: Coralie Keens Primary Care Physician: Kaleen Mask, MD Admit Date: 03/07/2023  Reason for Consultation/Follow-up: Establishing goals of care  Subjective: I have reviewed medical records including progress notes, imaging and labs.  Received voicemail from [facility] with update that patient's son wished to to further discuss outpatient palliative care referral.  Patient assessed at the bedside.  She is alert, confused.  Her son Kristina Martin is present visiting.  Her daughter-in-law is also participating via phone.  Reviewed outpatient palliative care services in the outpatient setting.  Reviewed patient's high risk for further decline and the importance of ongoing goals of care discussions based on her progress or lack thereof with rehab.  [name] family verbalized their understanding and agreement.  Family is worried about patient's oral intake and wonders if something else could be tried besides applesauce and pudding.  I assisted with calling in a lunch order to dietary services.  Discussed with RN.  All questions and concerns addressed. Encouraged to call with questions and/or concerns. PMT card provided.  Length of Stay: 5   Physical Exam Vitals and nursing note reviewed.  Constitutional:      General: She is not in acute distress.    Appearance: She is ill-appearing.  Cardiovascular:     Rate and Rhythm: Normal rate.  Pulmonary:     Effort: No respiratory distress.  Skin:    General: Skin is warm and dry.  Neurological:     Mental Status: She is alert.     Motor: Weakness present.  Psychiatric:        Behavior: Behavior is  cooperative.            Vital Signs: BP (!) 143/86 (BP Location: Left Arm)   Pulse 94   Temp 97.6 F (36.4 C) (Axillary)   Resp 17   Ht  (1.575 m)   Wt 67.1 kg   SpO2 99%   BMI 27.07 kg/m  SpO2: SpO2: 99 % O2 Device: O2 Device: Nasal Cannula O2 Flow Rate: O2 Flow Rate (L/min): 2 L/min       Palliative Assessment/Data: PPS 40%   Palliative Care Assessment & Plan   Patient Profile:  77 y.o. female  with past medical history of diastolic heart failure last Echo March 2023 with EF of 55-60%, COPD, A-fib, HTN admitted on 03/07/2023 with complaints of progressive weakness, altered mental status, weeping leg edema. Workup revealed a-fib with RVR- started on cardizem drip with rate now improving, lower extremity cellulitis- being treated with antibiotics, heart failure exacerbation- diuresing, and acute kidney injury with Cr at 1.24. CT head with no acute findings. 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 Palliative consult received for "end of life".   Assessment: Principal Problem:   Acute on chronic diastolic CHF (congestive heart failure) Active Problems:   Hypertension   Atrial fibrillation with rapid ventricular response   Left leg cellulitis   AKI (acute kidney injury)   Right renal mass   Depression, major, single episode, severe   Concern about end of life  Recommendations/Plan: Continue full code/full scope Goal remains trial of rehab with the hope of patient's improvement, transition to hospice if she does not [name] family remain agreeable to outpatient palliative care referral, TOC assistance is appreciated Psychosocial and emotional support provided PMT will continue to follow and support as needed  Prognosis:  Guarded, high risk for further decline  Discharge Planning: Skilled Nursing Facility for  rehab with Palliative care service follow-up  Care plan was discussed with primary RN, patient's son, DIL, TOC   MDM: High   Kojo Liby Loleta Rose Palliative Medicine Team Team phone # [phone]  Thank you for allowing the Palliative Medicine Team to assist in the care of this patient. Please utilize secure chat with additional questions, if there is no response within 30 minutes please call the above phone number.  Palliative Medicine Team providers are available by phone from 7am to 7pm daily and can be reached through the team cell phone.  Should this patient require assistance outside of these hours, please call the patient's attending physician.  Portions of this note are a verbal dictation therefore any spelling and/or grammatical errors are due to the "[employer]" system interpretation.

## 2023-03-13 NOTE — Progress Notes (Signed)
Physical Therapy Treatment Patient Details Name: Kristina Martin MRN: [id number] DOB: July 14, 1946 Today's Date: 03/13/2023   History of Present Illness Patient is a 77 year old female with complaints of progressive weakness, AMS, weeping leg edema. Found to have A-fib with RVR, LE cellulitis, and acute on chronic heart failure. PMH - chf, htn, afib, depression.    PT Comments    Pt remains very lethargic and not consistently following commands. Unable to get past sitting EOB due to pt not able to process how to initiate sit to stand. Will need mental status to improve in order to progress with therapy.    Recommendations for follow up therapy are one component of a multi-disciplinary discharge planning process, led by the attending physician.  Recommendations may be updated based on patient status, additional functional criteria and insurance authorization.  Follow Up Recommendations  Can patient physically be transported by private vehicle: No    Assistance Recommended at Discharge Frequent or constant Supervision/Assistance  Patient can return home with the following Two people to help with walking and/or transfers;A lot of help with bathing/dressing/bathroom;Assistance with cooking/housework;Help with stairs or ramp for entrance;Assist for transportation   Equipment Recommendations  Other (comment) (To be determined at next venue)    Recommendations for Other Services       Precautions / Restrictions Precautions Precautions: Fall     Mobility  Bed Mobility Overal bed mobility: Needs Assistance Bed Mobility: Supine to Sit, Sit to Sidelying     Supine to sit: Max assist   Sit to sidelying: Max assist General bed mobility comments: Assist to bring legs off of bed, elevate trunk into sitting, and bring hips to EOB. Assist to lower trunk and bring legs back up into bed.    Transfers                   General transfer comment: Attempted to stand x 3 but pt not  initiating any attempt to stand despite verbal and tactile cues.    Ambulation/Gait                   Stairs             Wheelchair Mobility    Modified Rankin (Stroke Patients Only)       Balance Overall balance assessment: Needs assistance Sitting-balance support: Feet unsupported, Bilateral upper extremity supported Sitting balance-Leahy Scale: Poor Sitting balance - Comments: Initial min assist for static sitting improving to min guard with intermittent min Postural control: Posterior lean                                  Cognition Arousal/Alertness: Lethargic Behavior During Therapy: Flat affect Overall Cognitive Status: Difficult to assess Area of Impairment: Orientation, Problem solving, Following commands                 Orientation Level: Disoriented to, Time, Situation     Following Commands: Follows one step commands inconsistently, Follows one step commands with increased time     Problem Solving: Slow processing, Decreased initiation, Difficulty sequencing, Requires verbal cues, Requires tactile cues General Comments: Followed some 1 step commands but unable to process major motor functions ie. stand up (no initiation), sit up        Exercises      General Comments General comments (skin integrity, edema, etc.): VSS on 2L O2      Pertinent Vitals/Pain Pain  Assessment Pain Assessment: Faces Faces Pain Scale: No hurt    Home Living                          Prior Function            PT Goals (current goals can now be found in the care plan section) Acute Rehab PT Goals Patient Stated Goal: Pt unable. Family not present Progress towards PT goals: Not progressing toward goals - comment (lethargy)    Frequency    Min 1X/week      PT Plan Current plan remains appropriate    Co-evaluation              AM-PAC PT "6 Clicks" Mobility   Outcome Measure  Help needed turning from your back  to your side while in a flat bed without using bedrails?: A Lot Help needed moving from lying on your back to sitting on the side of a flat bed without using bedrails?: A Lot Help needed moving to and from a bed to a chair (including a wheelchair)?: Total Help needed standing up from a chair using your arms (e.g., wheelchair or bedside chair)?: Total Help needed to walk in hospital room?: Total Help needed climbing 3-5 steps with a railing? : Total 6 Click Score: 8    End of Session Equipment Utilized During Treatment: Oxygen Activity Tolerance: Patient limited by lethargy Patient left: in bed;with call bell/phone within reach;with bed alarm set Nurse Communication: Mobility status PT Visit Diagnosis: Muscle weakness (generalized) (M62.81);Unsteadiness on feet (R26.81)     Time: 4540-9811 PT Time Calculation (min) (ACUTE ONLY): 18 min  Charges:  $Therapeutic Activity: 8-22 mins                     [facility] PT Acute Rehabilitation Services Office [phone]    Angelina Ok [facility] 03/13/2023, 6:42 PM

## 2023-03-13 NOTE — Progress Notes (Signed)
   03/13/23 0911  Assess: MEWS Score  BP 117/80  MAP (mmHg) 92  Pulse Rate (!) 105  ECG Heart Rate (!) 115  Resp 16  Level of Consciousness Responds to Voice  SpO2 90 %  O2 Device Nasal Cannula  O2 Flow Rate (L/min) 2 L/min  Assess: MEWS Score  MEWS Temp 0  MEWS Systolic 0  MEWS Pulse 2  MEWS RR 0  MEWS LOC 1  MEWS Score 3  MEWS Score Color Yellow  Assess: if the MEWS score is Yellow or Red  Were vital signs taken at a resting state? Yes  Focused Assessment No change from prior assessment  Does the patient meet 2 or more of the SIRS criteria? No  MEWS guidelines implemented  Yes, yellow  Treat  MEWS Interventions Considered administering scheduled or prn medications/treatments as ordered  Take Vital Signs  Increase Vital Sign Frequency  Yellow: Q2hr x1, continue Q4hrs until patient remains green for 12hrs  Escalate  MEWS: Escalate Yellow: Discuss with charge nurse and consider notifying provider and/or RRT  Notify: Charge Nurse/RN  Name of Charge Nurse/RN Notified Engineering geologist  Assess: SIRS CRITERIA  SIRS Temperature  0  SIRS Pulse 1  SIRS Respirations  0  SIRS WBC 0  SIRS Score Sum  1

## 2023-03-14 DIAGNOSIS — N179 Acute kidney failure, unspecified: Secondary | ICD-10-CM | POA: Diagnosis not present

## 2023-03-14 DIAGNOSIS — I10 Essential (primary) hypertension: Secondary | ICD-10-CM | POA: Diagnosis not present

## 2023-03-14 DIAGNOSIS — I4891 Unspecified atrial fibrillation: Secondary | ICD-10-CM | POA: Diagnosis not present

## 2023-03-14 DIAGNOSIS — I5033 Acute on chronic diastolic (congestive) heart failure: Secondary | ICD-10-CM | POA: Diagnosis not present

## 2023-03-14 LAB — BASIC METABOLIC PANEL
Anion gap: 11 (ref 5–15)
Anion gap: 12 (ref 5–15)
BUN: 29 mg/dL — ABNORMAL HIGH (ref 8–23)
BUN: 30 mg/dL — ABNORMAL HIGH (ref 8–23)
CO2: 29 mmol/L (ref 22–32)
CO2: 29 mmol/L (ref 22–32)
Calcium: 9.2 mg/dL (ref 8.9–10.3)
Calcium: 9.5 mg/dL (ref 8.9–10.3)
Chloride: 97 mmol/L — ABNORMAL LOW (ref 98–111)
Chloride: 97 mmol/L — ABNORMAL LOW (ref 98–111)
Creatinine, Ser: 1.04 mg/dL — ABNORMAL HIGH (ref 0.44–1.00)
Creatinine, Ser: 1.03 mg/dL — ABNORMAL HIGH (ref 0.44–1.00)
GFR, Estimated: 55 mL/min — ABNORMAL LOW (ref >60)
GFR, Estimated: 56 mL/min — ABNORMAL LOW (ref >60)
Glucose, Bld: 92 mg/dL (ref 70–99)
Glucose, Bld: 88 mg/dL (ref 70–99)
Potassium: 5.3 mmol/L — ABNORMAL HIGH (ref 3.5–5.1)
Potassium: 5 mmol/L (ref 3.5–5.1)
Sodium: 137 mmol/L (ref 135–145)
Sodium: 138 mmol/L (ref 135–145)

## 2023-03-14 MED ORDER — ADULT MULTIVITAMIN W/MINERALS CH: 1.0000 | ORAL_TABLET | Freq: Every day | ORAL | 0 refills | Status: DC

## 2023-03-14 MED ORDER — SERTRALINE HCL 25 MG PO TABS: 25.0000 mg | ORAL_TABLET | Freq: Every day | ORAL | 0 refills | Status: DC

## 2023-03-14 MED ORDER — SODIUM ZIRCONIUM CYCLOSILICATE 10 G PO PACK: 10.0000 g | PACK | Freq: Once | ORAL | Status: DC

## 2023-03-14 MED ORDER — ENSURE ENLIVE PO LIQD: 237.0000 mL | Freq: Two times a day (BID) | ORAL | 0 refills | Status: DC

## 2023-03-14 MED ORDER — FUROSEMIDE 20 MG PO TABS: 20.0000 mg | ORAL_TABLET | Freq: Every day | ORAL | 0 refills | Status: AC

## 2023-03-14 MED ORDER — METOPROLOL SUCCINATE ER 100 MG PO TB24: 100.0000 mg | ORAL_TABLET | Freq: Every day | ORAL | 0 refills | Status: DC

## 2023-03-14 MED ORDER — SODIUM ZIRCONIUM CYCLOSILICATE 10 G PO PACK
10.0000 g | PACK | Freq: Once | ORAL | Status: AC
  Administered 2023-03-14: 10 g via ORAL
  Filled 2023-03-14: qty 1

## 2023-03-14 MED ORDER — METOPROLOL SUCCINATE ER 100 MG PO TB24
100.0000 mg | ORAL_TABLET | Freq: Every day | ORAL | Status: DC
  Administered 2023-03-14 – 2023-03-17 (×4): 100 mg via ORAL
  Filled 2023-03-14 (×4): qty 1

## 2023-03-14 NOTE — Discharge Summary (Addendum)
Physician Discharge Summary   Patient: Kristina Martin MRN: [id number] DOB: 26-May-1946  Admit date:     03/07/2023  Discharge date: 03/14/23  Discharge Physician: York Ram Mozell Hardacre   PCP: Kaleen Mask, MD   Recommendations at discharge:    Patient will continue diuresis with furosemide and SGLT 2 inh Holding on spironolactone due to hyperkalemia. Continue rate control atrial fibrillation with metoprolol succinate 100 mg po daily.  Patient very weak and deconditioned.  Follow up renal function and electrolytes in 7 days. Follow up with Dr Jeannetta Nap in 7 to 10 days.   Discharge Diagnoses: Principal Problem:   Acute on chronic diastolic CHF (congestive heart failure) Active Problems:   Atrial fibrillation with rapid ventricular response   AKI (acute kidney injury)   Hypertension   Left leg cellulitis   Right renal mass   Depression, major, single episode, severe  Resolved Problems:   * No resolved hospital problems. [facility] Course: Kristina Martin was admitted to the hospital with the working diagnosis of heart failure exacerbation.   77 yo female with the past medical history of heart failure, atrial fibrillation, and hypertension who presented with worsening lower extremity edema. Apparently patient stopped taking her medication 2 months ago (at that time her husband died). Reported worsening lower extremity edema, and lately with altered mental status and poor oral intake. On her initial physical examination her blood pressure was 143/78, HR 90, RR 22, and 02 saturation 96%, lungs with no wheezing or rales, heart with S1 and S2 present, irregularly irregular with no gallops, but tachycardic, abdomen with no distention and positive lower extremity edema. Bilateral lower extremity erythema.  Na 136, K 3,3 Cl 94, bicarbonate 28, glucose 100, bun 24 cr 1,24  BNP 604  CRP 2,9, lactic acid 2,2  Wbc 8,3 hgb 15,1 plt 201   Urine analysis SG 1,017, negative protein, negative  leukocytes, glucose >500   CT head with no acute changes.  CT chest and abdomen/ pelvis, with centrilobular emphysema, with faint bilateral ground glass opacities. Right kidney lower pole 4,1 cm heterogenous enhancing lesion, consistent with neoplasm (recommendation to consult Urology).  Diffuse mesenteric edema.  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   Chest radiograph with hyperinflation with cardiomegaly, no infiltrates or effusions.   EKG 94 bpm, normal axis, normal qtc, low voltage, atrial fibrillation rhythm with no significant ST segment or T wave changes.   Patient developed RVR and was placed on diltiazem infusion.  Furosemide IV for diuresis.   04/20 patient with improvement in rate control and volume overload. Plan to transfer to SNF when bed available.  04/21: Continue to wean off oxygen as patient; diuresis dose has been adjusted and following discussion with patient's sister CT scan of the head to rule out acute CVA has been ordered.  04/22. Patient is more awake and alert today, continue very weak and deconditioned. Head CT negative for acute changes.  Stable for SNF, pending placement.   04/23 patient hemodynamically stable for discharge. She is very weak and deconditioned. Pending repeat K levels before discharge.    Assessment and Plan: * Acute on chronic diastolic CHF (congestive heart failure) Echocardiogram with preserved LV systolic function with EF 60 to 65%, interventricular septum is flattened in systole and diastole, RV with severe enlargement, moderate reduction in RV systolic function, RVSP 52,5 mmHg. RA with severe dilatation, trivial pericardial effusion, severe TR.  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  Patient was placed on furosemide for diuresis, negative fluid balance was achieved.  Patient did require Iv bolus fluid for over diuresis.   Continue heart failure  management with metoprolol, furosemide and dapagliflozin. Will hold on spironolactone due to hyperkalemia.  Patient with very poor physical functional status, very poor prognosis.  Transfer to SNF for more physical therapy.   Atrial fibrillation with rapid ventricular response Patient required IV diltiazem for rate control.  She was transitioned to oral metoprolol with good toleration.   Currently she continue in atrial fibrillation rhythm with rate in the 100's. Continue anticoagulation with apixaban.   AKI (acute kidney injury) Hypo-Hyperkalemia and hyponatremia.   At the time of her discharge her serum cr is 1,0 with K at 5,3 and serum bicarbonate at 29. Na 137 and BUN 29.Marland Kitchen   Patient will get sodium zirconium one dose of 10 g and follow up K this pm. If K improved, patient will be transferred to SNF. Continue diuresis with furosemide and empagliflozin.  Follow up renal function and electrolytes in 7 days as outpatient.     Hypertension -Continue blood pressure control with metoprolol.  Continue diuresis.   Left leg cellulitis -Patient has been afebrile, with normal WBCs -Leg wraps in place demonstrated improvement in all vital erythematosus changes.  Patient initially was placed on Iv ceftriaxone and then transitioned to Keflex about of antibiotic therapy for cellulitis. Completed therapy during her hospitalization.   Right renal mass -Continue outpatient follow-up with urology service. Her son is aware of potential malignancy, and need to follow up as outpatient. Currently patient very deconditioned for further work up.   Depression, major, single episode, severe -Continue with sertraline.  Patient very debilitated, weak and deconditioned. -Planning for SNF at the time of her discharge.         Consultants: none  Procedures performed: none   Disposition: Skilled nursing facility Diet recommendation:  Discharge Diet Orders (From admission, onward)      Start     Ordered   03/14/23 0000  Diet - low sodium heart healthy        03/14/23 1223           Cardiac diet DISCHARGE MEDICATION: Allergies as of 03/14/2023   No Known Allergies      Medication List     STOP taking these medications    potassium chloride SA 20 MEQ tablet Commonly known as: KLOR-CON M   spironolactone 25 MG tablet Commonly known as: ALDACTONE       TAKE these medications    acetaminophen 325 MG tablet Commonly known as: TYLENOL Take 650-975 mg by mouth every 4 (four) hours as needed (pain).   albuterol 108 (90 Base) MCG/ACT inhaler Commonly known as: VENTOLIN HFA Inhale 2 puffs into the lungs every 6 (six) hours as needed for shortness of breath.   CALCIUM + D PO Take 1 tablet by mouth daily.   Eliquis 5 MG Tabs tablet Generic drug: apixaban Take 5 mg by mouth in the morning and at bedtime.   Farxiga 10 MG Tabs tablet Generic drug: dapagliflozin propanediol Take 1 tablet (10 mg total) by mouth daily.   feeding supplement Liqd Take 237 mLs by mouth 2 (two) times daily between meals.   ferrous sulfate 324 MG Tbec Take 324 mg by mouth daily with breakfast.   furosemide 20 MG tablet Commonly known as: LASIX Take 1 tablet (20 mg total) by mouth daily. Start taking on:  March 15, 2023 What changed:  medication strength how much to take how to take this when to take this additional instructions   metoprolol tartrate 50 MG tablet Commonly known as: LOPRESSOR Take 1 tablet (50 mg total) by mouth 2 (two) times daily.   multivitamin with minerals Tabs tablet Take 1 tablet by mouth daily.   sertraline 25 MG tablet Commonly known as: ZOLOFT Take 1 tablet (25 mg total) by mouth daily. Start taking on: March 15, 2023   VITAMIN D PO Take 1 tablet by mouth daily.               Discharge Care Instructions  (From admission, onward)           Start     Ordered   03/14/23 0000  Discharge wound care:       Comments:  Pressure Injury 03/11/23 Toe (Comment  which one) Left Stage 2 -  Partial thickness loss of dermis presenting as a shallow open injury with a red, pink wound bed without slough. pressure ulcer on anterior lateral of great toe where next toe is compression     Wound care  Daily      Comments: Wound care to bilateral LEs (L>R):  Wash with soap and water, particularly between digits. Use gauze to gently cleanse between digits. Rinse and dry gently but thoroughly. Apply single layer of xeroform gauze [employer] [place] # [id number]) to areas of weeping. Top with ABD pad. Secure with Kerlix roll gauze applied from just below toes to just below knee/secure with paper tape. Top Kerlix with a 6-inch ACE bandage applied in a manner similar to Kerlix (toe to knee). Place foot (feet) into Prevalon boots.   03/14/23 1223            Contact information for after-discharge care     Destination     [facility] Preferred SNF .   Service: Skilled Nursing Contact information: [address] [place] [address] [phone]                    Discharge Exam: Filed Weights   03/11/23 0610 03/12/23 0524 03/13/23 0458  Weight: 68.6 kg 67.7 kg 67.1 kg   BP (!) 149/102 (BP Location: Right Arm)   Pulse 93   Temp 98.2 F (36.8 C) (Oral)   Resp 19   Ht  (1.575 m)   Wt 67.1 kg   SpO2 92%   BMI 27.07 kg/m   Patient with no chest pain or dyspnea, very weak and deconditioned.   Neurology somnolent but able to open her eyes to voice and answer simple questions ENT with mild pallor Cardiovascular with S1 and S2 present, irregularly irregular with no gallops, rubs or murmurs No JVD Trace lower extremity edema Respiratory with prolonged expiratory phase with scattered rhonchi with no rales or wheezing Abdomen with no distention   Condition at discharge: stable  The results of significant diagnostics from this hospitalization (including imaging, microbiology,  ancillary and laboratory) are listed below for reference.   Imaging Studies: CT HEAD WO CONTRAST ( )  Result Date: 03/12/2023 CLINICAL DATA:  Mental status change, cause. EXAM: CT HEAD WITHOUT CONTRAST TECHNIQUE: Contiguous axial images were obtained from the base of the skull through the vertex without intravenous contrast. RADIATION DOSE REDUCTION: This exam was performed according to the departmental dose-optimization program which includes automated exposure control, adjustment of the mA and/or kV according to patient size and/or use of iterative  reconstruction technique. COMPARISON:  CT head without contrast 12/14/2018 FINDINGS: Brain: Mild generalized atrophy and moderate white matter disease demonstrates some progression since the prior exam. No acute infarct, hemorrhage, or mass lesion is present. Deep brain nuclei are within normal limits. The ventricles are of normal size. No significant extraaxial fluid collection is present. The brainstem and cerebellum are within normal limits. Midline structures are within normal limits. Vascular: Minimal calcifications are present within the cavernous internal carotid arteries, similar the prior exam. No hyperdense vessel is present. Skull: Calvarium is intact. No focal lytic or blastic lesions are present. No significant extracranial soft tissue lesion is present. Sinuses/Orbits: The paranasal sinuses and mastoid air cells are clear. The globes and orbits are within normal limits. IMPRESSION: 1. No acute intracranial abnormality or significant interval change. 2. Mild generalized atrophy and moderate white matter disease demonstrates some progression since the prior exam. This likely reflects the sequela of chronic microvascular ischemia. Electronically Signed   By: Marin Roberts M.D.   On: 03/12/2023 18:36   US Abdomen Complete  Result Date: 03/09/2023 CLINICAL DATA:  Elevated liver function tests EXAM: ABDOMEN ULTRASOUND COMPLETE COMPARISON:  CT  03/07/2023 FINDINGS: Gallbladder: Previous cholecystectomy. Common bile duct: Diameter: 2 mm Liver: No focal lesion identified. Within normal limits in parenchymal echogenicity. Portal vein is patent on color Doppler imaging with normal direction of blood flow towards the liver. IVC: No abnormality visualized. Pancreas: Obscured by overlapping bowel gas. Spleen: Size and appearance within normal limits. Right Kidney: Length: 9.5. Mild parenchymal atrophy. 4.5 cm hypoechoic area identified towards the lower aspect of the right kidney consistent with known mass and possible neoplasm as seen on CT. Left Kidney: Length: 9.3. Echogenicity within normal limits. No mass or hydronephrosis visualized. Abdominal aorta: Obscured by overlapping bowel gas. Other findings: Mild ascites. IMPRESSION: Previous cholecystectomy.  No ductal dilatation. Mild ascites. Mass lesion along the right kidney corresponds to the finding by CT scan and possible neoplasm on that exam. Electronically Signed   By: Karen Kays M.D.   On: 03/09/2023 12:25   ECHOCARDIOGRAM COMPLETE  Result Date: 03/08/2023    ECHOCARDIOGRAM REPORT   Patient Name:   Kristina Martin Date of Exam: 03/08/2023 Medical Rec #:  [id number]     Height:       62.0 in Accession #:    [id number]    Weight:       136.7 lb Date of Birth:  June 11, 1946    BSA:          1.626 m Patient Age:    76 years      BP:           127/70 mmHg Patient Gender: F             HR:           75 bpm. Exam Location:  Inpatient Procedure: 2D Echo, Cardiac Doppler and Color Doppler Indications:    CHF  History:        Patient has prior history of Echocardiogram examinations. CHF,                 Arrythmias:Atrial Fibrillation; Risk Factors:Hypertension.  Sonographer:    Wallie Char Referring Phys: [id number] ERIC CHEN IMPRESSIONS  1. Left ventricular ejection fraction, by estimation, is 60 to 65%. The left ventricle has normal function. The left ventricle has no regional wall motion abnormalities. Left  ventricular diastolic function could not be evaluated. There is the interventricular septum is flattened in systole  and diastole, consistent with right ventricular pressure and volume overload.  2. Right ventricular systolic function is moderately reduced. The right ventricular size is severely enlarged. There is moderately elevated pulmonary artery systolic pressure. The estimated right ventricular systolic pressure is 52.5 mmHg.  3. Right atrial size was severely dilated.  4. The mitral valve is degenerative. Trivial mitral valve regurgitation. Severe mitral annular calcification.  5. The tricuspid valve is abnormal. Tricuspid valve regurgitation is severe.  6. The aortic valve is tricuspid. Aortic valve regurgitation is not visualized. Aortic valve sclerosis/calcification is present, without any evidence of aortic stenosis.  7. The inferior vena cava is dilated in size with <50% respiratory variability, suggesting right atrial pressure of 15 mmHg. Comparison(s): Prior images reviewed side by side. Mitral insufficiency is less severe on the current study. The right ventricle was moderately depressed on the previous study as well. FINDINGS  Left Ventricle: Left ventricular ejection fraction, by estimation, is 60 to 65%. The left ventricle has normal function. The left ventricle has no regional wall motion abnormalities. The left ventricular internal cavity size was normal in size. There is  no left ventricular hypertrophy. The interventricular septum is flattened in systole and diastole, consistent with right ventricular pressure and volume overload. Left ventricular diastolic function could not be evaluated due to atrial fibrillation. Left ventricular diastolic function could not be evaluated. Right Ventricle: The right ventricular size is severely enlarged. No increase in right ventricular wall thickness. Right ventricular systolic function is moderately reduced. There is moderately elevated pulmonary artery  systolic pressure. The tricuspid regurgitant velocity is 3.06 m/s, and with an assumed right atrial pressure of 15 mmHg, the estimated right ventricular systolic pressure is 52.5 mmHg. Left Atrium: Left atrial size was normal in size. Right Atrium: Right atrial size was severely dilated. Prominent Eustachian valve. Pericardium: Trivial pericardial effusion is present. Mitral Valve: The mitral valve is degenerative in appearance. Severe mitral annular calcification. Trivial mitral valve regurgitation. MV peak gradient, 4.8 mmHg. The mean mitral valve gradient is 1.0 mmHg. Tricuspid Valve: The tricuspid valve is abnormal. Tricuspid valve regurgitation is severe. Aortic Valve: The aortic valve is tricuspid. Aortic valve regurgitation is not visualized. Aortic valve sclerosis/calcification is present, without any evidence of aortic stenosis. Aortic valve mean gradient measures 4.2 mmHg. Aortic valve peak gradient measures 7.5 mmHg. Aortic valve area, by VTI measures 1.99 cm. Pulmonic Valve: The pulmonic valve was normal in structure. Pulmonic valve regurgitation is trivial. Aorta: The aortic root and ascending aorta are structurally normal, with no evidence of dilitation. Venous: The inferior vena cava is dilated in size with less than 50% respiratory variability, suggesting right atrial pressure of 15 mmHg. IAS/Shunts: No atrial level shunt detected by color flow Doppler.  LEFT VENTRICLE PLAX 2D LVIDd:         3.80 cm     Diastology LVIDs:         2.50 cm     LV e' medial:    6.27 cm/s LV PW:         0.90 cm     LV E/e' medial:  16.1 LV IVS:        0.70 cm     LV e' lateral:   10.10 cm/s LVOT diam:     1.70 cm     LV E/e' lateral: 10.0 LV SV:         50 LV SV Index:   31 LVOT Area:     2.27 cm  LV Volumes (MOD) LV  vol d, MOD A2C: 55.2 ml LV vol d, MOD A4C: 39.4 ml LV vol s, MOD A2C: 19.8 ml LV vol s, MOD A4C: 14.5 ml LV SV MOD A2C:     35.4 ml LV SV MOD A4C:     39.4 ml LV SV MOD BP:      30.3 ml RIGHT VENTRICLE             IVC RV Basal diam:  3.60 cm    IVC diam: 2.60 cm RV S prime:     8.82 cm/s TAPSE (M-mode): 1.3 cm LEFT ATRIUM             Index        RIGHT ATRIUM           Index LA diam:        3.10 cm 1.91 cm/m   RA Area:     25.90 cm LA Vol (A2C):   32.0 ml 19.68 ml/m  RA Volume:   89.70 ml  55.16 ml/m LA Vol (A4C):   32.6 ml 20.05 ml/m LA Biplane Vol: 32.6 ml 20.05 ml/m  AORTIC VALVE AV Area (Vmax):    1.84 cm AV Area (Vmean):   1.75 cm AV Area (VTI):     1.99 cm AV Vmax:           136.50 cm/s AV Vmean:          94.450 cm/s AV VTI:            0.254 m AV Peak Grad:      7.5 mmHg AV Mean Grad:      4.2 mmHg LVOT Vmax:         110.40 cm/s LVOT Vmean:        72.900 cm/s LVOT VTI:          0.222 m LVOT/AV VTI ratio: 0.87  AORTA Ao Root diam: 3.00 cm Ao Asc diam:  3.10 cm MITRAL VALVE                TRICUSPID VALVE MV Area (PHT): 4.83 cm     TR Peak grad:   37.5 mmHg MV Area VTI:   2.04 cm     TR Mean grad:   25.0 mmHg MV Peak grad:  4.8 mmHg     TR Vmax:        306.00 cm/s MV Mean grad:  1.0 mmHg     TR Vmean:       241.0 cm/s MV Vmax:       1.10 m/s MV Vmean:      41.2 cm/s    SHUNTS MV Decel Time: 157 msec     Systemic VTI:  0.22 m MV E velocity: 101.00 cm/s  Systemic Diam: 1.70 cm Rachelle Hora Croitoru MD Electronically signed by Thurmon Fair MD Signature Date/Time: 03/08/2023/11:52:24 AM    Final    CT CHEST ABDOMEN PELVIS W CONTRAST  Result Date: 03/07/2023 CLINICAL DATA:  Altered mental status and sepsis. Dry cough for 2 days. Bilateral leg swelling. Hypoxic. EXAM: CT CHEST, ABDOMEN, AND PELVIS WITH CONTRAST TECHNIQUE: Multidetector CT imaging of the chest, abdomen and pelvis was performed following the standard protocol during bolus administration of intravenous contrast. RADIATION DOSE REDUCTION: This exam was performed according to the departmental dose-optimization program which includes automated exposure control, adjustment of the mA and/or kV according to patient size and/or use of iterative  reconstruction technique. CONTRAST:  60mL OMNIPAQUE IOHEXOL 350 MG/ML SOLN COMPARISON:  Chest radiograph earlier  today FINDINGS: CT CHEST FINDINGS Cardiovascular: Cardiomegaly with dilation of the right atrium and reflux of contrast into the hepatic veins compatible with elevated right heart pressures. Mitral annular and aortic valve calcification. Coronary artery and aortic atherosclerotic calcification. 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 Additional 7 mm penetrating atherosclerotic ulcer in the posterior descending thoracic aorta. No aortic aneurysm. No central pulmonary embolism. Mediastinum/Nodes: Unremarkable esophagus.  No thoracic adenopathy. Lungs/Pleura: Advanced emphysema. No focal consolidation, pleural effusion, or pneumothorax. Musculoskeletal: No acute osseous abnormality. CT ABDOMEN PELVIS FINDINGS Hepatobiliary: Hepatic steatosis. Mild nodularity of the hepatic contour. Cholecystectomy. No biliary dilation. Pancreas: Unremarkable. Spleen: Unremarkable. Adrenals/Urinary Tract: Normal adrenal glands. Bilateral cortical renal scarring. Oval heterogenous enhancing lesion in the lower pole of the right kidney measuring 3.7 x 3.0 x 4.1 cm consistent with neoplasm. No obstructing urinary calculi or hydronephrosis. Unremarkable bladder. Stomach/Bowel: Normal caliber large and small bowel. No bowel wall thickening. The appendix is not definitively visualized. Stomach is within normal limits. Vascular/Lymphatic: Advanced aortic atherosclerotic calcification. 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 Plaque at the origin of the SMA causes moderate narrowing. Reproductive: No acute abnormality.  No adnexal mass. Other: Diffuse mesenteric edema. Small volume free fluid in the pelvis, left pericolic gutter, and about the spleen. Musculoskeletal: No acute osseous abnormality.  Body wall  anasarca. IMPRESSION: 1. Cardiomegaly with dilation of the right atrium and reflux of contrast into the hepatic veins compatible with elevated right heart pressures. 2. Oval heterogenous enhancing lesion in the lower pole of the right kidney measuring up to 4.1 cm consistent with neoplasm. Recommend Urology consultation. 3. Hepatic steatosis with mild nodularity of the hepatic contour suggesting cirrhosis. 4. Diffuse mesenteric edema and small volume free fluid in the abdomen and pelvis. Body wall anasarca. 5. Aortic Atherosclerosis (ICD10-I70.0) and Emphysema (ICD10-J43.9). 6. 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 No aortic aneurysm or dissection. 7. 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 Moderate narrowing at the origin of the SMA. Electronically Signed   By: Minerva Fester M.D.   On: 03/07/2023 21:23   CT Head Wo Contrast  Result Date: 03/07/2023 CLINICAL DATA:  Mental status change, unknown cause EXAM: CT HEAD WITHOUT CONTRAST TECHNIQUE: Contiguous axial images were obtained from the base of the skull through the vertex without intravenous contrast. RADIATION DOSE REDUCTION: This exam was performed according to the departmental dose-optimization program which includes automated exposure control, adjustment of the mA and/or kV according to patient size and/or use of iterative reconstruction technique. COMPARISON:  CT head 12/14/2018 FINDINGS: Brain: No evidence of large-territorial acute infarction. No parenchymal hemorrhage. No mass lesion. No extra-axial collection. No mass effect or midline shift. No hydrocephalus. Basilar cisterns are patent. Vascular: No hyperdense vessel. Skull: No acute fracture or focal lesion. Sinuses/Orbits: Paranasal sinuses and mastoid air cells are clear. The orbits are unremarkable. Other: None. IMPRESSION: No acute intracranial abnormality. Electronically Signed   By: Tish Frederickson M.D.   On:  03/07/2023 21:19   DG Chest Port 1 View  Result Date: 03/07/2023 CLINICAL DATA:  Altered mental status EXAM: PORTABLE CHEST 1 VIEW COMPARISON:  X-ray 12/27/2021 FINDINGS: Enlarged cardiopericardial silhouette. No consolidation, pneumothorax or effusion. No edema. Hyperinflation. Calcified aorta. Overlapping cardiac leads IMPRESSION: Hyperinflation.  Enlarged cardiopericardial silhouette. Electronically Signed   By: Karen Kays M.D.   On: 03/07/2023 17:51  Microbiology: Results for orders placed or performed during the hospital encounter of 03/07/23  SARS Coronavirus 2 by RT PCR (hospital order, performed in [facility] hospital lab) *cepheid single result test* Anterior Nasal Swab     Status: None   Collection Time: 03/07/23  4:10 PM   Specimen: Anterior Nasal Swab  Result Value Ref Range Status   SARS Coronavirus 2 by RT PCR NEGATIVE NEGATIVE Final    Comment: Performed at [facility], [address] [place], [place] [address]  Culture, blood (routine x 2)     Status: None   Collection Time: 03/07/23  4:11 PM   Specimen: BLOOD  Result Value Ref Range Status   Specimen Description BLOOD RIGHT ANTECUBITAL  Final   Special Requests   Final    BOTTLES DRAWN AEROBIC AND ANAEROBIC Blood Culture results may not be optimal due to an inadequate volume of blood received in culture bottles   Culture   Final    NO GROWTH 5 DAYS Performed at [facility], [address] [place], [place] [address]    Report Status 03/12/2023 FINAL  Final  Culture, blood (routine x 2)     Status: None (Preliminary result)   Collection Time: 03/07/23  4:16 PM   Specimen: BLOOD  Result Value Ref Range Status   Specimen Description BLOOD SITE NOT SPECIFIED  Final   Special Requests   Final    BOTTLES DRAWN AEROBIC AND ANAEROBIC Blood Culture results may not be optimal due to an inadequate volume of blood received in culture bottles   Culture  Setup Time   Final    AEROBIC BOTTLE ONLY GRAM POSITIVE  RODS CRITICAL RESULT CALLED TO, READ BACK BY AND VERIFIED WITH:  C/ PHARMD C. AMEND 03/12/23 1637 A. LAFRANCE    Culture   Final    GRAM POSITIVE RODS CORRECTED ON 04/21 AT 1636: PREVIOUSLY REPORTED AS NO GROWTH 5 DAYS CULTURE REINCUBATED FOR BETTER GROWTH Performed at [facility], [address], [place], [place] [address]    Report Status PENDING  Incomplete  MRSA Next Gen by PCR, Nasal     Status: None   Collection Time: 03/09/23  1:57 AM   Specimen: Nasal Mucosa; Nasal Swab  Result Value Ref Range Status   MRSA by PCR Next Gen NOT DETECTED NOT DETECTED Final    Comment: (NOTE) The GeneXpert MRSA Assay (FDA approved for NASAL specimens only), is one component of a comprehensive MRSA colonization surveillance program. It is not intended to diagnose MRSA infection nor to guide or monitor treatment for MRSA infections. Test performance is not FDA approved in patients less than 18 years old. Performed at [facility], [address] [place], [place] [address]     Labs: CBC: Recent Labs  Lab 03/07/23 1625 03/08/23 0453 03/09/23 0213 03/10/23 0246  WBC 8.3 11.2* 10.1 9.1  NEUTROABS 4.5 7.4  --   --   HGB 15.1* 14.9 13.9 13.1  HCT 46.6* 44.6 43.4 39.7  MCV 89.4 87.6 89.1 88.6  PLT 201 220 196 159   Basic Metabolic Panel: Recent Labs  Lab 03/07/23 1905 03/08/23 1220 03/09/23 0213 03/10/23 0246 03/11/23 0550 03/12/23 0217 03/13/23 0244 03/14/23 0328  NA 136 135 137 135 134* 135 137 137  K 3.3* 4.1 4.0 3.4* 4.6 4.6 4.7 5.3*  CL 94* 97* 98 96* 99 100 98 97*  CO2 28 25 26 28 28 29 30 29   GLUCOSE 100*  126* 121* 112* 91 97 96 92  BUN 24* 27* 30* 29* 23 21 23  29*  CREATININE 1.24* 1.40* 1.42* 1.09* 0.92 0.82 1.04* 1.04*  CALCIUM 9.2 8.7* 8.8* 8.4* 8.6* 8.5* 8.9 9.2  MG 1.9 2.3 2.1  --   --   --   --   --    Liver Function Tests: Recent Labs  Lab 03/07/23 1905 03/08/23 1220 03/09/23 0213  AST 34 29 26  ALT 21 20 17   ALKPHOS 128* 126 116  BILITOT  4.3* 4.4* 4.2*  PROT 6.1* 6.1* 5.8*  ALBUMIN 3.2* 3.2* 2.9*   CBG: Recent Labs  Lab 03/10/23 0748  GLUCAP 86    Discharge time spent: greater than 30 minutes.  Signed: Coralie Keens, MD [facility] 03/14/2023

## 2023-03-14 NOTE — Progress Notes (Signed)
Physical Therapy Treatment Patient Details Name: Kristina Martin MRN: [id number] DOB: [birth year] Today's Date: 03/14/2023   History of Present Illness Patient is a 77 year old female with complaints of progressive weakness, AMS, weeping leg edema. Found to have A-fib with RVR, LE cellulitis, and acute on chronic heart failure. PMH - chf, htn, afib, depression.    PT Comments    Pt more alert and following more commands. Improved bed mobility and sitting balance. Not initiating standing with [place] at this time. Patient will benefit from continued post acute follow up therapy, <3 hours/day    Recommendations for follow up therapy are one component of a multi-disciplinary discharge planning process, led by the attending physician.  Recommendations may be updated based on patient status, additional functional criteria and insurance authorization.  Follow Up Recommendations  Can patient physically be transported by private vehicle: No    Assistance Recommended at Discharge Frequent or constant Supervision/Assistance  Patient can return home with the following Two people to help with walking and/or transfers;A lot of help with bathing/dressing/bathroom;Assistance with cooking/housework;Help with stairs or ramp for entrance;Assist for transportation   Equipment Recommendations  Other (comment) (To be determined at next venue)    Recommendations for Other Services       Precautions / Restrictions Precautions Precautions: Fall Restrictions Weight Bearing Restrictions: No     Mobility  Bed Mobility Overal bed mobility: Needs Assistance Bed Mobility: Supine to Sit, Sit to Sidelying     Supine to sit: Mod assist, +2 for safety/equipment, HOB elevated   Sit to sidelying: Mod assist, +2 for safety/equipment General bed mobility comments: Assist to initiate bring legs off of bed, elevating trunk and bringing hips to EOB. Assist to initiate lowering trunk and to complete bringing legs all the  way up into bed.    Transfers                   General transfer comment: Attempted to stand x 5 (with rolling walker and with Stedy) but pt unable to initiate stand despite verbal and tactile cues.    Ambulation/Gait                   Stairs             Wheelchair Mobility    Modified Rankin (Stroke Patients Only)       Balance Overall balance assessment: Needs assistance Sitting-balance support: Feet unsupported, Bilateral upper extremity supported Sitting balance-Leahy Scale: Poor Sitting balance - Comments: Sat EOB x 10 minutes with UE support and min guard to min assist                                    Cognition Arousal/Alertness: Awake/alert Behavior During Therapy: Flat affect Overall Cognitive Status: Impaired/Different from baseline Area of Impairment: Orientation, Problem solving, Following commands, Attention                 Orientation Level: Disoriented to, Time, Situation Current Attention Level: Sustained   Following Commands: Follows one step commands inconsistently, Follows one step commands with increased time     Problem Solving: Slow processing, Decreased initiation, Difficulty sequencing, Requires verbal cues, Requires tactile cues General Comments: Followed ~50% of 1 step commands.        Exercises      General Comments General comments (skin integrity, edema, etc.): VSS on RA      Pertinent  Vitals/Pain Pain Assessment Pain Assessment: Faces Faces Pain Scale: No hurt    Home Living                          Prior Function            PT Goals (current goals can now be found in the care plan section) Acute Rehab PT Goals Patient Stated Goal: Pt unable. Family not present    Frequency    Min 1X/week      PT Plan Current plan remains appropriate    Co-evaluation              AM-PAC PT "6 Clicks" Mobility   Outcome Measure  Help needed turning from your back  to your side while in a flat bed without using bedrails?: A Lot Help needed moving from lying on your back to sitting on the side of a flat bed without using bedrails?: A Lot Help needed moving to and from a bed to a chair (including a wheelchair)?: Total Help needed standing up from a chair using your arms (e.g., wheelchair or bedside chair)?: Total Help needed to walk in hospital room?: Total Help needed climbing 3-5 steps with a railing? : Total 6 Click Score: 8    End of Session   Activity Tolerance: Patient limited by fatigue Patient left: in bed;with call bell/phone within reach;with bed alarm set;with family/visitor present Nurse Communication: Mobility status PT Visit Diagnosis: Muscle weakness (generalized) (M62.81);Unsteadiness on feet (R26.81)     Time: 1440-1459 PT Time Calculation (min) (ACUTE ONLY): 19 min  Charges:  $Therapeutic Activity: 8-22 mins                     [facility] PT Acute Rehabilitation Services Office [phone]    Angelina Ok [facility] 03/14/2023, 3:25 PM

## 2023-03-14 NOTE — TOC Progression Note (Addendum)
Transition of Care [facility]) - Progression Note    Patient Details  Name: Kristina Martin MRN: [id number] Date of Birth: [birth year]  [facility]) CM/SW Contact  Delilah Shan, LCSWA Phone Number: 03/14/2023, 11:34 AM  Clinical Narrative:     Update-1:35pm- CSW received call from [facility] with HTA requesting for PT to see patient again. CSW informed MD. CSW awaiting current PT note for insurance determination.  CSW spoke with Junious Dresser with HTA who informed CSW that patients insurance authorization is still pending for SNF and PTAR. CSW informed MD.Patient has SNF bed at [facility]. CSW will continue to follow and assist with patients dc planning needs.  Update-CSW LVM with Junious Dresser with HTA. CSW awaiting callback to inform that PT note requested is in. CSW will continue to follow.  CSW received call back from [place] with HTA . CSW informed Junious Dresser PT note requested is in . CSW will continue to follow.   Barriers to Discharge: Continued Medical Work up  Expected Discharge Plan and Services In-house Referral: Clinical Social Work     Living arrangements for the past 2 months: Single Family Home                                       Social Determinants of Health (SDOH) Interventions SDOH Screenings   Food Insecurity: No Food Insecurity (12/28/2021)  Housing: Low Risk  (12/28/2021)  Transportation Needs: No Transportation Needs (12/28/2021)  Alcohol Screen: Low Risk  (12/28/2021)  Financial Resource Strain: Low Risk  (12/28/2021)  Tobacco Use: Medium Risk (03/08/2023)    Readmission Risk Interventions     No data to display

## 2023-03-14 NOTE — Progress Notes (Signed)
[facility]) Hospital Liaison Note  Notified by TOC manager of patient/family request for [facility] palliative services at home after discharge.   [facility] hospital liaison will follow patient for discharge disposition.   Please call with any hospice or outpatient palliative care related questions.   Thank you for the opportunity to participate in this patient's care.   Shanita Wicker, LCSW [facility] Hospital Liaison [phone]  

## 2023-03-14 NOTE — Progress Notes (Addendum)
Occupational Therapy Treatment Patient Details Name: Kristina Martin MRN: [id number] DOB: 02-Dec-1945 Today's Date: 03/14/2023   History of present illness Patient is a 77 year old female with complaints of progressive weakness, AMS, weeping leg edema. Found to have A-fib with RVR, LE cellulitis, and acute on chronic heart failure. PMH - chf, htn, afib, depression.   OT comments  Pt's RN reports that pt more alert today. Pt was able to roll L and R in bed mod A, sat EOB with max A and required max - mod A for balance. Pt sat EOB less than 1 minute. Pt washed face and hands with sitting in bed with HOB raised. Pt's O2 SATs remained 92-97% and HR 102-110s with minimal exertion. Pt required increased time to follow commands. OT will continue to follow acutely to maximize level of function and safety   Recommendations for follow up therapy are one component of a multi-disciplinary discharge planning process, led by the attending physician.  Recommendations may be updated based on patient status, additional functional criteria and insurance authorization.    Assistance Recommended at Discharge Frequent or constant Supervision/Assistance  Patient can return home with the following  Help with stairs or ramp for entrance;Assist for transportation;Assistance with cooking/housework;A lot of help with walking and/or transfers;A lot of help with bathing/dressing/bathroom   Equipment Recommendations  Other (comment) (TBD at next venue of care)    Recommendations for Other Services      Precautions / Restrictions Precautions Precautions: Fall Restrictions Weight Bearing Restrictions: No       Mobility Bed Mobility Overal bed mobility: Needs Assistance Bed Mobility: Supine to Sit, Sit to Supine     Supine to sit: Max assist Sit to supine: Max assist   General bed mobility comments: Assist to bring legs off of bed, elevate trunk into sitting, and bring hips to EOB. Assist to lower trunk and bring  legs back up into bed.    Transfers                   General transfer comment: unable to attempt     Balance Overall balance assessment: Needs assistance Sitting-balance support: Feet unsupported, Bilateral upper extremity supported Sitting balance-Leahy Scale: Poor Sitting balance - Comments: max A sitting EOB less than 1 minute                                   ADL either performed or assessed with clinical judgement   ADL Overall ADL's : Needs assistance/impaired     Grooming: Wash/dry hands;Wash/dry face;Maximal assistance;Sitting;Bed level                                 General ADL Comments: Pt required max to sit EOB and unable to maintain sitting balance without max/mod A for support, leaning to L side. Pt sat EOB less than 1 minute    Extremity/Trunk Assessment Upper Extremity Assessment Upper Extremity Assessment: Generalized weakness   Lower Extremity Assessment Lower Extremity Assessment: Defer to PT evaluation        Vision Patient Visual Report: No change from baseline     Perception     Praxis      Cognition Arousal/Alertness: Awake/alert Behavior During Therapy: Flat affect Overall Cognitive Status: Impaired/Different from baseline Area of Impairment: Orientation, Problem solving, Following commands  Orientation Level: Disoriented to, Time, Situation     Following Commands: Follows one step commands inconsistently, Follows one step commands with increased time Safety/Judgement: Decreased awareness of safety, Decreased awareness of deficits   Problem Solving: Slow processing, Decreased initiation, Difficulty sequencing, Requires verbal cues, Requires tactile cues          Exercises      Shoulder Instructions       General Comments      Pertinent Vitals/ Pain       Pain Assessment Pain Assessment: No/denies pain Faces Pain Scale: No hurt Pain Intervention(s): Monitored  during session, Repositioned  Home Living                                          Prior Functioning/Environment              Frequency  Min 2X/week        Progress Toward Goals  OT Goals(current goals can now be found in the care plan section)  Progress towards OT goals: OT to reassess next treatment     Plan Discharge plan remains appropriate    Co-evaluation                 AM-PAC OT "6 Clicks" Daily Activity     Outcome Measure   Help from another person eating meals?: A Little Help from another person taking care of personal grooming?: A Lot Help from another person toileting, which includes using toliet, bedpan, or urinal?: A Lot Help from another person bathing (including washing, rinsing, drying)?: A Lot Help from another person to put on and taking off regular upper body clothing?: A Lot Help from another person to put on and taking off regular lower body clothing?: A Lot 6 Click Score: 13    End of Session    OT Visit Diagnosis: Unsteadiness on feet (R26.81);Muscle weakness (generalized) (M62.81);Other symptoms and signs involving cognitive function   Activity Tolerance Patient limited by fatigue   Patient Left in bed;with call bell/phone within reach;with bed alarm set   Nurse Communication          Time: [phone] OT Time Calculation (min): 19 min  Charges: OT Evaluation $OT Eval Low Complexity: 1 Low OT Treatments $Therapeutic Activity: 8-22 mins    Galen Manila 03/14/2023, 1:39 PM

## 2023-03-15 ENCOUNTER — Inpatient Hospital Stay (HOSPITAL_COMMUNITY): Payer: PPO

## 2023-03-15 DIAGNOSIS — I5033 Acute on chronic diastolic (congestive) heart failure: Secondary | ICD-10-CM | POA: Diagnosis not present

## 2023-03-15 LAB — BLOOD GAS, ARTERIAL
Acid-Base Excess: 12.5 mmol/L — ABNORMAL HIGH (ref 0.0–2.0)
Bicarbonate: 38.9 mmol/L — ABNORMAL HIGH (ref 20.0–28.0)
Drawn by: 336832
O2 Saturation: 92.4 %
Patient temperature: 37
pCO2 arterial: 56 mmHg — ABNORMAL HIGH (ref 32–48)
pH, Arterial: 7.45 (ref 7.35–7.45)
pO2, Arterial: 62 mmHg — ABNORMAL LOW (ref 83–108)

## 2023-03-15 LAB — CBC
HCT: 47.6 % — ABNORMAL HIGH (ref 36.0–46.0)
Hemoglobin: 15.1 g/dL — ABNORMAL HIGH (ref 12.0–15.0)
MCH: 28.7 pg (ref 26.0–34.0)
MCHC: 31.7 g/dL (ref 30.0–36.0)
MCV: 90.3 fL (ref 80.0–100.0)
Platelets: 205 10*3/uL (ref 150–400)
RBC: 5.27 MIL/uL — ABNORMAL HIGH (ref 3.87–5.11)
RDW: 17.1 % — ABNORMAL HIGH (ref 11.5–15.5)
WBC: 8.1 10*3/uL (ref 4.0–10.5)
nRBC: 0 % (ref 0.0–0.2)

## 2023-03-15 LAB — COMPREHENSIVE METABOLIC PANEL
ALT: 26 U/L (ref 0–44)
AST: 38 U/L (ref 15–41)
Albumin: 3 g/dL — ABNORMAL LOW (ref 3.5–5.0)
Alkaline Phosphatase: 133 U/L — ABNORMAL HIGH (ref 38–126)
Anion gap: 14 (ref 5–15)
BUN: 30 mg/dL — ABNORMAL HIGH (ref 8–23)
CO2: 30 mmol/L (ref 22–32)
Calcium: 9.1 mg/dL (ref 8.9–10.3)
Chloride: 96 mmol/L — ABNORMAL LOW (ref 98–111)
Creatinine, Ser: 1.03 mg/dL — ABNORMAL HIGH (ref 0.44–1.00)
GFR, Estimated: 56 mL/min — ABNORMAL LOW (ref >60)
Glucose, Bld: 121 mg/dL — ABNORMAL HIGH (ref 70–99)
Potassium: 4.4 mmol/L (ref 3.5–5.1)
Sodium: 140 mmol/L (ref 135–145)
Total Bilirubin: 2.5 mg/dL — ABNORMAL HIGH (ref 0.3–1.2)
Total Protein: 6.2 g/dL — ABNORMAL LOW (ref 6.5–8.1)

## 2023-03-15 LAB — TSH: TSH: 3.61 u[IU]/mL (ref 0.350–4.500)

## 2023-03-15 LAB — AMMONIA: Ammonia: 50 umol/L — ABNORMAL HIGH (ref 9–35)

## 2023-03-15 MED ORDER — FUROSEMIDE 10 MG/ML IJ SOLN
40.0000 mg | Freq: Once | INTRAMUSCULAR | Status: AC
  Administered 2023-03-15: 40 mg via INTRAVENOUS
  Filled 2023-03-15: qty 4

## 2023-03-15 NOTE — TOC Transition Note (Signed)
Transition of Care [facility]) - CM/SW Discharge Note   Patient Details  Name: Kristina Martin MRN: [id number] Date of Birth: 06/13/1946  [facility]) CM/SW Contact:  Lawerance Sabal, RN Phone Number: 03/15/2023, 3:55 PM   Clinical Narrative:     Sherron Monday w family at bedside, son DIL and sister. Reviewed lovel of support provided by [facility] (patient active w [facility] PTA) and home hospice.  Provided with pamphlets from several local PCS providers and informed they could search online for additional ones.  Discussed with them next steps may include transitioning her into their house, and/ or family/ PCS to cover supervision.  Spoke w physician who after speaking w DIL has decided to hold DC for now and look in to causes for recent change in mental status. Per family request placed chaplain consult for review of HCPOA papers.     Barriers to Discharge: Continued Medical Work up   Patient Goals and CMS Choice CMS Medicare.gov Compare Post Acute Care list provided to:: Patient Represenative (must comment) (Patients son Kristina Martin) Choice offered to / list presented to : Adult Children (Patients son)  Discharge Placement                         Discharge Plan and Services Additional resources added to the After Visit Summary for   In-house Referral: Clinical Social Work                                   Social Determinants of Health (SDOH) Interventions SDOH Screenings   Food Insecurity: No Food Insecurity (12/28/2021)  Housing: Low Risk  (12/28/2021)  Transportation Needs: No Transportation Needs (12/28/2021)  Alcohol Screen: Low Risk  (12/28/2021)  Financial Resource Strain: Low Risk  (12/28/2021)  Tobacco Use: Medium Risk (03/08/2023)     Readmission Risk Interventions     No data to display

## 2023-03-15 NOTE — Progress Notes (Deleted)
Patient seen and examined, no changes from discharge summary as noted by Dr. Tally Due, discharge pending insurance authorization for SNF with palliative care. -She remains considerably debilitated, poor oral intake, limited participation with therapy, multiple medical problems, seen by palliative care this admission, plan for discharge to SNF for trial of rehab, anticipate need for transition to hospice when she declines further.  Zannie Cove, MD

## 2023-03-15 NOTE — Progress Notes (Addendum)
PROGRESS NOTE    Kristina Martin  RUE:[id number] DOB: [birth year] DOA: 03/07/2023 PCP: Kaleen Mask, MD  76/F with chronic diastolic CHF, pulmonary hypertension, A-fib, hypertension presented to the ED with worsening edema, stopped taking her meds 2 months prior after the death of her daughter, brought to the ED by her son for lethargy, increased swelling, poor appetite and failure to thrive.  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 Moderate narrowing at the origin of the SMA.  Subjective: No events overnight, somewhat drowsy, oral intake is poor, was able to take her meds  Assessment and Plan:  Acute on chronic diastolic CHF  RV failure, pulmonary hypertension Echo with EF 60 to 65%, RV with severe enlargement, moderate reduction in RV systolic function, pulmonary hypertension, severe TR.  -Diuresed with IV Lasix this admission, will repeat IV Lasix -Transition to oral Lasix tomorrow, continue Farxiga and metoprolol  -Aldactone held for hyperkalemia -Discharge plan and to SNF for rehab, however mental status is suboptimal  Encephalopathy -Subacute family reports decline in mental status and lethargy ongoing for about 2 weeks -No culprit meds identified, ABG without hypercarbia -Check labs, LFTs, ammonia level, CT with some concern for cirrhosis -UA last week not suggestive of UTI -Also check MRI brain  COPD -stable  Atrial fibrillation with rapid ventricular response -Required IV diltiazem initially, now stable on  oral metoprolol and apixaban  AKI (acute kidney injury) Hypo-Hyperkalemia and hyponatremia.  -Improved, stable  Hypertension -Continue blood pressure control with metoprolol.  Continue diuresis.   Left leg cellulitis -Treated with course of ceftriaxone followed by Keflex, now completed  Right renal mass -Highly suspicious for malignancy -Family was notified regarding this  Depression, major, single episode, severe -Continue with sertraline. Patient very debilitated, weak and deconditioned. 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   DVT prophylaxis: Apixaban Code Status: Full code Family Communication: None present, updated by daughter-in-law Disposition Plan: Likely home pending further workup  Consultants:    Procedures:   Antimicrobials:    Objective: Vitals:   03/14/23 1955 03/15/23 0400 03/15/23 0753 03/15/23 0851  BP: 120/78 96/68 114/72 (!) 140/95  Pulse: (!) 103 91 73 97  Resp: Temp: (!) 97.4 F (36.3 C) 97.6 F (36.4 C) 98 F (36.7 C)   TempSrc: Oral Axillary Axillary   SpO2: 91% 91% 100%   Weight:  69.5 kg    Height:        Intake/Output Summary (Last 24 hours) at 03/15/2023 1535 Last data filed at 03/14/2023 1800 Gross per 24 hour  Intake --  Output 200 ml  Net -200 ml   Filed Weights   03/12/23 0524 03/13/23 0458 03/15/23 0400  Weight: 67.7 kg 67.1 kg 69.5 kg    Examination:  General exam: Somnolent but arousable, minimally verbal, unable to assess orientation HEENT: No JVD CVS: S1-S2, regular rhythm Lungs: Decreased breath sounds to bases Abdomen: Soft, nontender, bowel sounds present Strategies: No edema Neuro: Somnolent but arousable, moves all extremities, no localizing signs Psych: Flat  affect   Data Reviewed:   CBC: Recent Labs  Lab 03/09/23 0213 03/10/23 0246  WBC 10.1 9.1  HGB 13.9 13.1  HCT 43.4 39.7  MCV 89.1 88.6  PLT 196 159   Basic  Metabolic Panel: Recent Labs  Lab 03/09/23 0213 03/10/23 0246 03/11/23 0550 03/12/23 0217 03/13/23 0244 03/14/23 0328 03/14/23 1207  NA 137   < > 134* 135 137 137 138  K 4.0   < > 4.6 4.6 4.7 5.3* 5.0  CL 98   < > 99 100 98 97* 97*  CO2 26   < > GLUCOSE 121*   < > 91 97 96 92 88  BUN 30*   < > 29* 30*  CREATININE 1.42*   < > 0.92 0.82 1.04* 1.04* 1.03*  CALCIUM 8.8*   < > 8.6* 8.5* 8.9 9.2 9.5  MG 2.1  --   --   --   --   --   --    < > = values in this interval not displayed.   GFR: Estimated Creatinine Clearance: 41.8 mL/min (A) (by C-G formula based on SCr of 1.03 mg/dL (H)). Liver Function Tests: Recent Labs  Lab 03/09/23 0213  AST 26  ALT 17  ALKPHOS 116  BILITOT 4.2*  PROT 5.8*  ALBUMIN 2.9*   No results for input(s): "LIPASE", "AMYLASE" in the last 168 hours. No results for input(s): "AMMONIA" in the last 168 hours. Coagulation Profile: No results for input(s): "INR", "PROTIME" in the last 168 hours. Cardiac Enzymes: No results for input(s): "CKTOTAL", "CKMB", "CKMBINDEX", "TROPONINI" in the last 168 hours. BNP (last 3 results) No results for input(s): "PROBNP" in the last 8760 hours. HbA1C: No results for input(s): "HGBA1C" in the last 72 hours. CBG: Recent Labs  Lab 03/10/23 0748  GLUCAP 86   Lipid Profile: No results for input(s): "CHOL", "HDL", "LDLCALC", "TRIG", "CHOLHDL", "LDLDIRECT" in the last 72 hours. Thyroid Function Tests: No results for input(s): "TSH", "T4TOTAL", "FREET4", "T3FREE", "THYROIDAB" in the last 72 hours. Anemia Panel: No results for input(s): "VITAMINB12", "FOLATE", "FERRITIN", "TIBC", "IRON", "RETICCTPCT" in the last 72 hours. Urine analysis:    Component Value Date/Time   COLORURINE AMBER (A) 03/08/2023 0002   APPEARANCEUR HAZY (A) 03/08/2023 0002   LABSPEC 1.017 03/08/2023 0002   PHURINE 5.0 03/08/2023 0002   GLUCOSEU >=500 (A) 03/08/2023 0002   HGBUR LARGE (A) 03/08/2023 0002   BILIRUBINUR  NEGATIVE 03/08/2023 0002   KETONESUR NEGATIVE 03/08/2023 0002   PROTEINUR NEGATIVE 03/08/2023 0002   UROBILINOGEN 0.2 05/15/2013 1155   NITRITE NEGATIVE 03/08/2023 0002   LEUKOCYTESUR NEGATIVE 03/08/2023 0002   Sepsis Labs: (procalcitonin:4,lacticidven:4)  ) Recent Results (from the past 240 hour(s))  SARS Coronavirus 2 by RT PCR (hospital order, performed in [facility] hospital lab) *cepheid single result test* Anterior Nasal Swab     Status: None   Collection Time: 03/07/23  4:10 PM   Specimen: Anterior Nasal Swab  Result Value Ref Range Status   SARS Coronavirus 2 by RT PCR NEGATIVE NEGATIVE Final    Comment: Performed at [facility], [address] [place], [place] [address]  Culture, blood (routine x 2)     Status: None   Collection Time: 03/07/23  4:11 PM   Specimen: BLOOD  Result Value Ref Range Status   Specimen Description BLOOD RIGHT ANTECUBITAL  Final   Special Requests   Final    BOTTLES DRAWN AEROBIC  AND ANAEROBIC Blood Culture results may not be optimal due to an inadequate volume of blood received in culture bottles   Culture   Final    NO GROWTH 5 DAYS Performed at [facility], [address] [place], [place] [address]    Report Status 03/12/2023 FINAL  Final  Culture, blood (routine x 2)     Status: None (Preliminary result)   Collection Time: 03/07/23  4:16 PM   Specimen: BLOOD  Result Value Ref Range Status   Specimen Description BLOOD SITE NOT SPECIFIED  Final   Special Requests   Final    BOTTLES DRAWN AEROBIC AND ANAEROBIC Blood Culture results may not be optimal due to an inadequate volume of blood received in culture bottles   Culture  Setup Time   Final    AEROBIC BOTTLE ONLY GRAM POSITIVE RODS CRITICAL RESULT CALLED TO, READ BACK BY AND VERIFIED WITH:  C/ PHARMD C. AMEND 03/12/23 1637 A. LAFRANCE    Culture   Final    GRAM POSITIVE RODS CORRECTED ON 04/21 AT 1636: PREVIOUSLY REPORTED AS NO GROWTH 5 DAYS CULTURE  REINCUBATED FOR BETTER GROWTH Performed at [facility], [address], [place], [place] [address]    Report Status PENDING  Incomplete  MRSA Next Gen by PCR, Nasal     Status: None   Collection Time: 03/09/23  1:57 AM   Specimen: Nasal Mucosa; Nasal Swab  Result Value Ref Range Status   MRSA by PCR Next Gen NOT DETECTED NOT DETECTED Final    Comment: (NOTE) The GeneXpert MRSA Assay (FDA approved for NASAL specimens only), is one component of a comprehensive MRSA colonization surveillance program. It is not intended to diagnose MRSA infection nor to guide or monitor treatment for MRSA infections. Test performance is not FDA approved in patients less than 11 years old. Performed at [facility], [address] [place], [place] [address]      Radiology Studies: No results found.   Scheduled Meds:  apixaban  5 mg Oral BID   dapagliflozin propanediol  10 mg Oral Daily   feeding supplement  237 mL Oral BID BM   metoprolol succinate  100 mg Oral Daily   multivitamin with minerals  1 tablet Oral Daily   sertraline  25 mg Oral Daily   Continuous Infusions:   LOS: 7 days    Time spent:    Zannie Cove, MD [facility]   03/15/2023, 3:35 PM

## 2023-03-15 NOTE — TOC Progression Note (Addendum)
Transition of Care [facility]) - Progression Note    Patient Details  Name: Kristina Martin MRN: [id number] Date of Birth: Oct 04, 1946  Transition of Care [facility]) CM/SW Contact  Delilah Shan, LCSWA Phone Number: 03/15/2023, 9:54 AM  Clinical Narrative:     Malachy Mood- MD informed CSW that peer to peer was completed and that insurance authorization for SNF was denied.CSW informed patients son Demetrius Charity would like to discuss home options for patient with CM. CSW informed MD and CM.  Update-1:50pm- CSW received call from [place] with [employer] patients insurance authorization for SNF went to peer to peer with Dr. Jacob Moores. Deadline is today 4/24 at 5:00pm. Telephone # [phone]. CSW informed MD. Sharin Mons was approved [id number].  Update 11:07am- CSW received call back from [place] with [employer] who informed CSW that patients insurance authorization for SNF and PTAR has gone to Wellsite geologist for review. CSW informed MD. CSW will continue to follow.  CSW LVM for [employer]. CSW awaiting call back to check on insurance authorization determination for SNF and PTAR. CSW will continue to follow and assist with patients dc planning needs.    Barriers to Discharge: Continued Medical Work up  Expected Discharge Plan and Services In-house Referral: Clinical Social Work     Living arrangements for the past 2 months: Single Family Home Expected Discharge Date: 03/14/23                                     Social Determinants of Health (SDOH) Interventions SDOH Screenings   Food Insecurity: No Food Insecurity (12/28/2021)  Housing: Low Risk  (12/28/2021)  Transportation Needs: No Transportation Needs (12/28/2021)  Alcohol Screen: Low Risk  (12/28/2021)  Financial Resource Strain: Low Risk  (12/28/2021)  Tobacco Use: Medium Risk (03/08/2023)    Readmission Risk Interventions     No data to display

## 2023-03-15 NOTE — Care Management Important Message (Signed)
Important Message  Patient Details  Name: Kristina Martin MRN: [id number] Date of Birth: [birth year]   Medicare Important Message Given:  Yes     [name] 03/15/2023, 9:41 AM

## 2023-03-16 DIAGNOSIS — R600 Localized edema: Secondary | ICD-10-CM | POA: Diagnosis not present

## 2023-03-16 DIAGNOSIS — N179 Acute kidney failure, unspecified: Secondary | ICD-10-CM | POA: Diagnosis not present

## 2023-03-16 DIAGNOSIS — Z66 Do not resuscitate: Secondary | ICD-10-CM

## 2023-03-16 DIAGNOSIS — K7460 Unspecified cirrhosis of liver: Secondary | ICD-10-CM

## 2023-03-16 DIAGNOSIS — B9689 Other specified bacterial agents as the cause of diseases classified elsewhere: Secondary | ICD-10-CM | POA: Diagnosis not present

## 2023-03-16 DIAGNOSIS — I5033 Acute on chronic diastolic (congestive) heart failure: Secondary | ICD-10-CM | POA: Diagnosis not present

## 2023-03-16 DIAGNOSIS — I509 Heart failure, unspecified: Secondary | ICD-10-CM | POA: Diagnosis not present

## 2023-03-16 DIAGNOSIS — G9340 Encephalopathy, unspecified: Secondary | ICD-10-CM | POA: Diagnosis not present

## 2023-03-16 DIAGNOSIS — L03116 Cellulitis of left lower limb: Secondary | ICD-10-CM | POA: Diagnosis not present

## 2023-03-16 LAB — CBC
HCT: 44 % (ref 36.0–46.0)
Hemoglobin: 14.4 g/dL (ref 12.0–15.0)
MCH: 29.3 pg (ref 26.0–34.0)
MCHC: 32.7 g/dL (ref 30.0–36.0)
MCV: 89.6 fL (ref 80.0–100.0)
Platelets: 171 10*3/uL (ref 150–400)
RBC: 4.91 MIL/uL (ref 3.87–5.11)
RDW: 16.9 % — ABNORMAL HIGH (ref 11.5–15.5)
WBC: 7.3 10*3/uL (ref 4.0–10.5)
nRBC: 0 % (ref 0.0–0.2)

## 2023-03-16 LAB — COMPREHENSIVE METABOLIC PANEL
ALT: 23 U/L (ref 0–44)
AST: 35 U/L (ref 15–41)
Albumin: 2.6 g/dL — ABNORMAL LOW (ref 3.5–5.0)
Alkaline Phosphatase: 116 U/L (ref 38–126)
Anion gap: 10 (ref 5–15)
BUN: 29 mg/dL — ABNORMAL HIGH (ref 8–23)
CO2: 34 mmol/L — ABNORMAL HIGH (ref 22–32)
Calcium: 8.8 mg/dL — ABNORMAL LOW (ref 8.9–10.3)
Chloride: 97 mmol/L — ABNORMAL LOW (ref 98–111)
Creatinine, Ser: 1.09 mg/dL — ABNORMAL HIGH (ref 0.44–1.00)
GFR, Estimated: 52 mL/min — ABNORMAL LOW (ref >60)
Glucose, Bld: 101 mg/dL — ABNORMAL HIGH (ref 70–99)
Potassium: 4.1 mmol/L (ref 3.5–5.1)
Sodium: 141 mmol/L (ref 135–145)
Total Bilirubin: 2.2 mg/dL — ABNORMAL HIGH (ref 0.3–1.2)
Total Protein: 5.5 g/dL — ABNORMAL LOW (ref 6.5–8.1)

## 2023-03-16 MED ORDER — HALOPERIDOL 1 MG PO TABS: 2.0000 mg | ORAL_TABLET | Freq: Four times a day (QID) | ORAL | Status: DC | PRN

## 2023-03-16 MED ORDER — GLYCOPYRROLATE 0.2 MG/ML IJ SOLN: 0.2000 mg | INTRAMUSCULAR | Status: DC | PRN

## 2023-03-16 MED ORDER — MORPHINE SULFATE (PF) 2 MG/ML IV SOLN: 2.0000 mg | INTRAVENOUS | Status: DC | PRN

## 2023-03-16 MED ORDER — DIPHENHYDRAMINE HCL 50 MG/ML IJ SOLN: 12.5000 mg | INTRAMUSCULAR | Status: DC | PRN

## 2023-03-16 MED ORDER — HYDROCERIN EX CREA
TOPICAL_CREAM | Freq: Every day | CUTANEOUS | Status: DC
  Filled 2023-03-16: qty 113

## 2023-03-16 MED ORDER — SPIRONOLACTONE 25 MG PO TABS: 25.0000 mg | ORAL_TABLET | Freq: Every day | ORAL | Status: DC

## 2023-03-16 MED ORDER — BIOTENE DRY MOUTH MT LIQD
15.0000 mL | Freq: Two times a day (BID) | OROMUCOSAL | Status: DC
  Administered 2023-03-16 – 2023-03-17 (×2): 15 mL via TOPICAL

## 2023-03-16 MED ORDER — POLYVINYL ALCOHOL 1.4 % OP SOLN: 1.0000 [drp] | Freq: Four times a day (QID) | OPHTHALMIC | Status: DC | PRN

## 2023-03-16 MED ORDER — FUROSEMIDE 10 MG/ML IJ SOLN
40.0000 mg | Freq: Once | INTRAMUSCULAR | Status: AC
  Administered 2023-03-16: 40 mg via INTRAVENOUS
  Filled 2023-03-16: qty 4

## 2023-03-16 MED ORDER — LORAZEPAM 2 MG/ML PO CONC: 1.0000 mg | ORAL | Status: DC | PRN

## 2023-03-16 MED ORDER — HALOPERIDOL LACTATE 2 MG/ML PO CONC: 2.0000 mg | Freq: Four times a day (QID) | ORAL | Status: DC | PRN

## 2023-03-16 MED ORDER — GLYCOPYRROLATE 1 MG PO TABS: 1.0000 mg | ORAL_TABLET | ORAL | Status: DC | PRN

## 2023-03-16 MED ORDER — LORAZEPAM 1 MG PO TABS: 1.0000 mg | ORAL_TABLET | ORAL | Status: DC | PRN

## 2023-03-16 MED ORDER — HALOPERIDOL LACTATE 5 MG/ML IJ SOLN: 2.0000 mg | Freq: Four times a day (QID) | INTRAMUSCULAR | Status: DC | PRN

## 2023-03-16 MED ORDER — ENSURE ENLIVE PO LIQD: 237.0000 mL | ORAL | Status: DC | PRN

## 2023-03-16 MED ORDER — LACTULOSE 10 GM/15ML PO SOLN
20.0000 g | Freq: Three times a day (TID) | ORAL | Status: DC
  Administered 2023-03-16 (×2): 20 g via ORAL
  Filled 2023-03-16 (×2): qty 30

## 2023-03-16 NOTE — Consult Note (Addendum)
WOC requested to reassess bilat legs.   Refer to initial consult performed on 4/17. Right leg and heel with intact skin, no edema or erythremia or open wounds.  Dry peeling skin. Left leg with previously noted weeping has evolved into 3 areas of full thickness wounds; 1X1cm, .3X.3cm, .8X.8cm, loose eschar.  No edema or weeping, dry peeling skin. Eucerin cream ordered for dry skin. Prevalon boots in room to reduce pressure. Topical treatment orders provided for bedside nurses to perform as follows: Wound care to left leg; apply Xeroform gauze and foam dressing Q day.  Change foam dressing Q 3 days or PRN soiling. Please re-consult if further assistance is needed.  Thank-you,  Cammie Mcgee MSN, RN, CWOCN, [place], CNS [phone]

## 2023-03-16 NOTE — Progress Notes (Signed)
1910 patient alert answering questions delayed responses patient had BM complete ADL care completed

## 2023-03-16 NOTE — TOC Progression Note (Signed)
Transition of Care [facility]) - Progression Note    Patient Details  Name: Kristina Martin MRN: [id number] Date of Birth: 05-23-1946  [facility]) CM/SW Contact  Delilah Shan, LCSWA Phone Number: 03/16/2023, 11:26 AM  Clinical Narrative:     CSW received call from patients insurance [employer] confirmed SNF denial. [employer] faxed over denial letter for CSW to give to family. CSW called [name] to inform him of denial letter received. [name] gave CSW permission to drop off denial letter in patients room for him to pick up this afternoon. All questions answered. No further questions reported at this time. CSW dropped off denial letter in patients room.      Barriers to Discharge: Continued Medical Work up  Expected Discharge Plan and Services In-house Referral: Clinical Social Work     Living arrangements for the past 2 months: Single Family Home Expected Discharge Date: 03/15/23                                     Social Determinants of Health (SDOH) Interventions SDOH Screenings   Food Insecurity: No Food Insecurity (12/28/2021)  Housing: Low Risk  (12/28/2021)  Transportation Needs: No Transportation Needs (12/28/2021)  Alcohol Screen: Low Risk  (12/28/2021)  Financial Resource Strain: Low Risk  (12/28/2021)  Tobacco Use: Medium Risk (03/08/2023)    Readmission Risk Interventions     No data to display

## 2023-03-16 NOTE — Progress Notes (Addendum)
PROGRESS NOTE    Kristina Martin  [name] DOB: [birth year] DOA: 03/07/2023 PCP: Kaleen Mask, MD  77/F with chronic diastolic CHF, pulmonary hypertension, A-fib, hypertension presented to the ED with worsening edema, stopped taking her meds 2 months prior after the death of her daughter, brought to the ED by her son for lethargy, increased swelling, poor appetite and failure to thrive.  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 Moderate narrowing at the origin of the SMA.  Subjective: -More awake now, mentation improving after starting lactulose  Assessment and Plan:  Acute on chronic diastolic CHF  RV failure, pulmonary hypertension Echo with EF 60 to 65%, RV with severe enlargement, moderate reduction in RV systolic function, pulmonary hypertension, severe TR.  -Diuresed with IV Lasix this admission, repeat IV Lasix again -continue Farxiga and metoprolol,  -Aldactone held for hyperkalemia -Dispo to be determined, has significant decline from level of functioning PTA  Hepatic encephalopathy -Subacute family reports decline in mental status and lethargy ongoing for about 2 weeks -Routine labs unremarkable, ammonia level elevated at 50, started on lactulose, mental status improving, MRI brain without acute findings and ABG without hypercarbia -UA last week not suggestive of UTI -Continue lactulose  Cirrhosis of the liver -Noted on CT this admission, hypoalbuminemia and elevated ammonia secondary to chronic liver  disease, suspect either nonalcoholic fatty liver disease versus cardiac cirrhosis secondary to right heart failure -Unfortunately poor prognostic sign -Continue lactulose as noted above, diuretics as tolerated  Right renal mass -4 cm, highly suspicious for malignancy, needs further workup if aggressive care is planned  Pos Blood Cx -?Nocardia in 1/2 Blood Cx, no fever or leukocytosis, ? Real vs contaminant, ID consulting  COPD -stable  Atrial fibrillation with rapid ventricular response -Required IV diltiazem initially, now stable on oral metoprolol and apixaban  AKI (acute kidney injury) Hypo-Hyperkalemia and hyponatremia.  -Improved, stable  Hypertension -Continue blood pressure control with metoprolol.  Continue diuresis.   Left leg cellulitis -Treated with course of ceftriaxone followed by Keflex, now completed  Right renal mass -Highly suspicious for malignancy -Family was notified regarding this  Depression, major, single episode, severe -Continue with sertraline. Patient very debilitated, weak and deconditioned. 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   DVT prophylaxis: Apixaban Code Status: Full code Family Communication: None present, updated by daughter-in-law Disposition Plan: Likely home pending further workup  Consultants:    Procedures:   Antimicrobials:    Objective: Vitals:   03/15/23 2008 03/16/23 0431 03/16/23 0754 03/16/23 1304  BP: 116/75 (!) 108/95 (!) 121/97 (!) 137/105  Pulse: 85 79    Resp: 17 16 18 18   Temp: (!) 97.4 F (36.3 C) (!) 97.4 F (36.3 C) 97.6 F (36.4 C) (!) 97.4 F (36.3 C)  TempSrc: Axillary Axillary Oral Oral  SpO2: 90% 94% 90%   Weight:  43.7 kg    Height:        Intake/Output Summary (Last 24 hours) at 03/16/2023 1446 Last data filed at 03/15/2023 1642 Gross per 24 hour  Intake --  Output 600 ml  Net -600 ml   Filed Weights   03/13/23 0458  03/15/23 0400 03/16/23 0431  Weight: 67.1 kg 69.5 kg 43.7 kg    Examination:  General exam: More awake today, minimally verbal, follows some commands HEENT: + JVD CVS: S1-S2, regular rhythm Lungs: Decreased breath sounds at the bases Abdomen: Soft, nontender, bowel sounds present Extremities: Trace edema Psych: Flat affect   Data Reviewed:   CBC: Recent Labs  Lab 03/10/23 0246 03/15/23 1606 03/16/23 0312  WBC 9.1 8.1 7.3  HGB 13.1 15.1* 14.4  HCT 39.7 47.6* 44.0  MCV 88.6 90.3 89.6  PLT 159 205 171   Basic Metabolic Panel: Recent Labs  Lab 03/13/23 0244 03/14/23 0328 03/14/23 1207 03/15/23 1606 03/16/23 0312  NA 137 137 138 140 141  K 4.7 5.3* 5.0 4.4 4.1  CL 98 97* 97* 96* 97*  CO2 30 29 29 30  34*  GLUCOSE 96 92 88 121* 101*  BUN 23 29* 30* 30* 29*  CREATININE 1.04* 1.04* 1.03* 1.03* 1.09*  CALCIUM 8.9 9.2 9.5 9.1 8.8*   GFR: Estimated Creatinine Clearance: 29.8 mL/min (A) (by C-G formula based on SCr of 1.09 mg/dL (H)). Liver Function Tests: Recent Labs  Lab 03/15/23 1606 03/16/23 0312  AST 38 35  ALT 26 23  ALKPHOS 133* 116  BILITOT 2.5* 2.2*  PROT 6.2* 5.5*  ALBUMIN 3.0* 2.6*   No results for input(s): "LIPASE", "AMYLASE" in the last 168 hours. Recent Labs  Lab 03/15/23 1606  AMMONIA 50*   Coagulation Profile: No results for input(s): "INR", "PROTIME" in the last 168 hours. Cardiac Enzymes: No results for input(s): "CKTOTAL", "CKMB", "CKMBINDEX", "TROPONINI" in the last 168 hours. BNP (last 3 results) No results for input(s): "PROBNP" in the last 8760 hours. HbA1C: No results for input(s): "HGBA1C" in the last 72 hours. CBG: Recent Labs  Lab 03/10/23 0748  GLUCAP 86   Lipid Profile: No results for input(s): "CHOL", "HDL", "LDLCALC", "TRIG", "CHOLHDL", "LDLDIRECT" in the last 72 hours. Thyroid Function Tests: Recent Labs    03/15/23 1606  TSH 3.610   Anemia Panel: No results for input(s): "VITAMINB12", "FOLATE", "FERRITIN",  "TIBC", "IRON", "RETICCTPCT" in the last 72 hours. Urine analysis:    Component Value Date/Time   COLORURINE AMBER (A) 03/08/2023 0002   APPEARANCEUR HAZY (A) 03/08/2023 0002   LABSPEC 1.017 03/08/2023 0002   PHURINE 5.0 03/08/2023 0002   GLUCOSEU >=500 (A) 03/08/2023 0002   HGBUR LARGE (A) 03/08/2023 0002   BILIRUBINUR NEGATIVE 03/08/2023 0002   KETONESUR NEGATIVE 03/08/2023 0002   PROTEINUR NEGATIVE 03/08/2023 0002   UROBILINOGEN 0.2 05/15/2013 1155   NITRITE NEGATIVE 03/08/2023 0002   LEUKOCYTESUR NEGATIVE 03/08/2023 0002   Sepsis Labs: @LABRCNTIP (procalcitonin:4,lacticidven:4)  ) Recent Results (from the past 240 hour(s))  SARS Coronavirus 2 by RT PCR (hospital order, performed in [facility] hospital lab) *cepheid single result test* Anterior Nasal Swab     Status: None   Collection Time: 03/07/23  4:10 PM   Specimen: Anterior Nasal Swab  Result Value Ref Range Status   SARS Coronavirus 2 by RT PCR NEGATIVE NEGATIVE Final    Comment: Performed at [facility], [address] [place], [place] [address]  Culture, blood (routine x 2)     Status: None   Collection Time: 03/07/23  4:11 PM   Specimen: BLOOD  Result Value Ref Range Status   Specimen Description BLOOD RIGHT ANTECUBITAL  Final   Special Requests  Final    BOTTLES DRAWN AEROBIC AND ANAEROBIC Blood Culture results may not be optimal due to an inadequate volume of blood received in culture bottles   Culture   Final    NO GROWTH 5 DAYS Performed at [facility], [address], [place], [place] [address]    Report Status 03/12/2023 FINAL  Final  Culture, blood (routine x 2)     Status: None (Preliminary result)   Collection Time: 03/07/23  4:16 PM   Specimen: BLOOD  Result Value Ref Range Status   Specimen Description BLOOD SITE NOT SPECIFIED  Final   Special Requests   Final    BOTTLES DRAWN AEROBIC AND ANAEROBIC Blood Culture results may not be optimal due to an inadequate volume of blood  received in culture bottles   Culture  Setup Time   Final    AEROBIC BOTTLE ONLY GRAM POSITIVE RODS CRITICAL RESULT CALLED TO, READ BACK BY AND VERIFIED WITH:  C/ PHARMD C. AMEND 03/12/23 1637 A. LAFRANCE    Culture   Final    GRAM POSITIVE RODS CORRECTED ON 04/21 AT 1636: PREVIOUSLY REPORTED AS NO GROWTH 5 DAYS SENDING TO [facility] ID NOTIFIED A PAYTES PHARMD OF RESULT DELAY [id number] BY D VANHOOK Performed at [facility], [address], [place], [place] [address]    Report Status PENDING  Incomplete  MRSA Next Gen by PCR, Nasal     Status: None   Collection Time: 03/09/23  1:57 AM   Specimen: Nasal Mucosa; Nasal Swab  Result Value Ref Range Status   MRSA by PCR Next Gen NOT DETECTED NOT DETECTED Final    Comment: (NOTE) The GeneXpert MRSA Assay (FDA approved for NASAL specimens only), is one component of a comprehensive MRSA colonization surveillance program. It is not intended to diagnose MRSA infection nor to guide or monitor treatment for MRSA infections. Test performance is not FDA approved in patients less than 59 years old. Performed at [facility], [address] [place], [place] [address]      Radiology Studies: MR BRAIN WO CONTRAST  Result Date: 03/16/2023 CLINICAL DATA:  Initial evaluation for mental status change. EXAM: MRI HEAD WITHOUT CONTRAST TECHNIQUE: Multiplanar, multiecho pulse sequences of the brain and surrounding structures were obtained without intravenous contrast. COMPARISON:  CT from 03/12/2023. FINDINGS: Brain: Examination severely limited as the patient was unable to tolerate the full length of the exam. Additionally, provided images are severely degraded by motion artifact. Age-related cerebral atrophy with mild-to-moderate chronic microvascular ischemic disease. No visible acute or subacute ischemic infarct. Gray-white matter differentiation grossly maintained. No visible mass lesion or mass effect. No midline shift. Ventricles within  normal limits for size without hydrocephalus. No visible extra-axial fluid collection. Vascular: Not well seen on this motion degraded exam. Skull and upper cervical spine: Craniocervical junction grossly within normal limits. Bone marrow signal intensity grossly normal. No visible scalp soft tissue abnormality. Sinuses/Orbits: Not well assessed due to motion. Other: None. IMPRESSION: 1. Truncated and severely motion degraded exam. 2. No acute intracranial infarct. No other obvious intracranial abnormality. 3. Age-related cerebral atrophy with mild-to-moderate chronic microvascular ischemic disease. Electronically Signed   By: Rise Mu M.D.   On: 03/16/2023 00:50     Scheduled Meds:  apixaban  5 mg Oral BID   dapagliflozin propanediol  10 mg Oral Daily   feeding supplement  237 mL Oral BID BM   furosemide  40 mg Intravenous Once   hydrocerin  Topical Daily   lactulose  20 g Oral TID   metoprolol succinate  100 mg Oral Daily   multivitamin with minerals  1 tablet Oral Daily   sertraline  25 mg Oral Daily   Continuous Infusions:   LOS: 8 days    Time spent:    Zannie Cove, MD [facility]   03/16/2023, 2:46 PM

## 2023-03-16 NOTE — Plan of Care (Signed)
  Problem: Education: Goal: Knowledge of General Education information will improve Description: Including pain rating scale, medication(s)/side effects and non-pharmacologic comfort measures Outcome: Not Progressing   Problem: Health Behavior/Discharge Planning: Goal: Ability to manage health-related needs will improve Outcome: Not Progressing   Problem: Clinical Measurements: Goal: Ability to maintain clinical measurements within normal limits will improve Outcome: Not Progressing Goal: Will remain free from infection Outcome: Not Progressing Goal: Diagnostic test results will improve Outcome: Not Progressing Goal: Respiratory complications will improve Outcome: Not Progressing Goal: Cardiovascular complication will be avoided Outcome: Not Progressing   Problem: Activity: Goal: Risk for activity intolerance will decrease Outcome: Not Progressing   Problem: Nutrition: Goal: Adequate nutrition will be maintained Outcome: Not Progressing   Problem: Coping: Goal: Level of anxiety will decrease Outcome: Not Progressing   Problem: Elimination: Goal: Will not experience complications related to bowel motility Outcome: Not Progressing Goal: Will not experience complications related to urinary retention Outcome: Not Progressing   Problem: Pain Managment: Goal: General experience of comfort will improve Outcome: Not Progressing   Problem: Skin Integrity: Goal: Risk for impaired skin integrity will decrease Outcome: Not Progressing   Problem: Safety: Goal: Ability to remain free from injury will improve Outcome: Not Progressing   Problem: Education: Goal: Knowledge of disease or condition will improve Outcome: Not Progressing Goal: Understanding of medication regimen will improve Outcome: Not Progressing Goal: Individualized Educational Video(s) Outcome: Not Progressing   Problem: Cardiac: Goal: Ability to achieve and maintain adequate cardiopulmonary perfusion will  improve Outcome: Not Progressing   Problem: Health Behavior/Discharge Planning: Goal: Ability to safely manage health-related needs after discharge will improve Outcome: Not Progressing   Problem: Education: Goal: Knowledge of the prescribed therapeutic regimen will improve Outcome: Not Progressing   Problem: Coping: Goal: Ability to identify and develop effective coping behavior will improve Outcome: Not Progressing   Problem: Clinical Measurements: Goal: Quality of life will improve Outcome: Not Progressing   Problem: Respiratory: Goal: Verbalizations of increased ease of respirations will increase Outcome: Not Progressing   Problem: Role Relationship: Goal: Family's ability to cope with current situation will improve Outcome: Not Progressing Goal: Ability to verbalize concerns, feelings, and thoughts to partner or family member will improve Outcome: Not Progressing   Problem: Pain Management: Goal: Satisfaction with pain management regimen will improve Outcome: Not Progressing Patient total care incontinent

## 2023-03-16 NOTE — Progress Notes (Signed)
This chaplain reviewed the Pt. chart notes and consulted with PMT NP-Amber before responding to the consult for creating/updating the Pt. Advance Directive.  The chaplain understands the Pt. son is making decisions for the Pt. in collaboration with the family. The Pt. is unable to participate in AD education and complete an Advance Directive at this time.  Chaplain Stephanie Acre [phone]

## 2023-03-16 NOTE — Progress Notes (Addendum)
Daily Progress Note   Patient Name: Kristina Martin       Date: 03/16/2023 DOB: [birth year]  Age: 77 y.o. MRN#: [id number] Attending Physician: Zannie Cove, MD Primary Care Physician: Kaleen Mask, MD Admit Date: 03/07/2023  Reason for Consultation/Follow-up: Establishing goals of care  Subjective:  Received notification that patient's sister/Martha reached out to [facility] inquiring about [facility] placement today.  I have reviewed medical records including EPIC notes, MAR, and labs. Informed by hospice liaison patient's sister/Martha reached out to inquire about hospice services for patient. Received report from primary RN - no acute concerns. RN reports patient's oral intake remains poor.   11:30 AM Went to visit patient at bedside - sister/Martha present. Patient was lying in bed awake; she looks in my direction with voice/gentle touch though is not able to participate in conversation or decision making. She is non-verbal, frail, ill appearing. No signs or non-verbal gestures of pain or discomfort noted. No respiratory distress, increased work of breathing, or secretions noted.   Emotional support provided to [facility]. Therapeutic listening provided as she reviews why family called hospice this morning. Family understand insurance auth for SNF was denied - family are not able to care for her at home, indicating they "need someone to take care of her." She reflects on patient's gradual decline over the last several months in context of no safe discharge plan and their thoughts on hospice care at this point. Johnny Bridge tells me patient had a "rough" day yesterday. [name] son will be arriving to hospital around 3:30p - meeting scheduled for this time with son/William, sister/Martha, and  PMT.  3:25 PM Met patient's son/William, daughter-in-law/Quillan Whitter, sister/Martha at patient's bedside and we moved to 6E conference room.  Emotional support provided to family.  Allowed space and time for family to reflect on the course of events since patient's hospital admission, including her not being accepted to rehab.  Discussed that after peer to peer evaluation yesterday it was felt patient would not find benefit at rehab and would need custodial care.  Family tell me that they feel "confused and overwhelmed."  Allowed space and time for family to reflect patient's decline over the last several months.  [name] tells me "her health has been deteriorating" and that "the second or third day here (hospital) I could see her declining  more."  Family tell me that she has not been interested in eating "for months."  Johnny Bridge states that patient has "not talked in complete sentences since Easter" and all she says now is "yes, no, Pepsi."  [name] tells me that he "sees glimpses of her but feels that it is not her."  A detailed discussion on the difference between aggressive medical intervention and comfort care was considered in light of the patient's goals of care.  Family would not want to pursue feeding tube.  Reviewed disposition options to include: Long-term care facility placement +/- hospice, home with family +/- hospice and/or +/- private duty caregivers, residential hospice placement.  Family understand that without hospice services patient is at high risk for rehospitalization.  Family indicated that they do not have the financial resources to pay for long-term care facility out-of-pocket and she does not have insurance that would cover this cost.  Medicaid application process briefly reviewed.  Family are clear that they would not be able to provide her 24/7 care.  We reviewed the option of private duty caregivers; though, they indicate this is likely also not financially feasible.  Prognosis  reviewed.  Provided education and counseling at length on the philosophy and benefits of hospice care. Discussed that it offers a holistic approach to care in the setting of end-stage illness, and is about supporting the patient where they are allowing nature to take it's course. Discussed the hospice team includes RNs, physicians, social workers, and chaplains. They can provide personal care, support for the family, and help keep patient out of the hospital as well as assist with DME needs for home hospice. Education provided on the difference between home vs residential hospice.   Allowed space and time for family to discuss information and options as outlined above.  After discussion family are most interested in patient's transfer to residential hospice facility, requesting [facility] as this location is close to family.  Family are understandably tearful.   Per family request, we talked about transition to comfort measures in house and what that would entail inclusive of medications to control pain, dyspnea, agitation, nausea, and itching. We discussed stopping all unnecessary measures such as blood draws, needle sticks, oxygen, antibiotics, CBGs/insulin, cardiac monitoring, IVF, and frequent vital signs. Education provided that other non-pharmacological interventions would be utilized for holistic support and comfort such as spiritual support if requested, repositioning, music therapy, offering comfort feeds, and/or therapeutic listening. All care would focus on how the patient is looking and feeling.  Family opted for patient's transition to full comfort care today.   Encouraged patient/family to consider DNR/DNI status understanding evidenced based poor outcomes in similar hospitalized patient, as the cause of arrest is likely associated with advanced chronic/terminal illness rather than an easily reversible acute cardio-pulmonary event.  I shared that even if we pursued resuscitation we  would not able to resolve the underlying factors. I explained that DNR/DNI does not change the medical plan and it only comes into effect after a person has arrested (died).  It is a protective measure to keep [place] from harming the patient in their last moments of life. Family were agreeable to DNR/DNI with understanding that she would not receive CPR, defibrillation, ACLS medications, or intubation.   All questions and concerns addressed. Encouraged to call with questions and/or concerns. PMT card provided.  Updated [facility] liaison family have chosen a different hospice facility.  Length of Stay: 8  Current Medications: Scheduled Meds:   apixaban  5  mg Oral BID   dapagliflozin propanediol  10 mg Oral Daily   feeding supplement  237 mL Oral BID BM   lactulose  20 g Oral TID   metoprolol succinate  100 mg Oral Daily   multivitamin with minerals  1 tablet Oral Daily   sertraline  25 mg Oral Daily    Continuous Infusions:   PRN Meds: acetaminophen **OR** acetaminophen, albuterol, ondansetron **OR** ondansetron (ZOFRAN) IV  Physical Exam Vitals and nursing note reviewed.  Constitutional:      General: She is not in acute distress.    Appearance: She is ill-appearing.  Pulmonary:     Effort: No respiratory distress.  Skin:    General: Skin is warm and dry.  Neurological:     Mental Status: She is alert.     Motor: Weakness present.  Psychiatric:        Behavior: Behavior is cooperative.             Vital Signs: BP (!) 121/97 (BP Location: Left Arm)   Pulse 79   Temp 97.6 F (36.4 C) (Oral)   Resp 18   Ht  (1.575 m)   Wt 43.7 kg   SpO2 90%   BMI 17.62 kg/m  SpO2: SpO2: 90 % O2 Device: O2 Device: Room Air O2 Flow Rate: O2 Flow Rate (L/min): 3 L/min  Intake/output summary:  Intake/Output Summary (Last 24 hours) at 03/16/2023 1118 Last data filed at 03/15/2023 1642 Gross per 24 hour  Intake --  Output 600 ml  Net -600 ml   LBM: Last BM Date : 03/11/23 Baseline  Weight: Weight: 62 kg Most recent weight: Weight: 43.7 kg       Palliative Assessment/Data: PPS 20-30%      Patient Active Problem List   Diagnosis Date Noted   Depression, major, single episode, severe 03/09/2023   Atrial fibrillation with rapid ventricular response 03/08/2023   Left leg cellulitis 03/08/2023   AKI (acute kidney injury) 03/08/2023   Right renal mass 03/08/2023   Hyperbilirubinemia 03/08/2023   Mitral regurgitation 01/24/2022   Secondary hypercoagulable state 01/05/2022   Acute on chronic diastolic CHF (congestive heart failure)    Persistent atrial fibrillation 12/22/2021   Hypokalemia 12/22/2021   Anemia 12/22/2021   Tobacco abuse 12/22/2021   Fibroid    Hypertension     Palliative Care Assessment & Plan   Patient Profile: 77 y.o. female with past medical history of diastolic heart failure last Echo March 2023 with EF of 55-60%, COPD, A-fib, HTN admitted on 03/07/2023 with complaints of progressive weakness, altered mental status, weeping leg edema. Workup revealed a-fib with RVR- started on cardizem drip with rate now improving, lower extremity cellulitis- being treated with antibiotics, heart failure exacerbation- diuresing, and acute kidney injury with Cr at 1.24. CT head with no acute findings. 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 Palliative consult received for "end of life".   Assessment: Principal Problem:   Acute on chronic diastolic CHF (congestive heart failure) Active Problems:   Hypertension   Atrial fibrillation with rapid ventricular response   Left leg cellulitis   AKI (acute kidney injury)   Right renal mass   Depression, major, single episode, severe   Recommendations/Plan: Initiated full comfort measures Now DNR/DNI - durable DNR form completed and placed in shadow chart.  Copy was made and  will be scanned into Vynca/ACP tab Family requesting residential hosp[facility] placement at [facility] notified and consult placed Continue palliative wound care Added orders for EOL symptom management and to reflect full comfort measures, as well as discontinued orders that were not focused on comfort Unrestricted visitation orders were placed per current [place] EOL visitation policy  Nursing to provide frequent assessments and administer PRN medications as clinically necessary to ensure EOL comfort PMT will continue to follow and support holistically  Symptom Management Morphine PRN pain/dyspnea/increased work of breathing/RR>25 Tylenol PRN pain/fever Biotin twice daily Benadryl PRN itching Robinul PRN secretions Haldol PRN agitation/delirium Ativan PRN anxiety/seizure/sleep/distress Zofran PRN nausea/vomiting Liquifilm Tears PRN dry eye   Goals of Care and Additional Recommendations: Limitations on Scope of Treatment: Full Comfort Care  Code Status:    Code Status Orders  (From admission, onward)           Start     Ordered   03/08/23 0137  Full code  Continuous       Question:  By:  Answer:  Consent: discussion documented in EHR   03/08/23 0137           Code Status History     Date Active Date Inactive Code Status Order ID Comments User Context   03/08/2023 0116 03/08/2023 0137 Full Code [id number]  Carollee Herter, DO ED   12/22/2021 1350 12/28/2021 1933 Full Code [id number]  Orland Mustard, MD Inpatient       Prognosis:  < 2 weeks  Discharge Planning: Hospice facility  Care plan was discussed with primary RN, patient's family, Dr. Drue Novel, [facility] liaison  Thank you for allowing the Palliative Medicine Team to assist in the care of this patient.   Total Time 70 minutes Prolonged Time Billed  yes       Greater than 50%  of this time was spent counseling and coordinating care related to the above assessment and  plan.  Haskel Khan, NP  Please contact Palliative Medicine Team phone at [phone] for questions and concerns.   *Portions of this note are a verbal dictation therefore any spelling and/or grammatical errors are due to the "[employer]agon [employer] One" system interpretation.

## 2023-03-16 NOTE — Evaluation (Signed)
Clinical/Bedside Swallow Evaluation Patient Details  Name: Kristina Martin MRN: [id number] Date of Birth: 14-Jun-1946  Today's Date: 03/16/2023 Time: SLP Start Time (ACUTE ONLY): 1310 SLP Stop Time (ACUTE ONLY): 1330 SLP Time Calculation (min) (ACUTE ONLY): 20 min  Past Medical History:  Past Medical History:  Diagnosis Date   A-fib    per pt   Fibroid    Hypertension    Past Surgical History:  Past Surgical History:  Procedure Laterality Date   CARDIOVERSION N/A 01/25/2022   Procedure: CARDIOVERSION;  Surgeon: Little Ishikawa, MD;  Location[facility] ENDOSCOPY;  Service: Cardiovascular;  Laterality: N/A;   CESAREAN SECTION     CHOLECYSTECTOMY     DILATION AND CURETTAGE OF UTERUS     HYSTEROSCOPY     HPI:  Patient is a 77 year old female with complaints of progressive weakness, AMS, weeping leg edema. Found to have A-fib with RVR, LE cellulitis, and acute on chronic heart failure. PMH - chf, htn, afib, depression.    Assessment / Plan / Recommendation  Clinical Impression  Patient presents with what appears to be an intact oropharyngeal swallow. Swallow evaluation complete but limited as patient did not follow commands for carryout of thorough oral motor evaluation. She was however consistently able to consume thin liquids via mulitple consecutive straw sips of tight labial seal and full oral containment and clearance of bolus indicative of at least some degree of intact oral strength and coordination. With solids however, patient either does not open oral cavity or only opens slightly despite max verbal, tactile, and visual cueing. Eventually, she consumed 2-3 very small (maybe 1/4 tsp) boluses of pureed solid and one small bite of regular texture solid, again with no overt s/s of aspiraiton and full oral clearance.  When asked if there was any solid foods she would consume she stated "no" but requested "Coke" which again she was able to consume without difficulty. Discussed possiblity  of increasing po intake by focusing primary on liquids, sister reported that she attemtped Ensure and patient only consumed a few sips but then went on to drink soda again eagerly. Although with acute medical condition and presentation, there is likely a cognitive factor to limited po intake, patient does appear to be choosing to consume those pos that she wishes and refusing those that she does not wish to consume. Sister reports that po intake began to decline after patient's daughter passed away months prior to admission. Recommend continuation of current diet, encouraging patient to consume as many pos as possible. Could attempt to focus on high calorie liquids via straw if this is patient's preference. Recommend dietary assistance. SLP will f/u for diet tolerance and potential improvements with improved mentation. SLP Visit Diagnosis: Dysphagia, unspecified (R13.10)       Diet Recommendation Regular;Thin liquid   Liquid Administration via: Cup;Straw Medication Administration: Whole meds with liquid Supervision: Patient able to self feed Postural Changes: Seated upright at 90 degrees    Other  Recommendations Oral Care Recommendations: Oral care BID    Recommendations for follow up therapy are one component of a multi-disciplinary discharge planning process, led by the attending physician.  Recommendations may be updated based on patient status, additional functional criteria and insurance authorization.  Follow up Recommendations No SLP follow up      Assistance Recommended at Discharge    Functional Status Assessment    Frequency and Duration min 1 x/week  1 week        Swallow Study  General HPI: Patient is a 77 year old female with complaints of progressive weakness, AMS, weeping leg edema. Found to have A-fib with RVR, LE cellulitis, and acute on chronic heart failure. PMH - chf, htn, afib, depression. Type of Study: Bedside Swallow Evaluation Previous Swallow Assessment:  none Diet Prior to this Study: Regular;Thin liquids (Level 0) Temperature Spikes Noted: No Respiratory Status: Room air History of Recent Intubation: No Behavior/Cognition: Requires cueing Oral Cavity Assessment:  (unable to visualize) Oral Care Completed by SLP: No Oral Cavity - Dentition:  (dentures, patient declining to place) Vision: Functional for self-feeding Self-Feeding Abilities: Needs assist Patient Positioning: Upright in bed Baseline Vocal Quality: Normal Volitional Cough: Cognitively unable to elicit Volitional Swallow: Unable to elicit    Oral/Motor/Sensory Function Overall Oral Motor/Sensory Function:  (see impressions)   Ice Chips Ice chips: Not tested   Thin Liquid Thin Liquid: Within functional limits Presentation: Straw    Nectar Thick Nectar Thick Liquid: Not tested   Honey Thick Honey Thick Liquid: Not tested   Puree Puree: Within functional limits Presentation: Spoon   Solid     Solid: Within functional limits     Jamee Pacholski MA, CCC-SLP  Branae Crail Meryl 03/16/2023,1:41 PM

## 2023-03-16 NOTE — Consult Note (Signed)
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 female admitted from home with 2 weeks of progressively worsened lethargy and decline of mental status in addition to dependent edema, redness in her left foot/leg and overall FTT. She was found to have gram positive rods growing in one of the collected blood cultures that we preliminarily believe to be a nocardia species; further identification sent to [facility] for confirmation/speciation and susceptibility testing.   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  -Traditionally would need a longer course of treatment (months) and depending on overall prognosis re: EOL not sure we should put her through potentially toxic side effects for tx.   A/C kidney disease- -would avoid bactrim with CrCl < 30   Hepatic Cirrhosis -  -this would be her risk factor for atypical bacterial infection -ongoing EOL discussions   Will touch base with Dr. Jomarie Longs and see where they are regarding care decisions.      Plan: IF tx desired, would start Imipenem + Linezolid  Follow up GOC / EOL discussions    Principal Problem:   Acute on chronic diastolic CHF (congestive heart failure) Active Problems:   Hypertension   Atrial fibrillation with rapid ventricular response    Left leg cellulitis   AKI (acute kidney injury)   Right renal mass   Depression, major, single episode, severe    apixaban  5 mg Oral BID   dapagliflozin propanediol  10 mg Oral Daily   feeding supplement  237 mL Oral BID BM   hydrocerin   Topical Daily   lactulose  20 g Oral TID   metoprolol succinate  100 mg Oral Daily   multivitamin with minerals  1 tablet Oral Daily   sertraline  25 mg Oral Daily    HPI: ADYLENE DLUGOSZ is a 77 y.o. female admitted from home with 2 weeks of decline in mental status and lethargy.   H/O CHF, PAH, AFib, HTN, hepatic cirrhosis. Was not taking her chronic medications for about 41m prior to presentation after her daughter passed away. Her son brought her to the ER after notable changes with lethargy, swelling and overall FTT  Cintya is not able / willing to provide history and only answers occasional simple questions. In discussion with her nurse today this may be her baseline.  Her family is not present as they are all in a PMT / GOC meeting at the time of our visit.   She presented with mild leukocytosis and blood cultures collected. Infection thought to be 2/2 cellulitis. Received course of IV ceftriaxone >> cephalexin with improvement. Chest CT was done revealing no acute problems, chronic emphysema.    Review of Systems: ROS  Past Medical History:  Diagnosis Date   A-fib    per pt   Fibroid    Hypertension     Social History   Tobacco Use   Smoking status: Former    Packs/day: .25    Types: Cigarettes   Smokeless tobacco: Never   Tobacco comments:    Former smoker. 2 cigarettes/day. Started at age 2. Quit smoking jan 2022  Vaping Use   Vaping Use: Never used  Substance Use Topics   Alcohol use: No   Drug use: No    Family History  Problem Relation Age of Onset   Osteoporosis Mother    Hypertension Father    Diabetes Brother    Hypertension Brother    Hypertension Brother    Heart disease Brother    No Known  Allergies  OBJECTIVE: Blood pressure (!) 137/105, pulse 79, temperature (!) 97.4 F (36.3 C), temperature source Oral, resp. rate 18, height  (1.575 m), weight 43.7 kg, SpO2 90 %.  Physical Exam Constitutional:      General: She is not in acute distress.    Appearance: She is ill-appearing.  Musculoskeletal:        General: Swelling present.     Comments: See photo, erythema has improved since her initial presentation   Neurological:     Mental Status: She is alert.  Psychiatric:     Comments: Answers only a few questions       Lab Results Lab Results  Component Value Date   WBC 7.3 03/16/2023   HGB 14.4 03/16/2023   HCT 44.0 03/16/2023   MCV 89.6 03/16/2023   PLT 171 03/16/2023    Lab Results  Component Value Date   CREATININE 1.09 (H) 03/16/2023   BUN 29 (H) 03/16/2023   NA 141 03/16/2023   K 4.1 03/16/2023   CL 97 (L) 03/16/2023   CO2 34 (H) 03/16/2023    Lab Results  Component Value Date   ALT 23 03/16/2023   AST 35 03/16/2023   ALKPHOS 116 03/16/2023   BILITOT 2.2 (H) 03/16/2023     Microbiology: Recent Results (from the past 240 hour(s))  SARS Coronavirus 2 by RT PCR (hospital order, performed in [facility] hospital lab) *cepheid single result test* Anterior Nasal Swab     Status: None   Collection Time: 03/07/23  4:10 PM   Specimen: Anterior Nasal Swab  Result Value Ref Range Status   SARS Coronavirus 2 by RT PCR NEGATIVE NEGATIVE Final    Comment: Performed at [facility] Lab, [address] [place], [place] [address]  Culture, blood (routine x 2)     Status: None   Collection Time: 03/07/23  4:11 PM   Specimen: BLOOD  Result Value Ref Range Status   Specimen Description BLOOD RIGHT ANTECUBITAL  Final   Special Requests   Final    BOTTLES DRAWN AEROBIC AND ANAEROBIC Blood Culture results may not be optimal due to an inadequate volume of blood received in culture bottles   Culture   Final    NO GROWTH 5 DAYS Performed at [facility], [address] [place], [place] [address]    Report Status 03/12/2023 FINAL  Final  Culture, blood (routine x 2)     Status: None (Preliminary result)   Collection Time: 03/07/23  4:16 PM   Specimen: BLOOD  Result Value Ref Range Status   Specimen Description BLOOD SITE NOT SPECIFIED  Final   Special Requests   Final  BOTTLES DRAWN AEROBIC AND ANAEROBIC Blood Culture results may not be optimal due to an inadequate volume of blood received in culture bottles   Culture  Setup Time   Final    AEROBIC BOTTLE ONLY GRAM POSITIVE RODS CRITICAL RESULT CALLED TO, READ BACK BY AND VERIFIED WITH:  C/ PHARMD C. AMEND 03/12/23 1637 A. LAFRANCE    Culture   Final    GRAM POSITIVE RODS CORRECTED ON 04/21 AT 1636: PREVIOUSLY REPORTED AS NO GROWTH 5 DAYS SENDING TO [facility] ID NOTIFIED A PAYTES PHARMD OF RESULT DELAY [id number] BY D VANHOOK Performed at [facility], [address] [place], [place] [address]    Report Status PENDING  Incomplete  MRSA Next Gen by PCR, Nasal     Status: None   Collection Time: 03/09/23  1:57 AM   Specimen: Nasal Mucosa; Nasal Swab  Result Value Ref Range Status   MRSA by PCR Next Gen NOT DETECTED NOT DETECTED Final    Comment: (NOTE) The GeneXpert MRSA Assay (FDA approved for NASAL specimens only), is one component of a comprehensive MRSA colonization surveillance program. It is not intended to diagnose MRSA infection nor to guide or monitor treatment for MRSA infections. Test performance is not FDA approved in patients less than 21 years old. Performed at [facility], [address], [place], [place] [address]      Rexene Alberts, MSN, NP-C [facility]  [email or web address] Pager: [phone] Office: [phone] [facility] Main Line: [phone] *Secure Chat Communication Welcome

## 2023-03-17 DIAGNOSIS — R600 Localized edema: Secondary | ICD-10-CM | POA: Diagnosis not present

## 2023-03-17 DIAGNOSIS — I509 Heart failure, unspecified: Secondary | ICD-10-CM | POA: Diagnosis not present

## 2023-03-17 DIAGNOSIS — G9340 Encephalopathy, unspecified: Secondary | ICD-10-CM | POA: Diagnosis not present

## 2023-03-17 DIAGNOSIS — I5033 Acute on chronic diastolic (congestive) heart failure: Secondary | ICD-10-CM | POA: Diagnosis not present

## 2023-03-17 MED ORDER — MORPHINE SULFATE (CONCENTRATE) 20 MG/ML PO SOLN: 5.0000 mg | ORAL | 0 refills | Status: DC | PRN

## 2023-03-17 NOTE — Progress Notes (Signed)
Daily Progress Note   Patient Name: Kristina Martin       Date: 03/17/2023 DOB: [birth year]  Age: 77 y.o. MRN#: [id number] Attending Physician: Zannie Cove, MD Primary Care Physician: Kaleen Mask, MD Admit Date: 03/07/2023  Reason for Consultation/Follow-up: Non pain symptom management, Pain control, Psychosocial/spiritual support, and Terminal Care  Subjective: I have reviewed medical records including EPIC notes, MAR, and labs. Received report from primary RN - no acute concerns.  Per RN, oral intake remains poor.  Went visit patient at bedside -no family/visitors present.  Patient was lying in bed awake and oriented to name only.  She is able to answer simple questions though unsure if she is answering appropriately - sips of Pepsi provided per her request. No signs or non-verbal gestures of pain or discomfort noted. No respiratory distress, increased work of breathing, or secretions noted.   10:22 AM Received notification son/William called PMT phone with request to return call.  Called [name] -emotional support provided.  [name] tells me that he and his family were able to tour [facility] this morning and they were very pleased with the facility and with the staff.  He states that he is "happy patient can be closer to family (12 minutes away) and the people are super sweet." [name] expresses deep gratitude for PMT and chaplain assistance in assisting him/family in navigating difficult and complex decisions.  All questions and concerns addressed. Encouraged to call with questions and/or concerns. PMT card previously provided.   Length of Stay: 9  Current Medications: Scheduled Meds:   antiseptic oral rinse  15 mL Topical BID   hydrocerin   Topical Daily    metoprolol succinate  100 mg Oral Daily    Continuous Infusions:   PRN Meds: acetaminophen **OR** acetaminophen, albuterol, diphenhydrAMINE, feeding supplement, glycopyrrolate **OR** glycopyrrolate **OR** glycopyrrolate, haloperidol **OR** haloperidol **OR** haloperidol lactate, LORazepam **OR** LORazepam, morphine injection, ondansetron **OR** ondansetron (ZOFRAN) IV, polyvinyl alcohol  Physical Exam Vitals and nursing note reviewed.  Constitutional:      General: She is not in acute distress.    Appearance: She is ill-appearing.  Pulmonary:     Effort: No respiratory distress.  Skin:    General: Skin is warm and dry.  Neurological:     Mental Status: She is alert.     Motor:  Weakness present.  Psychiatric:        Behavior: Behavior is cooperative.             Vital Signs: BP 113/89 (BP Location: Left Arm)   Pulse 84   Temp (!) 97.3 F (36.3 C) (Axillary)   Resp 17   Ht 5\' 2"  (1.575 m)   Wt 43.7 kg   SpO2 95%   BMI 17.62 kg/m  SpO2: SpO2: 95 % O2 Device: O2 Device: Room Air O2 Flow Rate: O2 Flow Rate (L/min): 3 L/min  Intake/output summary:  Intake/Output Summary (Last 24 hours) at 03/17/2023 1610 Last data filed at 03/17/2023 0100 Gross per 24 hour  Intake 60 ml  Output 700 ml  Net -640 ml   LBM: Last BM Date : 03/16/23 Baseline Weight: Weight: 62 kg Most recent weight: Weight: 43.7 kg       Palliative Assessment/Data: PPS 20%      Patient Active Problem List   Diagnosis Date Noted   Depression, major, single episode, severe (HCC) 03/09/2023   Atrial fibrillation with rapid ventricular response (HCC) 03/08/2023   Left leg cellulitis 03/08/2023   AKI (acute kidney injury) (HCC) 03/08/2023   Right renal mass 03/08/2023   Hyperbilirubinemia 03/08/2023   Mitral regurgitation 01/24/2022   Secondary hypercoagulable state (HCC) 01/05/2022   Acute on chronic diastolic CHF (congestive heart failure) (HCC)    Persistent atrial fibrillation (HCC) 12/22/2021    Hypokalemia 12/22/2021   Anemia 12/22/2021   Tobacco abuse 12/22/2021   Fibroid    Hypertension     Palliative Care Assessment & Plan   Patient Profile: 77 y.o. female with past medical history of diastolic heart failure last Echo March 2023 with EF of 55-60%, COPD, A-fib, HTN admitted on 03/07/2023 with complaints of progressive weakness, altered mental status, weeping leg edema. Workup revealed a-fib with RVR- started on cardizem drip with rate now improving, lower extremity cellulitis- being treated with antibiotics, heart failure exacerbation- diuresing, and acute kidney injury with Cr at 1.24. CT head with no acute findings. 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 Palliative consult received for "end of life".   Assessment: Principal Problem:   Acute on chronic diastolic CHF (congestive heart failure) (HCC) Active Problems:   Hypertension   Atrial fibrillation with rapid ventricular response (HCC)   Left leg cellulitis   AKI (acute kidney injury) (HCC)   Right renal mass   Depression, major, single episode, severe (HCC)   Terminal care  Recommendations/Plan: Continue full comfort measures Continue DNR/DNI as previously documented Transfer to [facility] - per hospice liaison they do have beds available today Continue palliative wound care Continue current comfort focused medication regimen - no changes PMT will continue to follow and support holistically  Symptom Management Morphine PRN pain/dyspnea/increased work of breathing/RR>25 Tylenol PRN pain/fever Biotin twice daily Benadryl PRN itching Robinul PRN secretions Haldol PRN agitation/delirium Ativan PRN anxiety/seizure/sleep/distress Zofran PRN nausea/vomiting Liquifilm Tears PRN dry eye  Goals of Care and Additional  Recommendations: Limitations on Scope of Treatment: Full Comfort Care  Code Status:    Code Status Orders  (From admission, onward)           Start     Ordered   03/16/23 1639  Do not attempt resuscitation (DNR)  Continuous       Question Answer Comment  If  patient has no pulse and is not breathing Do Not Attempt Resuscitation   If patient has a pulse and/or is breathing: Medical Treatment Goals COMFORT MEASURES: Keep clean/warm/dry, use medication by any route; positioning, wound care and other measures to relieve pain/suffering; use oxygen, suction/manual treatment of airway obstruction for comfort; do not transfer unless for comfort needs.   Consent: Discussion documented in EHR or advanced directives reviewed      03/16/23 1641           Code Status History     Date Active Date Inactive Code Status Order ID Comments User Context   03/16/2023 1624 03/16/2023 1641 DNR [id number]  Haskel Khan, NP Inpatient   03/08/2023 0137 03/16/2023 1624 Full Code [id number]  Carollee Herter, DO ED   03/08/2023 0116 03/08/2023 0137 Full Code [id number]  Carollee Herter, DO ED   12/22/2021 1350 12/28/2021 1933 Full Code [id number]  Orland Mustard, MD Inpatient       Prognosis:  < 2 weeks  Discharge Planning: Hospice facility  Care plan was discussed with primary RN, patient's son, [facility], hospice liaison, Dr. Jomarie Longs  Thank you for allowing the Palliative Medicine Team to assist in the care of this patient.  Haskel Khan, NP  Please contact Palliative Medicine Team phone at [phone] for questions and concerns.   *Portions of this note are a verbal dictation therefore any spelling and/or grammatical errors are due to the "[employer]agon [employer] One" system interpretation.

## 2023-03-17 NOTE — Progress Notes (Signed)
   The pt was referred to hospice services for terminal illness and comfort care. The family did tour the [facility] facility this am as well. We have reviewed the chart, spoken to the family and discussed with our MD. The pt has been approved for hospice services. We do have a bed to offer and the family has accepted bed offer.   Norm Parcel RN [phone]

## 2023-03-17 NOTE — Plan of Care (Signed)
Problem: Education: Goal: Knowledge of General Education information will improve Description: Including pain rating scale, medication(s)/side effects and non-pharmacologic comfort measures 03/17/2023 1330 by Herma Carson, RN Outcome: Adequate for Discharge 03/17/2023 0803 by Herma Carson, RN Outcome: Progressing   Problem: Health Behavior/Discharge Planning: Goal: Ability to manage health-related needs will improve 03/17/2023 1330 by Herma Carson, RN Outcome: Adequate for Discharge 03/17/2023 0803 by Herma Carson, RN Outcome: Progressing   Problem: Clinical Measurements: Goal: Ability to maintain clinical measurements within normal limits will improve 03/17/2023 1330 by Herma Carson, RN Outcome: Adequate for Discharge 03/17/2023 0803 by Herma Carson, RN Outcome: Progressing Goal: Will remain free from infection 03/17/2023 1330 by Herma Carson, RN Outcome: Adequate for Discharge 03/17/2023 0803 by Herma Carson, RN Outcome: Progressing Goal: Diagnostic test results will improve 03/17/2023 1330 by Herma Carson, RN Outcome: Adequate for Discharge 03/17/2023 0803 by Herma Carson, RN Outcome: Progressing Goal: Respiratory complications will improve 03/17/2023 1330 by Herma Carson, RN Outcome: Adequate for Discharge 03/17/2023 0803 by Herma Carson, RN Outcome: Progressing Goal: Cardiovascular complication will be avoided 03/17/2023 1330 by Herma Carson, RN Outcome: Adequate for Discharge 03/17/2023 0803 by Herma Carson, RN Outcome: Progressing   Problem: Activity: Goal: Risk for activity intolerance will decrease 03/17/2023 1330 by Herma Carson, RN Outcome: Adequate for Discharge 03/17/2023 0803 by Herma Carson, RN Outcome: Progressing   Problem: Nutrition: Goal: Adequate nutrition will be maintained 03/17/2023 1330 by Herma Carson, RN Outcome: Adequate for Discharge 03/17/2023 0803 by Herma Carson, RN Outcome: Progressing    Problem: Coping: Goal: Level of anxiety will decrease 03/17/2023 1330 by Herma Carson, RN Outcome: Adequate for Discharge 03/17/2023 0803 by Herma Carson, RN Outcome: Progressing   Problem: Elimination: Goal: Will not experience complications related to bowel motility 03/17/2023 1330 by Herma Carson, RN Outcome: Adequate for Discharge 03/17/2023 0803 by Herma Carson, RN Outcome: Progressing Goal: Will not experience complications related to urinary retention 03/17/2023 1330 by Herma Carson, RN Outcome: Adequate for Discharge 03/17/2023 0803 by Herma Carson, RN Outcome: Progressing   Problem: Pain Managment: Goal: General experience of comfort will improve 03/17/2023 1330 by Herma Carson, RN Outcome: Adequate for Discharge 03/17/2023 0803 by Herma Carson, RN Outcome: Progressing   Problem: Safety: Goal: Ability to remain free from injury will improve 03/17/2023 1330 by Herma Carson, RN Outcome: Adequate for Discharge 03/17/2023 0803 by Herma Carson, RN Outcome: Progressing   Problem: Skin Integrity: Goal: Risk for impaired skin integrity will decrease 03/17/2023 1330 by Herma Carson, RN Outcome: Adequate for Discharge 03/17/2023 0803 by Herma Carson, RN Outcome: Progressing   Problem: Education: Goal: Knowledge of disease or condition will improve 03/17/2023 1330 by Herma Carson, RN Outcome: Adequate for Discharge 03/17/2023 0803 by Herma Carson, RN Outcome: Progressing Goal: Understanding of medication regimen will improve 03/17/2023 1330 by Herma Carson, RN Outcome: Adequate for Discharge 03/17/2023 0803 by Herma Carson, RN Outcome: Progressing Goal: Individualized Educational Video(s) 03/17/2023 1330 by Herma Carson, RN Outcome: Adequate for Discharge 03/17/2023 0803 by Herma Carson, RN Outcome: Progressing   Problem: Activity: Goal: Ability to tolerate increased activity will improve 03/17/2023 1330 by Herma Carson,  RN Outcome: Adequate for Discharge 03/17/2023 0803 by Herma Carson, RN Outcome: Progressing   Problem: Cardiac: Goal: Ability to achieve and maintain adequate cardiopulmonary perfusion will improve 03/17/2023 1330 by  Herma Carson, RN Outcome: Adequate for Discharge 03/17/2023 1610 by Herma Carson, RN Outcome: Progressing   Problem: Health Behavior/Discharge Planning: Goal: Ability to safely manage health-related needs after discharge will improve 03/17/2023 1330 by Herma Carson, RN Outcome: Adequate for Discharge 03/17/2023 0803 by Herma Carson, RN Outcome: Progressing   Problem: Education: Goal: Knowledge of the prescribed therapeutic regimen will improve 03/17/2023 1330 by Herma Carson, RN Outcome: Adequate for Discharge 03/17/2023 0803 by Herma Carson, RN Outcome: Progressing   Problem: Coping: Goal: Ability to identify and develop effective coping behavior will improve 03/17/2023 1330 by Herma Carson, RN Outcome: Adequate for Discharge 03/17/2023 0803 by Herma Carson, RN Outcome: Progressing   Problem: Clinical Measurements: Goal: Quality of life will improve 03/17/2023 1330 by Herma Carson, RN Outcome: Adequate for Discharge 03/17/2023 0803 by Herma Carson, RN Outcome: Progressing   Problem: Respiratory: Goal: Verbalizations of increased ease of respirations will increase 03/17/2023 1330 by Herma Carson, RN Outcome: Adequate for Discharge 03/17/2023 0803 by Herma Carson, RN Outcome: Progressing   Problem: Role Relationship: Goal: Family's ability to cope with current situation will improve 03/17/2023 1330 by Herma Carson, RN Outcome: Adequate for Discharge 03/17/2023 0803 by Herma Carson, RN Outcome: Progressing Goal: Ability to verbalize concerns, feelings, and thoughts to partner or family member will improve 03/17/2023 1330 by Herma Carson, RN Outcome: Adequate for Discharge 03/17/2023 0803 by Herma Carson, RN Outcome:  Progressing   Problem: Pain Management: Goal: Satisfaction with pain management regimen will improve 03/17/2023 1330 by Herma Carson, RN Outcome: Adequate for Discharge 03/17/2023 0803 by Herma Carson, RN Outcome: Progressing

## 2023-03-17 NOTE — Discharge Summary (Signed)
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   Atrial fibrillation with rapid ventricular response (HCC)   AKI (acute kidney injury) (HCC)   Hypertension   Left leg cellulitis   Right renal mass   Depression, major, single episode, severe (HCC)   Discharge Condition: Guarded  Diet recommendation: Comfort feeds  Filed Weights   03/13/23 0458 03/15/23 0400 03/16/23 0431  Weight: 67.1 kg 69.5 kg 43.7 kg    History of present illness:  76/F with chronic diastolic CHF, pulmonary hypertension, A-fib, hypertension presented to the ED with worsening edema, stopped taking her meds 2 months prior after the death of her daughter, brought to the ED by her son for lethargy, increased swelling, poor appetite and failure to thrive.  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 Moderate narrowing at the origin of the SMA.  Hospital Course:   Acute on chronic diastolic CHF  RV failure,  pulmonary hypertension Echo with EF 60 to 65%, RV with severe enlargement, moderate reduction in RV systolic function, pulmonary hypertension, severe TR.  -Diuresed with IV Lasix this admission, continued on Farxiga and metoprolol as well, Aldactone was held for hyperkalemia -On account of hepatic encephalopathy, cirrhosis, right heart failure, pulmonary hypertension, renal mass and ongoing failure to thrive she was seen by palliative care, family meeting was completed, decision was made for comfort focused care -She will discharge to [facility] which was picked up by family  Hepatic encephalopathy -Subacute family reports decline in mental status and lethargy ongoing for about 2 weeks -Routine labs unremarkable, ammonia level elevated at 50, started on lactulose, mental status improving, MRI brain without acute findings and ABG without hypercarbia -UA last week not suggestive of UTI -Slight improvement with lactulose, now comfort care   Cirrhosis of the liver -Noted on CT this admission, hypoalbuminemia and elevated ammonia secondary to chronic liver disease, suspect either nonalcoholic fatty liver disease versus cardiac cirrhosis secondary to right heart failure -Unfortunately poor prognostic sign -Treated with lactulose and diuretics initially, now comfort care   Right renal mass -4 cm, highly suspicious for malignancy, needs further workup if aggressive care is planned   Pos Blood Cx -?Nocardia in 1/2 Blood Cx, no fever or leukocytosis, ? Real vs contaminant, seen by ID, recommended antibiotics if further treatment planned   COPD -stable   Atrial fibrillation with rapid ventricular response -Required IV diltiazem initially, then stable on oral metoprolol and apixaban   AKI (acute kidney injury) Hypo-Hyperkalemia and hyponatremia.  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      Discharge Exam: Vitals:   03/17/23 0000 03/17/23 0416  BP: 133/84 113/89  Pulse: 85 84  Resp: 16 17  Temp: (!) 97.1 F (36.2 C) (!) 97.3 F (36.3 C)  SpO2: 96% 95%   General exam: More awake today, minimally verbal, follows some commands HEENT: + JVD CVS: S1-S2, regular rhythm Lungs: Decreased breath sounds at the bases Abdomen: Soft, nontender, bowel sounds present Extremities: Trace edema Psych: Flat affect    Discharge Instructions   Discharge Instructions     Diet - low sodium heart healthy   Complete by: As directed    Diet general   Complete by: As directed    Discharge instructions   Complete by: As directed    Please follow up with primary care in 7 to 10 days.   Discharge wound care:   Complete by: As directed    Pressure Injury 03/11/23 Toe (Comment  which one) Left Stage 2 -  Partial thickness loss of dermis presenting as a shallow open injury with a red, pink wound bed without slough. pressure ulcer on anterior lateral of great toe where next toe is compression     Wound care  Daily      Comments: Wound care to bilateral LEs (L>R):  Wash with soap and water, particularly between digits. Use gauze to gently cleanse between digits. Rinse and dry gently but thoroughly. Apply single layer of xeroform gauze [employer] [place] # 294) to areas of weeping. Top with ABD pad. Secure with Kerlix roll gauze applied from just below toes to just below knee/secure with paper tape. Top Kerlix with a 6-inch ACE bandage applied in a manner similar to Kerlix (toe to knee). Place foot (feet) into Prevalon boots.   Discharge wound care:   Complete by: As directed    routine   Increase activity slowly   Complete by: As directed    Increase activity slowly   Complete by: As directed       Allergies as of 03/17/2023   No Known Allergies      Medication List     STOP taking  these medications    albuterol 108 (90 Base) MCG/ACT inhaler Commonly known as: VENTOLIN HFA   CALCIUM + D PO   Eliquis 5 MG Tabs tablet Generic drug: apixaban   Farxiga 10 MG Tabs tablet Generic drug: dapagliflozin propanediol   ferrous sulfate 324 MG Tbec   metoprolol tartrate 50 MG tablet Commonly known as: LOPRESSOR   potassium chloride SA 20 MEQ tablet Commonly known as: KLOR-CON M   spironolactone 25 MG tablet Commonly known as: ALDACTONE   VITAMIN D PO       TAKE these medications    acetaminophen 325 MG tablet Commonly known as: TYLENOL Take 650-975 mg by mouth every 4 (four) hours as needed (pain).   furosemide 20 MG tablet Commonly known as: LASIX Take 1 tablet (20 mg total) by mouth daily. What changed:  medication strength how much to take how to take this when to take this additional instructions   morphine 20 MG/ML concentrated solution Commonly known as: ROXANOL Take 0.25 mLs (5 mg total) by mouth every 4 (four) hours as needed for severe pain or shortness of breath.               Discharge Care Instructions  (From admission, onward)  Start     Ordered   03/17/23 0000  Discharge wound care:       Comments: routine   03/17/23 1320   03/14/23 0000  Discharge wound care:       Comments: Pressure Injury 03/11/23 Toe (Comment  which one) Left Stage 2 -  Partial thickness loss of dermis presenting as a shallow open injury with a red, pink wound bed without slough. pressure ulcer on anterior lateral of great toe where next toe is compression     Wound care  Daily      Comments: Wound care to bilateral LEs (L>R):  Wash with soap and water, particularly between digits. Use gauze to gently cleanse between digits. Rinse and dry gently but thoroughly. Apply single layer of xeroform gauze [employer] [place] # 294) to areas of weeping. Top with ABD pad. Secure with Kerlix roll gauze applied from just below toes to just below knee/secure with  paper tape. Top Kerlix with a 6-inch ACE bandage applied in a manner similar to Kerlix (toe to knee). Place foot (feet) into Prevalon boots.   03/14/23 1223           No Known Allergies  Contact information for after-discharge care     Destination     [facility] Preferred SNF .   Service: Skilled Nursing Contact information: [address] [place] [address] [phone]                      The results of significant diagnostics from this hospitalization (including imaging, microbiology, ancillary and laboratory) are listed below for reference.    Significant Diagnostic Studies: MR BRAIN WO CONTRAST  Result Date: 03/16/2023 CLINICAL DATA:  Initial evaluation for mental status change. EXAM: MRI HEAD WITHOUT CONTRAST TECHNIQUE: Multiplanar, multiecho pulse sequences of the brain and surrounding structures were obtained without intravenous contrast. COMPARISON:  CT from 03/12/2023. FINDINGS: Brain: Examination severely limited as the patient was unable to tolerate the full length of the exam. Additionally, provided images are severely degraded by motion artifact. Age-related cerebral atrophy with mild-to-moderate chronic microvascular ischemic disease. No visible acute or subacute ischemic infarct. Gray-white matter differentiation grossly maintained. No visible mass lesion or mass effect. No midline shift. Ventricles within normal limits for size without hydrocephalus. No visible extra-axial fluid collection. Vascular: Not well seen on this motion degraded exam. Skull and upper cervical spine: Craniocervical junction grossly within normal limits. Bone marrow signal intensity grossly normal. No visible scalp soft tissue abnormality. Sinuses/Orbits: Not well assessed due to motion. Other: None. IMPRESSION: 1. Truncated and severely motion degraded exam. 2. No acute intracranial infarct. No other obvious intracranial abnormality. 3.  Age-related cerebral atrophy with mild-to-moderate chronic microvascular ischemic disease. Electronically Signed   By: Rise Mu M.D.   On: 03/16/2023 00:50   CT HEAD WO CONTRAST ( )  Result Date: 03/12/2023 CLINICAL DATA:  Mental status change, cause. EXAM: CT HEAD WITHOUT CONTRAST TECHNIQUE: Contiguous axial images were obtained from the base of the skull through the vertex without intravenous contrast. RADIATION DOSE REDUCTION: This exam was performed according to the departmental dose-optimization program which includes automated exposure control, adjustment of the mA and/or kV according to patient size and/or use of iterative reconstruction technique. COMPARISON:  CT head without contrast 12/14/2018 FINDINGS: Brain: Mild generalized atrophy and moderate white matter disease demonstrates some progression since the prior exam. No acute infarct, hemorrhage, or mass lesion is present. Deep brain nuclei are within normal limits.  The ventricles are of normal size. No significant extraaxial fluid collection is present. The brainstem and cerebellum are within normal limits. Midline structures are within normal limits. Vascular: Minimal calcifications are present within the cavernous internal carotid arteries, similar the prior exam. No hyperdense vessel is present. Skull: Calvarium is intact. No focal lytic or blastic lesions are present. No significant extracranial soft tissue lesion is present. Sinuses/Orbits: The paranasal sinuses and mastoid air cells are clear. The globes and orbits are within normal limits. IMPRESSION: 1. No acute intracranial abnormality or significant interval change. 2. Mild generalized atrophy and moderate white matter disease demonstrates some progression since the prior exam. This likely reflects the sequela of chronic microvascular ischemia. Electronically Signed   By: Marin Roberts M.D.   On: 03/12/2023 18:36   US Abdomen Complete  Result Date:  03/09/2023 CLINICAL DATA:  Elevated liver function tests EXAM: ABDOMEN ULTRASOUND COMPLETE COMPARISON:  CT 03/07/2023 FINDINGS: Gallbladder: Previous cholecystectomy. Common bile duct: Diameter: 2 mm Liver: No focal lesion identified. Within normal limits in parenchymal echogenicity. Portal vein is patent on color Doppler imaging with normal direction of blood flow towards the liver. IVC: No abnormality visualized. Pancreas: Obscured by overlapping bowel gas. Spleen: Size and appearance within normal limits. Right Kidney: Length: 9.5. Mild parenchymal atrophy. 4.5 cm hypoechoic area identified towards the lower aspect of the right kidney consistent with known mass and possible neoplasm as seen on CT. Left Kidney: Length: 9.3. Echogenicity within normal limits. No mass or hydronephrosis visualized. Abdominal aorta: Obscured by overlapping bowel gas. Other findings: Mild ascites. IMPRESSION: Previous cholecystectomy.  No ductal dilatation. Mild ascites. Mass lesion along the right kidney corresponds to the finding by CT scan and possible neoplasm on that exam. Electronically Signed   By: Karen Kays M.D.   On: 03/09/2023 12:25   ECHOCARDIOGRAM COMPLETE  Result Date: 03/08/2023    ECHOCARDIOGRAM REPORT   Patient Name:   TARAN HABLE Date of Exam: 03/08/2023 Medical Rec #:  [id number]     Height:       62.0 in Accession #:    [id number]    Weight:       136.7 lb Date of Birth:  [birth year]    BSA:          1.626 m Patient Age:    76 years      BP:           127/70 mmHg Patient Gender: F             HR:           75 bpm. Exam Location:  Inpatient Procedure: 2D Echo, Cardiac Doppler and Color Doppler Indications:    CHF  History:        Patient has prior history of Echocardiogram examinations. CHF,                 Arrythmias:Atrial Fibrillation; Risk Factors:Hypertension.  Sonographer:    Wallie Char Referring Phys: [phone] ERIC CHEN IMPRESSIONS  1. Left ventricular ejection fraction, by estimation, is 60 to 65%. The  left ventricle has normal function. The left ventricle has no regional wall motion abnormalities. Left ventricular diastolic function could not be evaluated. There is the interventricular septum is flattened in systole and diastole, consistent with right ventricular pressure and volume overload.  2. Right ventricular systolic function is moderately reduced. The right ventricular size is severely enlarged. There is moderately elevated pulmonary artery systolic pressure. The estimated right ventricular systolic pressure is 52.5  mmHg.  3. Right atrial size was severely dilated.  4. The mitral valve is degenerative. Trivial mitral valve regurgitation. Severe mitral annular calcification.  5. The tricuspid valve is abnormal. Tricuspid valve regurgitation is severe.  6. The aortic valve is tricuspid. Aortic valve regurgitation is not visualized. Aortic valve sclerosis/calcification is present, without any evidence of aortic stenosis.  7. The inferior vena cava is dilated in size with <50% respiratory variability, suggesting right atrial pressure of 15 mmHg. Comparison(s): Prior images reviewed side by side. Mitral insufficiency is less severe on the current study. The right ventricle was moderately depressed on the previous study as well. FINDINGS  Left Ventricle: Left ventricular ejection fraction, by estimation, is 60 to 65%. The left ventricle has normal function. The left ventricle has no regional wall motion abnormalities. The left ventricular internal cavity size was normal in size. There is  no left ventricular hypertrophy. The interventricular septum is flattened in systole and diastole, consistent with right ventricular pressure and volume overload. Left ventricular diastolic function could not be evaluated due to atrial fibrillation. Left ventricular diastolic function could not be evaluated. Right Ventricle: The right ventricular size is severely enlarged. No increase in right ventricular wall thickness. Right  ventricular systolic function is moderately reduced. There is moderately elevated pulmonary artery systolic pressure. The tricuspid regurgitant velocity is 3.06 m/s, and with an assumed right atrial pressure of 15 mmHg, the estimated right ventricular systolic pressure is 52.5 mmHg. Left Atrium: Left atrial size was normal in size. Right Atrium: Right atrial size was severely dilated. Prominent Eustachian valve. Pericardium: Trivial pericardial effusion is present. Mitral Valve: The mitral valve is degenerative in appearance. Severe mitral annular calcification. Trivial mitral valve regurgitation. MV peak gradient, 4.8 mmHg. The mean mitral valve gradient is 1.0 mmHg. Tricuspid Valve: The tricuspid valve is abnormal. Tricuspid valve regurgitation is severe. Aortic Valve: The aortic valve is tricuspid. Aortic valve regurgitation is not visualized. Aortic valve sclerosis/calcification is present, without any evidence of aortic stenosis. Aortic valve mean gradient measures 4.2 mmHg. Aortic valve peak gradient measures 7.5 mmHg. Aortic valve area, by VTI measures 1.99 cm. Pulmonic Valve: The pulmonic valve was normal in structure. Pulmonic valve regurgitation is trivial. Aorta: The aortic root and ascending aorta are structurally normal, with no evidence of dilitation. Venous: The inferior vena cava is dilated in size with less than 50% respiratory variability, suggesting right atrial pressure of 15 mmHg. IAS/Shunts: No atrial level shunt detected by color flow Doppler.  LEFT VENTRICLE PLAX 2D LVIDd:         3.80 cm     Diastology LVIDs:         2.50 cm     LV e' medial:    6.27 cm/s LV PW:         0.90 cm     LV E/e' medial:  16.1 LV IVS:        0.70 cm     LV e' lateral:   10.10 cm/s LVOT diam:     1.70 cm     LV E/e' lateral: 10.0 LV SV:         50 LV SV Index:   31 LVOT Area:     2.27 cm  LV Volumes (MOD) LV vol d, MOD A2C: 55.2 ml LV vol d, MOD A4C: 39.4 ml LV vol s, MOD A2C: 19.8 ml LV vol s, MOD A4C: 14.5 ml LV  SV MOD A2C:     35.4 ml LV SV MOD A4C:  39.4 ml LV SV MOD BP:      30.3 ml RIGHT VENTRICLE            IVC RV Basal diam:  3.60 cm    IVC diam: 2.60 cm RV S prime:     8.82 cm/s TAPSE (M-mode): 1.3 cm LEFT ATRIUM             Index        RIGHT ATRIUM           Index LA diam:        3.10 cm 1.91 cm/m   RA Area:     25.90 cm LA Vol (A2C):   32.0 ml 19.68 ml/m  RA Volume:   89.70 ml  55.16 ml/m LA Vol (A4C):   32.6 ml 20.05 ml/m LA Biplane Vol: 32.6 ml 20.05 ml/m  AORTIC VALVE AV Area (Vmax):    1.84 cm AV Area (Vmean):   1.75 cm AV Area (VTI):     1.99 cm AV Vmax:           136.50 cm/s AV Vmean:          94.450 cm/s AV VTI:            0.254 m AV Peak Grad:      7.5 mmHg AV Mean Grad:      4.2 mmHg LVOT Vmax:         110.40 cm/s LVOT Vmean:        72.900 cm/s LVOT VTI:          0.222 m LVOT/AV VTI ratio: 0.87  AORTA Ao Root diam: 3.00 cm Ao Asc diam:  3.10 cm MITRAL VALVE                TRICUSPID VALVE MV Area (PHT): 4.83 cm     TR Peak grad:   37.5 mmHg MV Area VTI:   2.04 cm     TR Mean grad:   25.0 mmHg MV Peak grad:  4.8 mmHg     TR Vmax:        306.00 cm/s MV Mean grad:  1.0 mmHg     TR Vmean:       241.0 cm/s MV Vmax:       1.10 m/s MV Vmean:      41.2 cm/s    SHUNTS MV Decel Time: 157 msec     Systemic VTI:  0.22 m MV E velocity: 101.00 cm/s  Systemic Diam: 1.70 cm Rachelle Hora Croitoru MD Electronically signed by Thurmon Fair MD Signature Date/Time: 03/08/2023/11:52:24 AM    Final    CT CHEST ABDOMEN PELVIS W CONTRAST  Result Date: 03/07/2023 CLINICAL DATA:  Altered mental status and sepsis. Dry cough for 2 days. Bilateral leg swelling. Hypoxic. EXAM: CT CHEST, ABDOMEN, AND PELVIS WITH CONTRAST TECHNIQUE: Multidetector CT imaging of the chest, abdomen and pelvis was performed following the standard protocol during bolus administration of intravenous contrast. RADIATION DOSE REDUCTION: This exam was performed according to the departmental dose-optimization program which includes automated exposure  control, adjustment of the mA and/or kV according to patient size and/or use of iterative reconstruction technique. CONTRAST:  60mL OMNIPAQUE IOHEXOL 350 MG/ML SOLN COMPARISON:  Chest radiograph earlier today FINDINGS: CT CHEST FINDINGS Cardiovascular: Cardiomegaly with dilation of the right atrium and reflux of contrast into the hepatic veins compatible with elevated right heart pressures. Mitral annular and aortic valve calcification. Coronary artery and aortic atherosclerotic calcification. 5 mm penetrating atherosclerotic ulcer in the  aortic arch just distal to the origin of the left subclavian artery. Additional 7 mm penetrating atherosclerotic ulcer in the posterior descending thoracic aorta. No aortic aneurysm. No central pulmonary embolism. Mediastinum/Nodes: Unremarkable esophagus.  No thoracic adenopathy. Lungs/Pleura: Advanced emphysema. No focal consolidation, pleural effusion, or pneumothorax. Musculoskeletal: No acute osseous abnormality. CT ABDOMEN PELVIS FINDINGS Hepatobiliary: Hepatic steatosis. Mild nodularity of the hepatic contour. Cholecystectomy. No biliary dilation. Pancreas: Unremarkable. Spleen: Unremarkable. Adrenals/Urinary Tract: Normal adrenal glands. Bilateral cortical renal scarring. Oval heterogenous enhancing lesion in the lower pole of the right kidney measuring 3.7 x 3.0 x 4.1 cm consistent with neoplasm. No obstructing urinary calculi or hydronephrosis. Unremarkable bladder. Stomach/Bowel: Normal caliber large and small bowel. No bowel wall thickening. The appendix is not definitively visualized. Stomach is within normal limits. Vascular/Lymphatic: Advanced aortic atherosclerotic calcification. 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 Plaque at the origin of the SMA causes moderate narrowing. Reproductive: No acute abnormality.  No adnexal mass. Other: Diffuse mesenteric edema. Small volume free fluid in the pelvis, left pericolic  gutter, and about the spleen. Musculoskeletal: No acute osseous abnormality.  Body wall anasarca. IMPRESSION: 1. Cardiomegaly with dilation of the right atrium and reflux of contrast into the hepatic veins compatible with elevated right heart pressures. 2. Oval heterogenous enhancing lesion in the lower pole of the right kidney measuring up to 4.1 cm consistent with neoplasm. Recommend Urology consultation. 3. Hepatic steatosis with mild nodularity of the hepatic contour suggesting cirrhosis. 4. Diffuse mesenteric edema and small volume free fluid in the abdomen and pelvis. Body wall anasarca. 5. Aortic Atherosclerosis (ICD10-I70.0) and Emphysema (ICD10-J43.9). 6. 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 No aortic aneurysm or dissection. 7. 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 Moderate narrowing at the origin of the SMA. Electronically Signed   By: Minerva Fester M.D.   On: 03/07/2023 21:23   CT Head Wo Contrast  Result Date: 03/07/2023 CLINICAL DATA:  Mental status change, unknown cause EXAM: CT HEAD WITHOUT CONTRAST TECHNIQUE: Contiguous axial images were obtained from the base of the skull through the vertex without intravenous contrast. RADIATION DOSE REDUCTION: This exam was performed according to the departmental dose-optimization program which includes automated exposure control, adjustment of the mA and/or kV according to patient size and/or use of iterative reconstruction technique. COMPARISON:  CT head 12/14/2018 FINDINGS: Brain: No evidence of large-territorial acute infarction. No parenchymal hemorrhage. No mass lesion. No extra-axial collection. No mass effect or midline shift. No hydrocephalus. Basilar cisterns are patent. Vascular: No hyperdense vessel. Skull: No acute fracture or focal lesion. Sinuses/Orbits: Paranasal sinuses and mastoid air cells are clear. The orbits are unremarkable. Other: None. IMPRESSION: No acute  intracranial abnormality. Electronically Signed   By: Tish Frederickson M.D.   On: 03/07/2023 21:19   DG Chest Port 1 View  Result Date: 03/07/2023 CLINICAL DATA:  Altered mental status EXAM: PORTABLE CHEST 1 VIEW COMPARISON:  X-ray 12/27/2021 FINDINGS: Enlarged cardiopericardial silhouette. No consolidation, pneumothorax or effusion. No edema. Hyperinflation. Calcified aorta. Overlapping cardiac leads IMPRESSION: Hyperinflation.  Enlarged cardiopericardial silhouette. Electronically Signed   By: Karen Kays M.D.   On: 03/07/2023 17:51    Microbiology: Recent Results (from the past 240 hour(s))  SARS Coronavirus 2 by RT PCR (hospital order, performed in [facility] hospital lab) *cepheid single result test* Anterior Nasal Swab     Status: None   Collection Time: 03/07/23  4:10 PM  Specimen: Anterior Nasal Swab  Result Value Ref Range Status   SARS Coronavirus 2 by RT PCR NEGATIVE NEGATIVE Final    Comment: Performed at [facility], [address], [place], [place] [address]  Culture, blood (routine x 2)     Status: None   Collection Time: 03/07/23  4:11 PM   Specimen: BLOOD  Result Value Ref Range Status   Specimen Description BLOOD RIGHT ANTECUBITAL  Final   Special Requests   Final    BOTTLES DRAWN AEROBIC AND ANAEROBIC Blood Culture results may not be optimal due to an inadequate volume of blood received in culture bottles   Culture   Final    NO GROWTH 5 DAYS Performed at [facility], [address] [place], [place] [address]    Report Status 03/12/2023 FINAL  Final  Culture, blood (routine x 2)     Status: None (Preliminary result)   Collection Time: 03/07/23  4:16 PM   Specimen: BLOOD  Result Value Ref Range Status   Specimen Description BLOOD SITE NOT SPECIFIED  Final   Special Requests   Final    BOTTLES DRAWN AEROBIC AND ANAEROBIC Blood Culture results may not be optimal due to an inadequate volume of blood received in culture bottles   Culture  Setup Time    Final    AEROBIC BOTTLE ONLY GRAM POSITIVE RODS CRITICAL RESULT CALLED TO, READ BACK BY AND VERIFIED WITH:  C/ PHARMD C. AMEND 03/12/23 1637 A. LAFRANCE    Culture   Final    GRAM POSITIVE RODS CORRECTED ON 04/21 AT 1636: PREVIOUSLY REPORTED AS NO GROWTH 5 DAYS SENDING TO [facility] ID NOTIFIED A PAYTES PHARMD OF RESULT DELAY [id number] BY D VANHOOK Performed at [facility], [address] [place], [place] [address]    Report Status PENDING  Incomplete  MRSA Next Gen by PCR, Nasal     Status: None   Collection Time: 03/09/23  1:57 AM   Specimen: Nasal Mucosa; Nasal Swab  Result Value Ref Range Status   MRSA by PCR Next Gen NOT DETECTED NOT DETECTED Final    Comment: (NOTE) The GeneXpert MRSA Assay (FDA approved for NASAL specimens only), is one component of a comprehensive MRSA colonization surveillance program. It is not intended to diagnose MRSA infection nor to guide or monitor treatment for MRSA infections. Test performance is not FDA approved in patients less than 83 years old. Performed at [facility], [address] [place], [place] [address]      Labs: Basic Metabolic Panel: Recent Labs  Lab 03/13/23 0244 03/14/23 0328 03/14/23 1207 03/15/23 1606 03/16/23 0312  NA 137 137 138 140 141  K 4.7 5.3* 5.0 4.4 4.1  CL 98 97* 97* 96* 97*  CO2 30 29 29 30  34*  GLUCOSE 96 92 88 121* 101*  BUN 23 29* 30* 30* 29*  CREATININE 1.04* 1.04* 1.03* 1.03* 1.09*  CALCIUM 8.9 9.2 9.5 9.1 8.8*   Liver Function Tests: Recent Labs  Lab 03/15/23 1606 03/16/23 0312  AST 38 35  ALT 26 23  ALKPHOS 133* 116  BILITOT 2.5* 2.2*  PROT 6.2* 5.5*  ALBUMIN 3.0* 2.6*   No results for input(s): "LIPASE", "AMYLASE" in the last 168 hours. Recent Labs  Lab 03/15/23 1606  AMMONIA 50*   CBC: Recent Labs  Lab 03/15/23 1606 03/16/23 0312  WBC 8.1 7.3  HGB 15.1* 14.4  HCT 47.6* 44.0  MCV 90.3 89.6  PLT 205 171   Cardiac Enzymes: No results for input(s):  "CKTOTAL", "CKMB", "CKMBINDEX", "TROPONINI" in the last 168 hours. BNP: BNP (last 3 results) Recent Labs    03/07/23 1625  BNP 609.4*    ProBNP (last 3 results) No results for input(s): "PROBNP" in the last 8760 hours.  CBG: No results for input(s): "GLUCAP" in the last 168 hours.     Signed:  Zannie Cove MD.  [facility] 03/17/2023, 1:20 PM

## 2023-03-17 NOTE — Progress Notes (Signed)
Nutrition Brief Note  Chart reviewed. Pt now transitioning to comfort care.  No further nutrition interventions planned at this time.  Please re-consult as needed.   Marigene Erler, RDN, LDN  Clinical Nutrition   

## 2023-03-17 NOTE — Progress Notes (Signed)
Heart Failure Navigator Progress Note  Assessed for Heart & Vascular TOC clinic readiness.  Patient does not meet criteria due to Comfort Care per Dr. Jomarie Longs.   Navigator will sign off at this time. Rhae Hammock, BSN, RN Heart Failure Teacher, adult education Only

## 2023-03-17 NOTE — Progress Notes (Signed)
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   Noted plans for full comfort measures and transition to residential Hospice.   No further treatment indicated in the setting of her GOC with EOL transition.   ID will sign off - please call back with any questions/concerns or if we can be of further assistance.     Rexene Alberts, MSN, NP-C [facility]  [email or web address] Pager: [phone] Office: [phone] [facility] Main Line: [phone] *Secure Chat Communication Welcome

## 2023-03-17 NOTE — TOC Transition Note (Signed)
Transition of Care [facility]) - CM/SW Discharge Note   Patient Details  Name: Kristina Martin MRN: [id number] Date of Birth: 10-Nov-1946  Transition of Care [facility]) CM/SW Contact:  Carley Hammed, LCSW Phone Number: 03/17/2023, 1:05 PM   Clinical Narrative:    Pt to be transported to [facility] via [place]. Nurse to call report to [phone]   Final next level of care: Hospice Medical Facility Barriers to Discharge: Barriers Resolved   Patient Goals and CMS Choice CMS Medicare.gov Compare Post Acute Care list provided to:: Patient Represenative (must comment) ([name]) Choice offered to / list presented to : Adult Children (Patients son)  Discharge Placement                Patient chooses bed at:  [facility]) Patient to be transferred to facility by: [facility] Name of family member notified: [name] (son) Patient and family notified of of transfer: 03/17/23  Discharge Plan and Services Additional resources added to the After Visit Summary for   In-house Referral: Clinical Social Work                                   Social Determinants of Health (SDOH) Interventions SDOH Screenings   Food Insecurity: No Food Insecurity (03/17/2023)  Housing: Low Risk  (03/17/2023)  Transportation Needs: No Transportation Needs (03/17/2023)  Utilities: Not At Risk (03/17/2023)  Alcohol Screen: Low Risk  (12/28/2021)  Financial Resource Strain: Low Risk  (12/28/2021)  Tobacco Use: Medium Risk (03/08/2023)     Readmission Risk Interventions     No data to display

## 2023-03-17 NOTE — Progress Notes (Signed)
This chaplain is present for F/U spiritual care before Pt. d/c.  Both the Pt. and Pt. son-William are sleeping. The chaplain chose not disturb and share intercessory prayer for the Pt. and family.   Chaplain Stephanie Acre [phone]

## 2023-03-22 DEATH — deceased

## 2023-04-26 LAB — CULTURE, BLOOD (ROUTINE X 2)

## 2023-04-26 LAB — MISC LABCORP TEST (SEND OUT)
Labcorp test code: 183402
Labcorp test code: 182857

## 2023-10-13 IMAGING — DX DG CHEST 1V PORT
1 series · 1 of 1 positions shown · non-contrast
Comparison: None.

CLINICAL DATA: Dry cough and shortness of breath.

EXAM:
PORTABLE CHEST 1 VIEW

[chest ap]
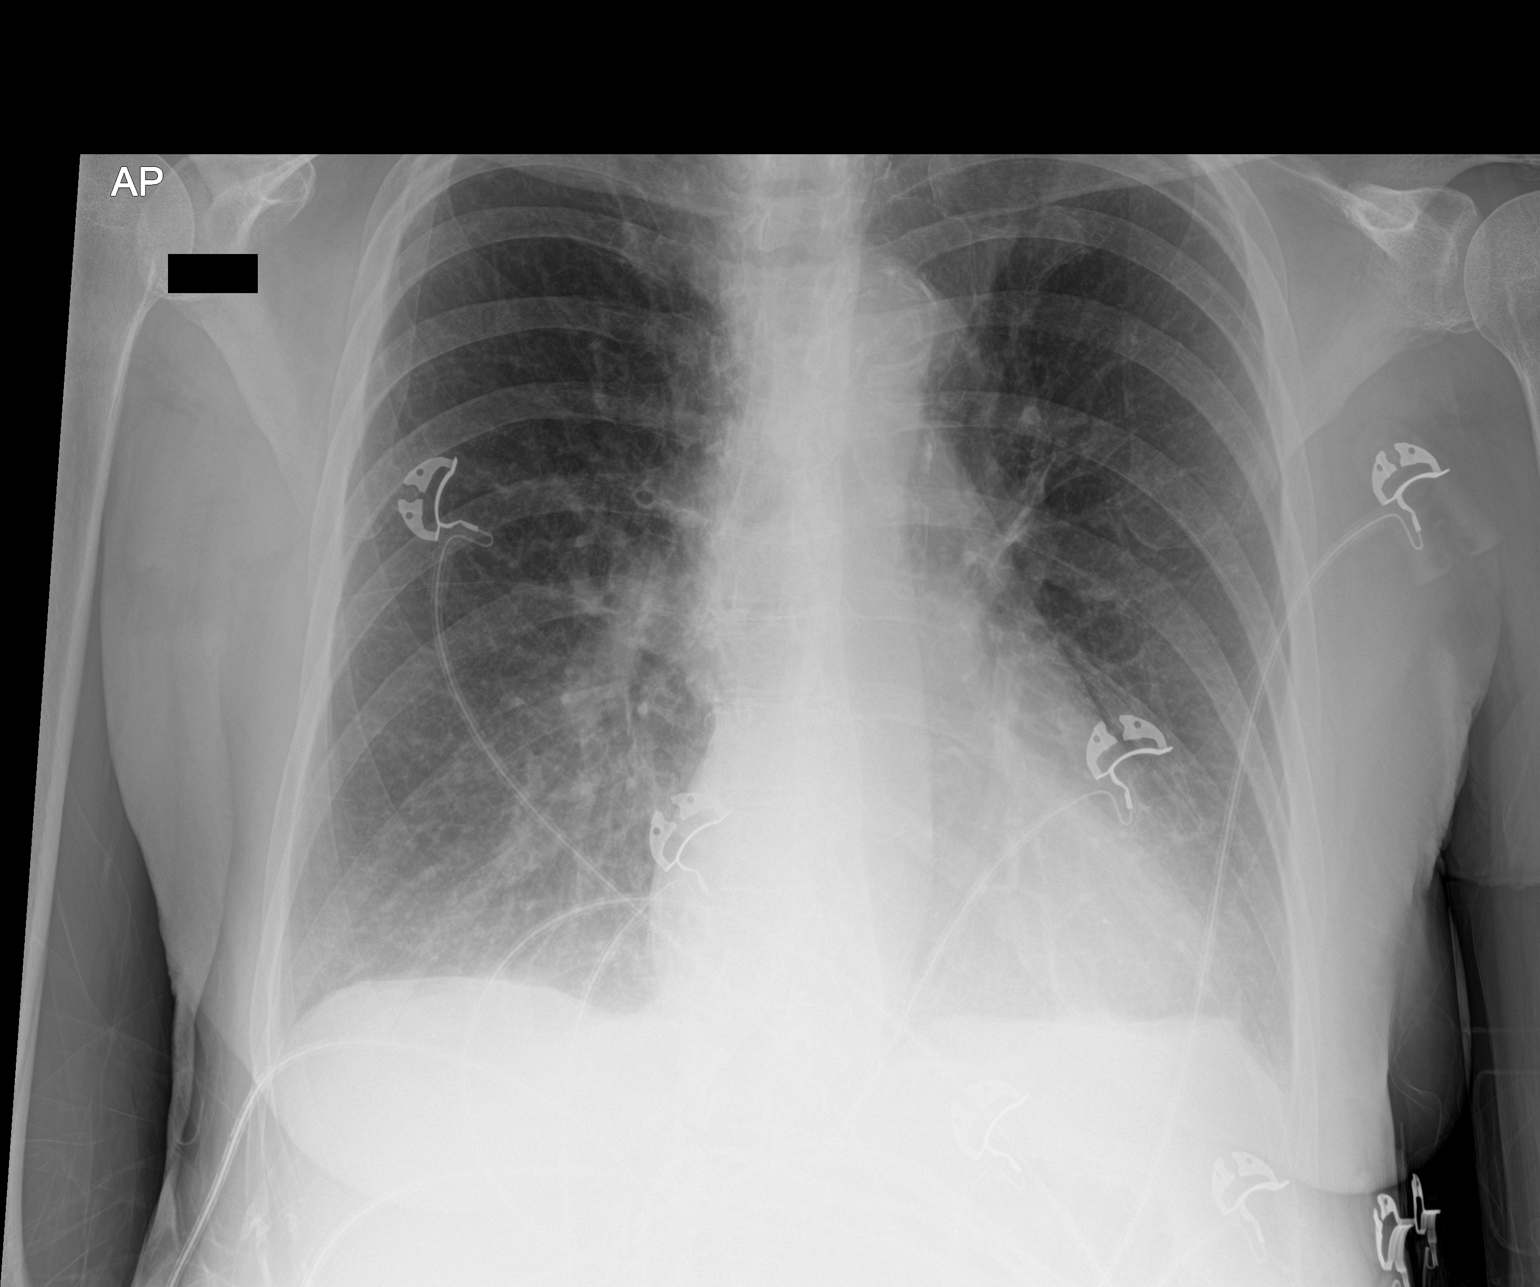

[1 of 1 positions shown; findings below may reference images not displayed]

FINDINGS: Mild cardiomegaly. Mild interstitial thickening at the right greater
than left lung bases. No consolidation, pneumothorax, or large
pleural effusion. No acute osseous abnormality.
IMPRESSION: 1. Mild cardiomegaly with mild interstitial edema.

## 2023-10-19 IMAGING — DX DG CHEST 2V
2 series · 2 of 2 positions shown · non-contrast
Comparison: Chest x-ray dated December 21, 2021.

CLINICAL DATA: Shortness of breath.

EXAM:
CHEST - 2 VIEW

[chest pa]
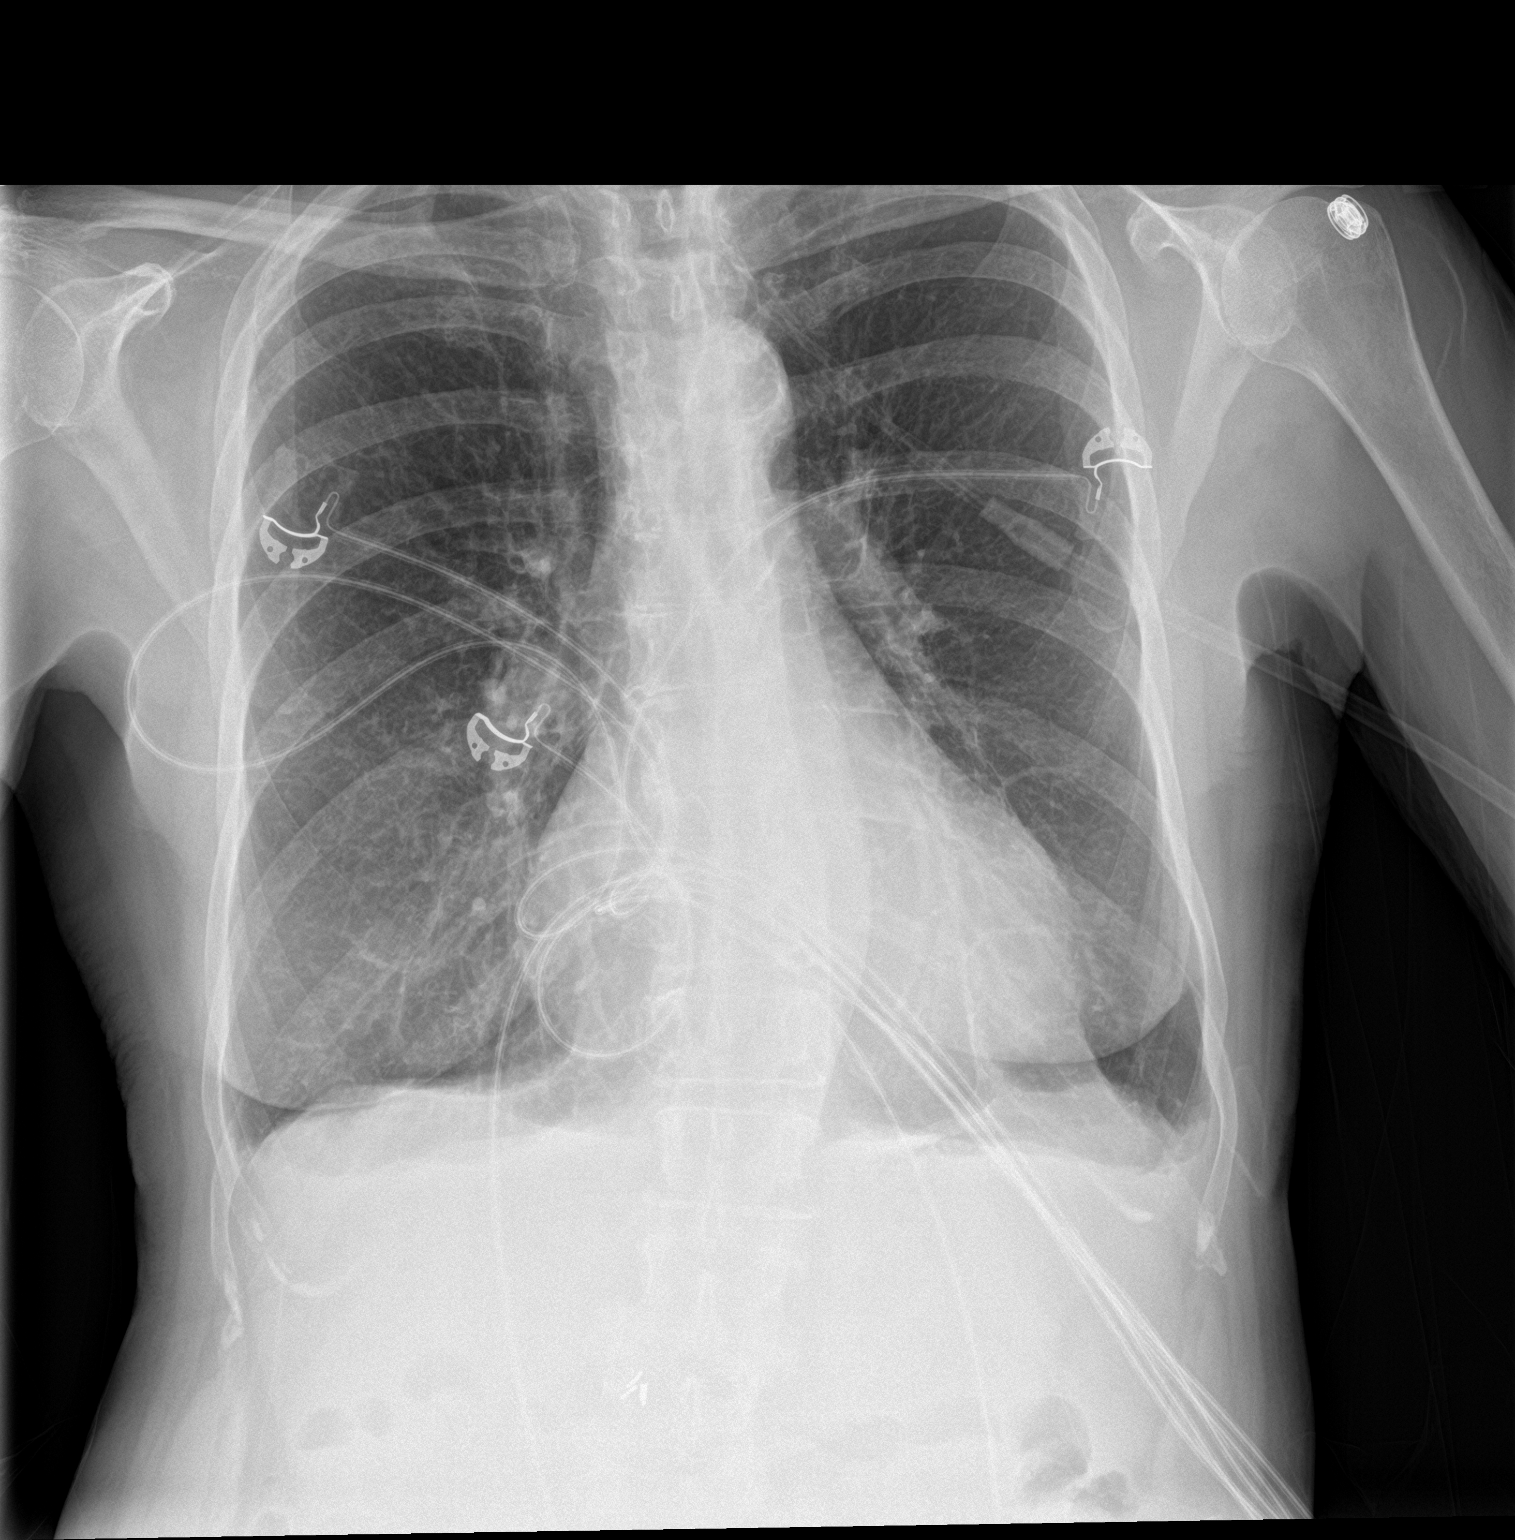

[chest lat]
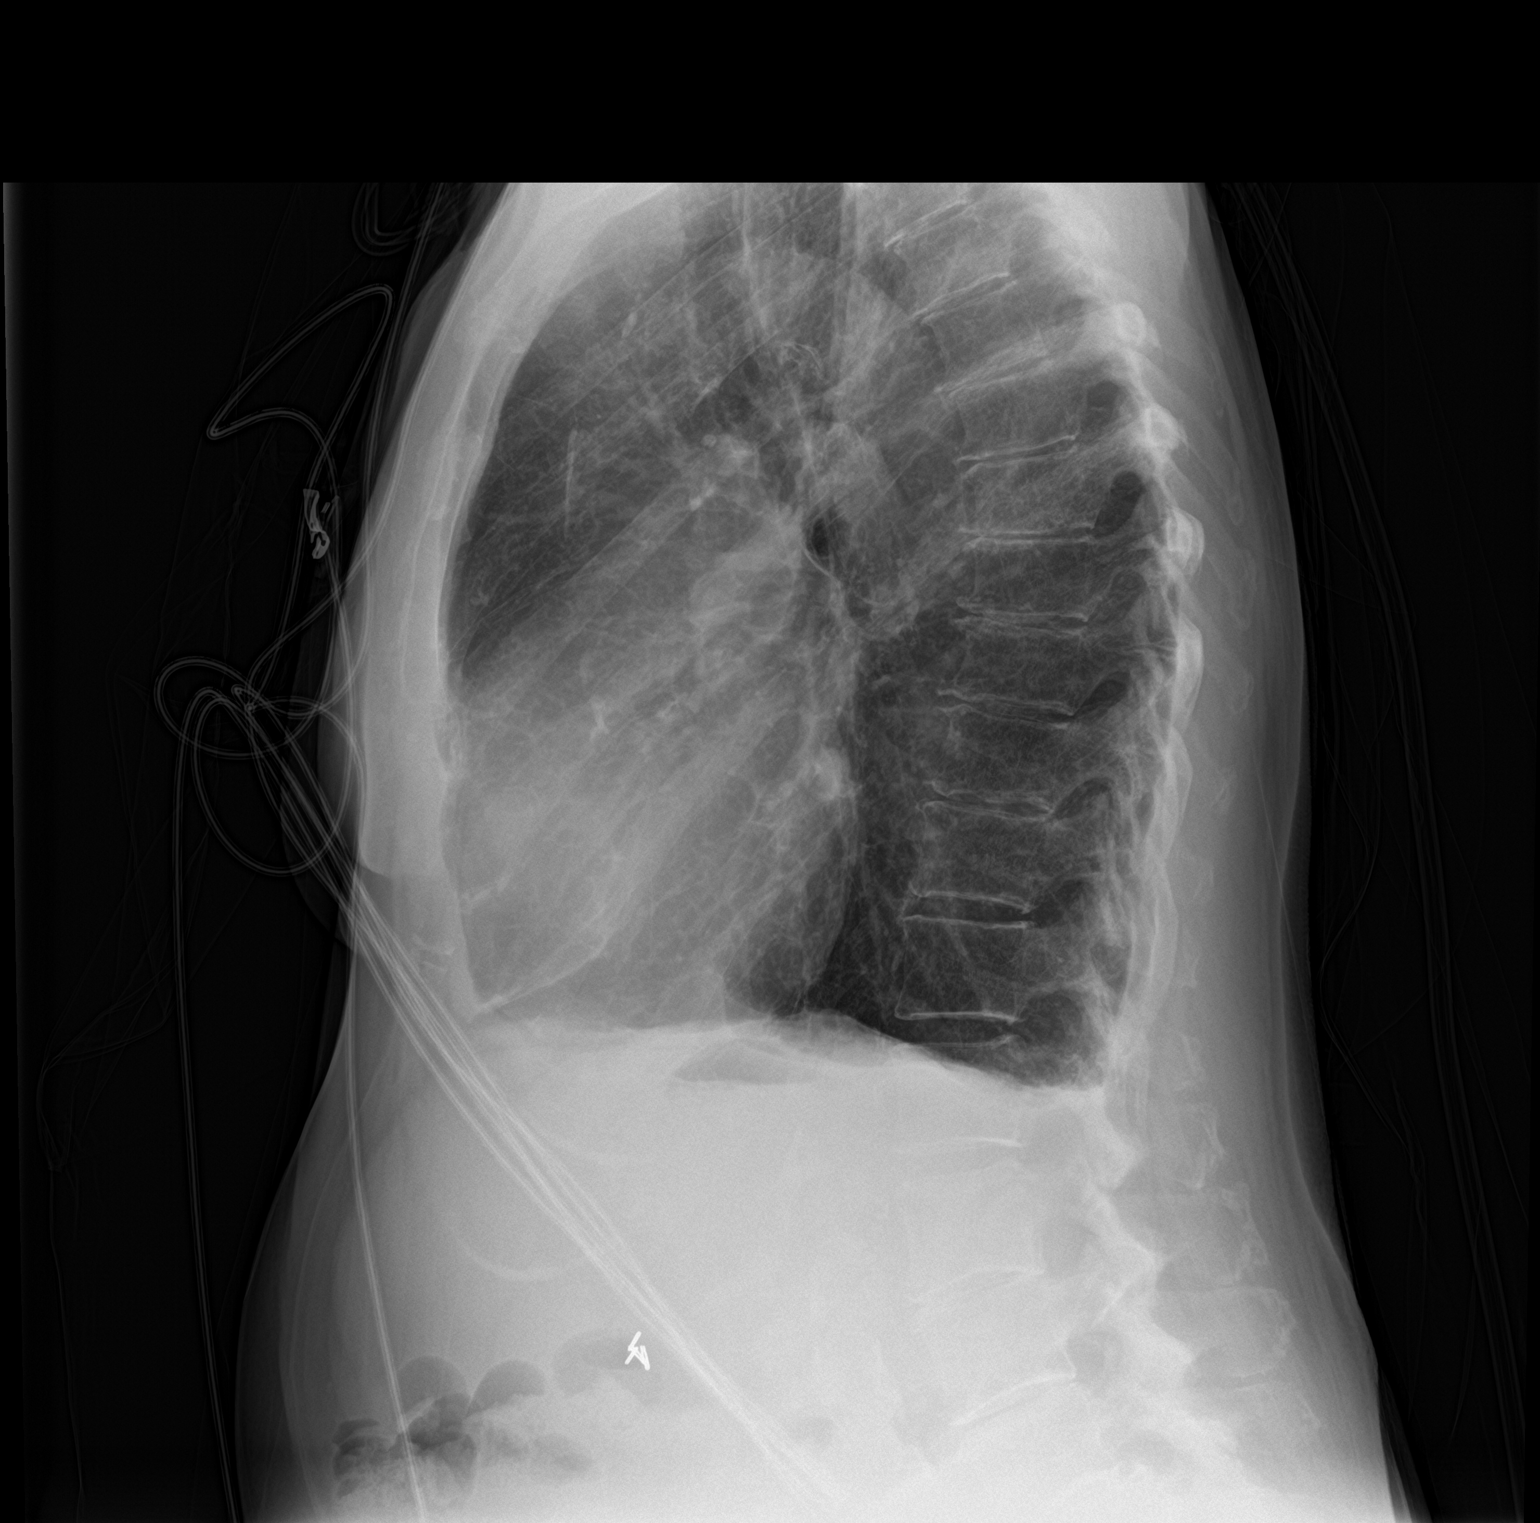

[2 of 2 positions shown; findings below may reference images not displayed]

FINDINGS: Unchanged mild cardiomegaly. The lungs are hyperinflated. Resolved
interstitial thickening at the lung bases. New trace left pleural
effusion. No consolidation or pneumothorax. No acute osseous
abnormality.
IMPRESSION: 1. New trace left pleural effusion.
2. Resolved pulmonary edema.

## 2024-06-12 ENCOUNTER — Other Ambulatory Visit: Payer: Self-pay
